# Patient Record
Sex: Female | Born: 1950 | Race: White | Hispanic: No | Smoking: Never smoker
Health system: Southern US, Community
[De-identification: ages and names within clinical notes are randomized; demographics above are authoritative.]

## PROBLEM LIST (undated history)

## (undated) DIAGNOSIS — I499 Cardiac arrhythmia, unspecified: Secondary | ICD-10-CM

## (undated) DIAGNOSIS — M797 Fibromyalgia: Secondary | ICD-10-CM

## (undated) DIAGNOSIS — F329 Major depressive disorder, single episode, unspecified: Secondary | ICD-10-CM

## (undated) DIAGNOSIS — F4310 Post-traumatic stress disorder, unspecified: Secondary | ICD-10-CM

## (undated) DIAGNOSIS — R0600 Dyspnea, unspecified: Secondary | ICD-10-CM

## (undated) DIAGNOSIS — T7840XA Allergy, unspecified, initial encounter: Secondary | ICD-10-CM

## (undated) DIAGNOSIS — F419 Anxiety disorder, unspecified: Secondary | ICD-10-CM

## (undated) DIAGNOSIS — I209 Angina pectoris, unspecified: Secondary | ICD-10-CM

## (undated) DIAGNOSIS — H269 Unspecified cataract: Secondary | ICD-10-CM

## (undated) DIAGNOSIS — C4490 Unspecified malignant neoplasm of skin, unspecified: Secondary | ICD-10-CM

## (undated) DIAGNOSIS — F32A Depression, unspecified: Secondary | ICD-10-CM

## (undated) DIAGNOSIS — R519 Headache, unspecified: Secondary | ICD-10-CM

## (undated) DIAGNOSIS — M1990 Unspecified osteoarthritis, unspecified site: Secondary | ICD-10-CM

## (undated) DIAGNOSIS — R569 Unspecified convulsions: Secondary | ICD-10-CM

## (undated) DIAGNOSIS — R51 Headache: Secondary | ICD-10-CM

## (undated) DIAGNOSIS — H409 Unspecified glaucoma: Secondary | ICD-10-CM

## (undated) DIAGNOSIS — I639 Cerebral infarction, unspecified: Secondary | ICD-10-CM

## (undated) DIAGNOSIS — C539 Malignant neoplasm of cervix uteri, unspecified: Secondary | ICD-10-CM

## (undated) DIAGNOSIS — J45909 Unspecified asthma, uncomplicated: Secondary | ICD-10-CM

## (undated) DIAGNOSIS — N809 Endometriosis, unspecified: Secondary | ICD-10-CM

## (undated) DIAGNOSIS — M719 Bursopathy, unspecified: Secondary | ICD-10-CM

## (undated) DIAGNOSIS — R011 Cardiac murmur, unspecified: Secondary | ICD-10-CM

## (undated) DIAGNOSIS — I10 Essential (primary) hypertension: Secondary | ICD-10-CM

## (undated) HISTORY — DX: Allergy, unspecified, initial encounter: T78.40XA

## (undated) HISTORY — DX: Bursopathy, unspecified: M71.9

## (undated) HISTORY — DX: Cerebral infarction, unspecified: I63.9

## (undated) HISTORY — DX: Post-traumatic stress disorder, unspecified: F43.10

## (undated) HISTORY — PX: CHOLECYSTECTOMY: SHX55

## (undated) HISTORY — DX: Anxiety disorder, unspecified: F41.9

## (undated) HISTORY — PX: OTHER SURGICAL HISTORY: SHX169

## (undated) HISTORY — DX: Fibromyalgia: M79.7

## (undated) HISTORY — DX: Endometriosis, unspecified: N80.9

## (undated) HISTORY — PX: PARTIAL HYSTERECTOMY: SHX80

## (undated) HISTORY — DX: Major depressive disorder, single episode, unspecified: F32.9

## (undated) HISTORY — DX: Unspecified convulsions: R56.9

## (undated) HISTORY — PX: SKIN CANCER EXCISION: SHX779

## (undated) HISTORY — PX: TENDON REPAIR: SHX5111

## (undated) HISTORY — DX: Malignant neoplasm of cervix uteri, unspecified: C53.9

## (undated) HISTORY — DX: Depression, unspecified: F32.A

## (undated) HISTORY — PX: BACK SURGERY: SHX140

## (undated) HISTORY — PX: COLONOSCOPY: SHX174

## (undated) HISTORY — DX: Unspecified cataract: H26.9

## (undated) HISTORY — PX: TUBAL LIGATION: SHX77

## (undated) HISTORY — DX: Cardiac arrhythmia, unspecified: I49.9

## (undated) HISTORY — DX: Unspecified glaucoma: H40.9

---

## 1998-08-14 HISTORY — PX: CERVICAL FUSION: SHX112

## 1998-10-21 ENCOUNTER — Observation Stay (HOSPITAL_COMMUNITY): Admission: RE | Admit: 1998-10-21 | Discharge: 1998-10-22 | Payer: Self-pay | Admitting: Neurosurgery

## 2000-11-07 ENCOUNTER — Encounter: Admission: RE | Admit: 2000-11-07 | Discharge: 2000-11-07 | Payer: Self-pay | Admitting: *Deleted

## 2003-01-30 ENCOUNTER — Encounter: Payer: Self-pay | Admitting: Emergency Medicine

## 2003-01-31 ENCOUNTER — Inpatient Hospital Stay (HOSPITAL_COMMUNITY): Admission: EM | Admit: 2003-01-31 | Discharge: 2003-02-02 | Payer: Self-pay | Admitting: Psychiatry

## 2003-03-02 ENCOUNTER — Emergency Department (HOSPITAL_COMMUNITY): Admission: EM | Admit: 2003-03-02 | Discharge: 2003-03-02 | Payer: Self-pay | Admitting: Emergency Medicine

## 2003-03-02 ENCOUNTER — Encounter: Payer: Self-pay | Admitting: Emergency Medicine

## 2003-08-12 ENCOUNTER — Emergency Department (HOSPITAL_COMMUNITY): Admission: EM | Admit: 2003-08-12 | Discharge: 2003-08-12 | Payer: Self-pay | Admitting: Internal Medicine

## 2003-12-04 ENCOUNTER — Ambulatory Visit (HOSPITAL_COMMUNITY): Admission: RE | Admit: 2003-12-04 | Discharge: 2003-12-04 | Payer: Self-pay | Admitting: Internal Medicine

## 2006-02-01 ENCOUNTER — Encounter: Payer: Self-pay | Admitting: Gastroenterology

## 2007-01-03 ENCOUNTER — Encounter: Admission: RE | Admit: 2007-01-03 | Discharge: 2007-02-01 | Payer: Self-pay | Admitting: Orthopedic Surgery

## 2007-02-21 ENCOUNTER — Encounter: Admission: RE | Admit: 2007-02-21 | Discharge: 2007-02-21 | Payer: Self-pay | Admitting: Orthopedic Surgery

## 2007-03-18 ENCOUNTER — Encounter: Admission: RE | Admit: 2007-03-18 | Discharge: 2007-04-22 | Payer: Self-pay | Admitting: Orthopedic Surgery

## 2008-09-23 ENCOUNTER — Ambulatory Visit: Payer: Self-pay | Admitting: Cardiology

## 2009-04-21 ENCOUNTER — Encounter: Admission: RE | Admit: 2009-04-21 | Discharge: 2009-07-20 | Payer: Self-pay | Admitting: Orthopedic Surgery

## 2009-10-20 ENCOUNTER — Encounter (INDEPENDENT_AMBULATORY_CARE_PROVIDER_SITE_OTHER): Payer: Self-pay | Admitting: *Deleted

## 2009-11-11 ENCOUNTER — Encounter: Admission: RE | Admit: 2009-11-11 | Discharge: 2010-02-09 | Payer: Self-pay | Admitting: Anesthesiology

## 2009-11-17 ENCOUNTER — Encounter (INDEPENDENT_AMBULATORY_CARE_PROVIDER_SITE_OTHER): Payer: Self-pay | Admitting: *Deleted

## 2009-11-17 ENCOUNTER — Ambulatory Visit: Payer: Self-pay | Admitting: Gastroenterology

## 2009-11-17 DIAGNOSIS — R197 Diarrhea, unspecified: Secondary | ICD-10-CM | POA: Insufficient documentation

## 2009-11-18 LAB — CONVERTED CEMR LAB
Albumin: 4.4 g/dL (ref 3.5–5.2)
Basophils Relative: 0.7 % (ref 0.0–3.0)
CO2: 33 meq/L — ABNORMAL HIGH (ref 19–32)
Eosinophils Relative: 0.8 % (ref 0.0–5.0)
GFR calc non Af Amer: 78.25 mL/min (ref >60)
Glucose, Bld: 85 mg/dL (ref 70–99)
HCT: 36.1 % (ref 36.0–46.0)
Lymphs Abs: 2.2 10*3/uL (ref 0.7–4.0)
Monocytes Relative: 5.5 % (ref 3.0–12.0)
Neutrophils Relative %: 53.3 % (ref 43.0–77.0)
Platelets: 313 10*3/uL (ref 150.0–400.0)
Potassium: 3.8 meq/L (ref 3.5–5.1)
RBC: 4.11 M/uL (ref 3.87–5.11)
Sodium: 138 meq/L (ref 135–145)
TSH: 0.81 microintl units/mL (ref 0.35–5.50)
Total Protein: 7.5 g/dL (ref 6.0–8.3)
WBC: 5.5 10*3/uL (ref 4.5–10.5)

## 2009-12-16 ENCOUNTER — Ambulatory Visit (HOSPITAL_COMMUNITY): Admission: RE | Admit: 2009-12-16 | Discharge: 2009-12-16 | Payer: Self-pay | Admitting: Gastroenterology

## 2009-12-16 ENCOUNTER — Ambulatory Visit: Payer: Self-pay | Admitting: Gastroenterology

## 2010-01-04 ENCOUNTER — Telehealth (INDEPENDENT_AMBULATORY_CARE_PROVIDER_SITE_OTHER): Payer: Self-pay | Admitting: *Deleted

## 2010-02-16 ENCOUNTER — Encounter: Payer: Self-pay | Admitting: Gastroenterology

## 2010-04-21 ENCOUNTER — Telehealth (INDEPENDENT_AMBULATORY_CARE_PROVIDER_SITE_OTHER): Payer: Self-pay | Admitting: *Deleted

## 2010-04-25 ENCOUNTER — Encounter (INDEPENDENT_AMBULATORY_CARE_PROVIDER_SITE_OTHER): Payer: Self-pay | Admitting: *Deleted

## 2010-09-13 NOTE — Letter (Signed)
Summary: New Patient letter  [facility]  [address] [place], [place] [address]   Phone: [phone]  Fax: [phone]       10/20/2009 MRN: [id number]  Carla Tran [address] [place], [place]  [address]  Dear Ms. Teague,  Welcome to the Gastroenterology Division at [facility].    You are scheduled to see Dr.  Rob Bunting on November 17, 2009 at 1:30pm on the 3rd floor at [facility], [address]  We ask that you try to arrive at our office 15 minutes prior to your appointment time to allow for check-in.  We would like you to complete the enclosed self-administered evaluation form prior to your visit and bring it with you on the day of your appointment.  We will review it with you.  Also, please bring a complete list of all your medications or, if you prefer, bring the medication bottles and we will list them.  Please bring your insurance card so that we may make a copy of it.  If your insurance requires a referral to see a specialist, please bring your referral form from your primary care physician.  Co-payments are due at the time of your visit and may be paid by cash, check or credit card.     Your office visit will consist of a consult with your physician (includes a physical exam), any laboratory testing he/she may order, scheduling of any necessary diagnostic testing (e.g. x-ray, ultrasound, CT-scan), and scheduling of a procedure (e.g. Endoscopy, Colonoscopy) if required.  Please allow enough time on your schedule to allow for any/all of these possibilities.    If you cannot keep your appointment, please call [phone] to cancel or reschedule prior to your appointment date.  This allows [place] the opportunity to schedule an appointment for another patient in need of care.  If you do not cancel or reschedule by 5 p.m. the business day prior to your appointment date, you will be charged a $50.00 late cancellation/no-show fee.    Thank you for  choosing [facility] for your medical needs.  We appreciate the opportunity to care for you.  Please visit [place] at our website  to learn more about our practice.                     Sincerely,                                                             The Gastroenterology Division

## 2010-09-13 NOTE — Procedures (Signed)
Summary: Instructions for procedure/[facility] (out pt)  Instructions for procedure/[facility] (out pt)   Imported By: Sherian Rein 11/29/2009 07:15:17  _____________________________________________________________________  External Attachment:    Type:   Image     Comment:   External Document

## 2010-09-13 NOTE — Letter (Signed)
Summary: [facility] Instructions  [facility]  [address] [place], [place] [address]   Phone: [phone]  Fax: [phone]       Carla Tran    1951-08-07    MRN: [id number]        Procedure Day /Date:12/16/09  THURS     Arrival Time:830 am     Procedure Time:1030 am     Location of Procedure:                     X  [facility] ( Outpatient Registration)                        PREPARATION FOR COLONOSCOPY WITH MOVIPREP   Starting 5 days prior to your procedure 12/11/09 do not eat nuts, seeds, popcorn, corn, beans, peas,  salads, or any raw vegetables.  Do not take any fiber supplements (e.g. Metamucil, Citrucel, and Benefiber).  THE DAY BEFORE YOUR PROCEDURE         DATE: 12/15/09  DAY: WED  1.  Drink clear liquids the entire day-NO SOLID FOOD  2.  Do not drink anything colored red or purple.  Avoid juices with pulp.  No orange juice.  3.  Drink at least 64 oz. (8 glasses) of fluid/clear liquids during the day to prevent dehydration and help the prep work efficiently.  CLEAR LIQUIDS INCLUDE: Water Jello Ice Popsicles Tea (sugar ok, no milk/cream) Powdered fruit flavored drinks Coffee (sugar ok, no milk/cream) Gatorade Juice: apple, white grape, white cranberry  Lemonade Clear bullion, consomm, broth Carbonated beverages (any kind) Strained chicken noodle soup Hard Candy                             4.  In the morning, mix first dose of MoviPrep solution:    Empty 1 Pouch A and 1 Pouch B into the disposable container    Add lukewarm drinking water to the top line of the container. Mix to dissolve    Refrigerate (mixed solution should be used within 24 hrs)  5.  Begin drinking the prep at 5:00 p.m. The MoviPrep container is divided by 4 marks.   Every 15 minutes drink the solution down to the next mark (approximately 8 oz) until the full liter is complete.   6.  Follow completed prep with 16 oz of clear liquid of your choice (Nothing  red or purple).  Continue to drink clear liquids until bedtime.  7.  Before going to bed, mix second dose of MoviPrep solution:    Empty 1 Pouch A and 1 Pouch B into the disposable container    Add lukewarm drinking water to the top line of the container. Mix to dissolve    Refrigerate  THE DAY OF YOUR PROCEDURE      DATE: 12/16/09 DAY: THURS  Beginning at 530 a.m. (5 hours before procedure):         1. Every 15 minutes, drink the solution down to the next mark (approx 8 oz) until the full liter is complete.  2. Follow completed prep with 16 oz. of clear liquid of your choice.    3. Nothing to eat or drink after midnight except prep solution.   MEDICATION INSTRUCTIONS  Unless otherwise instructed, you should take regular prescription medications with a small sip of water   as early as possible the morning of your procedure.  OTHER INSTRUCTIONS  You will need a responsible adult at least 59 years of age to accompany you and drive you home.   This person must remain in the waiting room during your procedure.  Wear loose fitting clothing that is easily removed.  Leave jewelry and other valuables at home.  However, you may wish to bring a book to read or  an iPod/MP3 player to listen to music as you wait for your procedure to start.  Remove all body piercing jewelry and leave at home.  Total time from sign-in until discharge is approximately 2-3 hours.  You should go home directly after your procedure and rest.  You can resume normal activities the  day after your procedure.  The day of your procedure you should not:   Drive   Make legal decisions   Operate machinery   Drink alcohol   Return to work  You will receive specific instructions about eating, activities and medications before you leave.    The above instructions have been reviewed and explained to me by   _______________________    I fully understand and can verbalize these instructions  _____________________________ Date _________

## 2010-09-13 NOTE — Procedures (Signed)
Summary: Colonoscopy/[facility]   Imported By: Sherian Rein 11/29/2009 07:57:34  _____________________________________________________________________  External Attachment:    Type:   Image     Comment:   External Document

## 2010-09-13 NOTE — Progress Notes (Signed)
  Phone Note Other Incoming   Request: Send information Summary of Call: Request for records received from [facility]. Request forwarded to [employer].

## 2010-09-13 NOTE — Assessment & Plan Note (Signed)
History of Present Illness Visit Type: Initial Consult Primary GI MD: Rob Bunting MD Primary Provider: Flossie Dibble, MD Requesting Provider: Karen Kays, MD Chief Complaint: ibs History of Present Illness:     60 year old woman who was sent by pain managment clinic.  She has had IBS like  symptoms for several years.  Very loose stools.  Currently she is on pain medicines for arm, back pain.  For these pains she is on hydrocodone 7.5mg  about two times a day to three times a day.  She also takes   she had a colonoscopy with Dr. Cleotis Nipper (one was in 01/2006, another further back than that.  She's never had polyps in her colon.  No colon cancer in family.  She thinks she would have 7 loose stools a day without narcotic meds or lomotil.  Never sees blood in stool.  She used to take lomotil 2 pills a day.  Her bowels were easier to control.  She currently takes about 6 immodium a day.  She had diarrhea for at least 10 years.  she has gained a about 20-25 pounds in the past year.           Current Medications (verified): 1)  Phenobarbital 60 Mg Tabs (Phenobarbital) .... 2 By Mouth Once Daily 2)  Gabapentin 100 Mg Caps (Gabapentin) .... 3 By Mouth Once Daily 3)  Cymbalta 30 Mg Cpep (Duloxetine Hcl) .... 2 By Mouth Once Daily 4)  Cyclobenzaprine Hcl 10 Mg Tabs (Cyclobenzaprine Hcl) .... 3 By Mouth Once Daily 5)  Fexofenadine Hcl 180 Mg Tabs (Fexofenadine Hcl) .Marland Kitchen.. 1 By Mouth Once Daily 6)  Xanax 1 Mg Tabs (Alprazolam) .Marland Kitchen.. 1 By Mouth Three Times A Day 7)  Voltaren 1 % Gel (Diclofenac Sodium) .... As Directed 8)  Pataday 0.2 % Soln (Olopatadine Hcl) .... As Needed 9)  Hydrocodone-Acetaminophen 7.5-325 Mg Tabs (Hydrocodone-Acetaminophen) .... Three Times A Day As Needed 10)  Cyclobenzaprine Hcl 10 Mg Tabs (Cyclobenzaprine Hcl) .... Three Times A Day  Allergies (verified): 1)  ! Penicillin 2)  ! Sulfa 3)  ! Codeine 4)  ! Percocet 5)  ! * Ivp Dye  Past History:  Past  Medical History: Irritable Bowel Syndrome Alcoholism, remote  Past Surgical History: cryo surgery Cholecystectomy Back Surgery Right arm surgery Tubal Ligation   Social History: drinks 4-5 cups of coffee and periodic tea, periodic soda no alcohol  Review of Systems       Pertinent positive and negative review of systems were noted in the above HPI and GI specific review of systems.  All other review of systems was otherwise negative.   Vital Signs:  Patient profile:   60 year old female Height:      61 inches Weight:      147.50 pounds BMI:     27.97 Pulse rate:   76 / minute Pulse rhythm:   regular BP sitting:   110 / 82  (left arm) Cuff size:   regular  Vitals Entered By: June McMurray CMA Duncan Dull) (November 17, 2009 1:28 PM)  Physical Exam  Additional Exam:  Constitutional: generally well appearing Psychiatric: alert and oriented times 3 Eyes: extraocular movements intact Mouth: oropharynx moist, no lesions Neck: supple, no lymphadenopathy Cardiovascular: heart regular rate and rythm Lungs: CTA bilaterally Abdomen: soft, non-tender, non-distended, no obvious ascites, no peritoneal signs, normal bowel sounds Extremities: no lower extremity edema bilaterally Skin: no lesions on visible extremities    Impression & Recommendations:  Problem # 1:  Chronic diarrhea she has had chronic loose stools for 8-10 years. She has undergone a colonoscopy in 2007 but I do not see that any random biopsies were taken nor was her terminal ileum intubated. There is still a possibility that she has microscopic colitis.other possibilities include hyperthyroidism, celiac sprue. I think we should repeat her colonoscopy which we will arrange be done with anesthesia's assistance given her previous sedation issue. My plan will be to perform random biopsies her colon even if it is normal. We'll also plan to intubate the terminal ileum. For her chronic loose stools a recommended she start taking  Imodium twice daily, 2 pills each time, on a scheduled basis. She should not wait until diarrhea occurs. She she'll hold this if she becomes constipated. She is taking chronic pain medicines, narcotic. This is probably also helping her loose stools. She has no abdominal pains.  Other Orders: TLB-CBC Platelet - w/Differential (85025-CBCD) TLB-CMP (Comprehensive Metabolic Pnl) (80053-COMP) TLB-TSH (Thyroid Stimulating Hormone) (84443-TSH) TLB-Sedimentation Rate (ESR) (85652-ESR) T-Tissue Transglutamase Ab IgA (04540-98119) TLB-IgA (Immunoglobulin A) (82784-IGA)  Patient Instructions: 1)  You will be scheduled to have a colonoscopy at [facility] hospital with propofol given your previous trouble with sedation. 2)  You will get lab test(s) done today (cbc, cmet, esr, tsh, tTG, total IgA). 3)  Change your immodium so that you take 2 pills every morning immediatly after waking up and then again (2 more pils) around lunch.  Immodium is safe up to 8 pills a day. 4)  A copy of this information will be sent to Dr. Rexene Edison.   and pain management Dr. Olena Leatherwood.  Appended Document: Orders Update/movi    Clinical Lists Changes  Medications: Added new medication of MOVIPREP 100 GM  SOLR (PEG-KCL-NACL-NASULF-NA ASC-C) As per prep instructions. - Signed Rx of MOVIPREP 100 GM  SOLR (PEG-KCL-NACL-NASULF-NA ASC-C) As per prep instructions.;  #1 x 0;  Signed;  Entered by: Chales Abrahams CMA (AAMA);  Authorized by: Rachael Fee MD;  Method used: Electronically to [facility] [phone]*, [address] [place], [place], [place]  [address], Ph: [phone] or [phone], Fax: [phone] Orders: Added new Test order of ZCOL (ZCOL) - Signed    Prescriptions: MOVIPREP 100 GM  SOLR (PEG-KCL-NACL-NASULF-NA ASC-C) As per prep instructions.  #1 x 0   Entered by:   Chales Abrahams CMA (AAMA)   Authorized by:   Rachael Fee MD   Signed by:   Chales Abrahams CMA (AAMA) on 11/17/2009   Method used:   Electronically to         [facility] [phone]* (retail)       [address] [place], [place]  [address]       Ph: [phone] or [phone]       Fax: [phone]   RxID:   [id number]

## 2010-09-13 NOTE — Medication Information (Signed)
Summary: Lomotil/[facility]  Lomotil/[facility]   Imported By: Sherian Rein 02/28/2010 14:09:55  _____________________________________________________________________  External Attachment:    Type:   Image     Comment:   External Document

## 2010-09-13 NOTE — Procedures (Signed)
Summary: Colonoscopy  Patient: Carla Tran Note: All result statuses are Final unless otherwise noted.  Tests: (1) Colonoscopy (COL)   COL Colonoscopy           DONE     [facility]     [address] [place], [place]  [address]           COLONOSCOPY PROCEDURE REPORT           PATIENT:  Carla, Tran  MR#:  [id number]     BIRTHDATE:  October 06, 1950, 59 yrs. old  GENDER:  female     ENDOSCOPIST:  Rachael Fee, MD     PROCEDURE DATE:  12/16/2009     PROCEDURE:  Colonoscopy with biopsy     ASA CLASS:  Class III     INDICATIONS:  chronic diarrhea     MEDICATIONS:   MAC sedation, administered by CRNA     DESCRIPTION OF PROCEDURE:   After the risks benefits and     alternatives of the procedure were thoroughly explained, informed     consent was obtained.  Digital rectal exam was performed and     revealed no rectal masses.   The  endoscope was introduced through     the anus and advanced to the cecum, which was identified by both     the appendix and ileocecal valve, limited by poor preparation.     The quality of the prep was poor, using MoviPrep.  The instrument was     then slowly withdrawn as the colon was fully     examined.<<PROCEDUREIMAGES>>     FINDINGS:  External hemorrhoids were found. These were small, not     thrombosed.  The prep was not adequate to allow appropriate     inspection of the mucosa. (see image1 and image2).  This was     otherwise a normal examination of the colon (see image3 and     image5).   Retroflexed views in the rectum revealed no     abnormalities.    The scope was then withdrawn from the patient     and the procedure completed.           COMPLICATIONS:  None           ENDOSCOPIC IMPRESSION:     1) Small external hemorrhoids     2) Poor prep limited this examination greatly     3) Otherwise normal examination; random biopsies were taken from     colon and sent to pathology.           RECOMMENDATIONS:     Await biopsy report  for final recommendations.     Likely should have a repeat colonoscopy in next 3-4 months given     poor prep today (could have missed significant lesions).           ______________________________     Rachael Fee, MD           n.     eSIGNED:   Rachael Fee at 12/16/2009 12:46 PM           [name], [id number]  Note: An exclamation mark (!) indicates a result that was not dispersed into the flowsheet. Document Creation Date: 12/16/2009 12:47 PM _______________________________________________________________________  (1) Order result status: Final Collection or observation date-time: 12/16/2009 12:29 Requested date-time:  Receipt date-time:  Reported date-time:  Referring Physician:   Ordering Physician: Rob Bunting [phone]) Specimen Source:  Source: Launa Grill Order Number: [id number] Lab site:

## 2010-09-13 NOTE — Letter (Signed)
Summary: Appointment Reminder   Gastroenterology  [address] [place], [place] [address]   Phone: [phone]  Fax: [phone]        April 25, 2010 MRN: [id number]    Carla Tran [address] [place], [place]  [address]    Dear Ms. Palmisano,   We have been unable to reach you by phone to schedule a follow up   procedure that was recommended for you by Dr. Christella Hartigan. It is very   important that we reach you to schedule an appointment. We hope that you  allow [place] to participate in your health care needs. Please contact us at  [phone] at your earliest convenience to schedule your appointment.     Sincerely,    Chales Abrahams CMA (AAMA)  Appended Document: Appointment Reminder letter mailed

## 2010-09-13 NOTE — Progress Notes (Signed)
Summary: repeat procedure  Phone Note Outgoing Call   Call placed by: Chales Abrahams CMA Duncan Dull),  April 21, 2010 1:07 PM Summary of Call: pt needs repeat procedure left message on machine to call back  Initial call taken by: Chales Abrahams CMA Duncan Dull),  April 21, 2010 1:07 PM  Follow-up for Phone Call        left message on machine to call back letter mailed Follow-up by: Chales Abrahams CMA Duncan Dull),  April 25, 2010 10:35 AM

## 2010-09-15 ENCOUNTER — Telehealth: Payer: Self-pay | Admitting: Gastroenterology

## 2010-09-21 NOTE — Progress Notes (Signed)
Summary: prep ?  Phone Note Call from Patient Call back at Home Phone [phone]   Caller: Patient Call For: Dr Christella Hartigan Reason for Call: Talk to Nurse Summary of Call: Patient wants to speak to nurse regarding new prep she needs prescribed Initial call taken by: Tawni Levy,  September 15, 2010 12:34 PM  Follow-up for Phone Call        pt has been scheduled for colon at [facility] with special prep she has previsit appt scheduled as well.  Pt would like to have lomotil refilled to last until colon.  Dr Christella Hartigan is this ok? Follow-up by: Chales Abrahams CMA Duncan Dull),  September 15, 2010 1:00 PM  Additional Follow-up for Phone Call Additional follow up Details #1::        yes, that is ok Additional Follow-up by: Rachael Fee MD,  September 15, 2010 1:45 PM    Prescriptions: LOMOTIL 2.5-0.025 MG TABS (DIPHENOXYLATE-ATROPINE) 1 by mouth every 5-6 hours as needed  #90 x 0   Entered by:   Chales Abrahams CMA (AAMA)   Authorized by:   Rachael Fee MD   Signed by:   Chales Abrahams CMA (AAMA) on 09/15/2010   Method used:   Printed then faxed to ...       [facility] [phone]* (retail)       [address] [place], [place]  [address]       Ph: [phone] or [phone]       Fax: [phone]   RxID:   [id number]  pt aware rx faxed

## 2010-10-03 ENCOUNTER — Encounter (INDEPENDENT_AMBULATORY_CARE_PROVIDER_SITE_OTHER): Payer: Self-pay | Admitting: *Deleted

## 2010-10-07 ENCOUNTER — Encounter: Payer: Self-pay | Admitting: Gastroenterology

## 2010-10-11 NOTE — Miscellaneous (Signed)
Summary: [facility]  Clinical Lists Changes  Medications: Added new medication of MOVIPREP 100 GM  SOLR (PEG-KCL-NACL-NASULF-NA ASC-C) As per prep instructions. - Signed Rx of MOVIPREP 100 GM  SOLR (PEG-KCL-NACL-NASULF-NA ASC-C) As per prep instructions.;  #1 x 0;  Signed;  Entered by: Ezra Sites RN;  Authorized by: Rachael Fee MD;  Method used: Electronically to [facility] [phone]*, [address] [place], [place], [place]  [address], Ph: [phone] or [phone], Fax: [phone] Allergies: Removed allergy or adverse reaction of PERCOCET    Prescriptions: MOVIPREP 100 GM  SOLR (PEG-KCL-NACL-NASULF-NA ASC-C) As per prep instructions.  #1 x 0   Entered by:   Ezra Sites RN   Authorized by:   Rachael Fee MD   Signed by:   Ezra Sites RN on 10/07/2010   Method used:   Electronically to        [facility] [phone]* (retail)       [address] [place], [place]  [address]       Ph: [phone] or [phone]       Fax: [phone]   RxID:   [id number]

## 2010-10-11 NOTE — Letter (Signed)
Summary: Moviprep Instructions  [facility]  [address]   [place], [place] [address]   Phone: [phone]  Fax: [phone]       Carla Tran    01-10-1951    MRN: [id number]        Procedure Day Dorna Bloom: Thursday, 10-13-10     Arrival Time: 7:15 a.m.     Procedure Time: 9:15 a.m.     Location of Procedure:                     x   [facility] ( Outpatient Registration)  PREPARATION FOR COLONOSCOPY WITH MOVIPREP   Starting 5 days prior to your procedure 10-08-10 do not eat nuts, seeds, popcorn, corn, beans, peas,  salads, or any raw vegetables.  Do not take any fiber supplements (e.g. Metamucil, Citrucel, and Benefiber).   TWO DAYS BEFORE YOUR PROCEDURE     DATE: 10-11-10  DAY: TUESDAY  DRINK FULL LIQUIDS THE ENTIRE DAY TAKE 3 SCOOPS OF MIRALAX IN THE A.M. AND 3 SCOOPS IN THE P.M.    THE DAY BEFORE YOUR PROCEDURE         DATE: 10-12-10  DAY: Wednesday  1.  Drink clear liquids the entire day-NO SOLID FOOD  2.  Do not drink anything colored red or purple.  Avoid juices with pulp.  No orange juice.  3.  Drink at least 64 oz. (8 glasses) of fluid/clear liquids during the day to prevent dehydration and help the prep work efficiently.  CLEAR LIQUIDS INCLUDE: Water Jello Ice Popsicles Tea (sugar ok, no milk/cream) Powdered fruit flavored drinks Coffee (sugar ok, no milk/cream) Gatorade Juice: apple, white grape, white cranberry  Lemonade Clear bullion, consomm, broth Carbonated beverages (any kind) Strained chicken noodle soup Hard Candy                             4.  In the morning, mix first dose of MoviPrep solution:    Empty 1 Pouch A and 1 Pouch B into the disposable container    Add lukewarm drinking water to the top line of the container. Mix to dissolve    Refrigerate (mixed solution should be used within 24 hrs)  5.  Begin drinking the prep at 5:00 p.m. The MoviPrep container is divided by 4 marks.   Every 15 minutes drink the  solution down to the next mark (approximately 8 oz) until the full liter is complete.   6.  Follow completed prep with 16 oz of clear liquid of your choice (Nothing red or purple).  Continue to drink clear liquids until bedtime.  7.  Before going to bed, mix second dose of MoviPrep solution:    Empty 1 Pouch A and 1 Pouch B into the disposable container    Add lukewarm drinking water to the top line of the container. Mix to dissolve    Refrigerate  THE DAY OF YOUR PROCEDURE      DATE: 10-13-10  DAY: Thursday  Beginning at 4:15 a.m.  (5 hours before procedure):         1. Every 15 minutes, drink the solution down to the next mark (approx 8 oz) until the full liter is complete.  2. Follow completed prep with 16 oz. of clear liquid of your choice.    3. Nothing to eat or drink after midnight (with the exception of your prep)   MEDICATION INSTRUCTIONS  Unless otherwise  instructed, you should take regular prescription medications with a small sip of water   as early as possible the morning of your procedure.           OTHER INSTRUCTIONS  You will need a responsible adult at least 59 years of age to accompany you and drive you home.   This person must remain in the waiting room during your procedure.  Wear loose fitting clothing that is easily removed.  Leave jewelry and other valuables at home.  However, you may wish to bring a book to read or  an iPod/MP3 player to listen to music as you wait for your procedure to start.  Remove all body piercing jewelry and leave at home.  Total time from sign-in until discharge is approximately 2-3 hours.  You should go home directly after your procedure and rest.  You can resume normal activities the  day after your procedure.  The day of your procedure you should not:   Drive   Make legal decisions   Operate machinery   Drink alcohol   Return to work  You will receive specific instructions about eating, activities and  medications before you leave.    The above instructions have been reviewed and explained to me by   Ezra Sites RN  October 07, 2010 10:54 AM    I fully understand and can verbalize these instructions _____________________________ Date _________

## 2010-10-13 ENCOUNTER — Ambulatory Visit (HOSPITAL_COMMUNITY)
Admission: RE | Admit: 2010-10-13 | Discharge: 2010-10-13 | Disposition: A | Discharge status: Home / Self Care | Payer: PRIVATE HEALTH INSURANCE | Source: Ambulatory Visit | Attending: Gastroenterology | Admitting: Gastroenterology

## 2010-10-13 ENCOUNTER — Other Ambulatory Visit: Payer: Medicare Other | Admitting: Gastroenterology

## 2010-10-13 ENCOUNTER — Encounter: Payer: Self-pay | Admitting: Gastroenterology

## 2010-10-13 DIAGNOSIS — R197 Diarrhea, unspecified: Secondary | ICD-10-CM | POA: Insufficient documentation

## 2010-10-13 DIAGNOSIS — K644 Residual hemorrhoidal skin tags: Secondary | ICD-10-CM

## 2010-10-13 DIAGNOSIS — K648 Other hemorrhoids: Secondary | ICD-10-CM | POA: Insufficient documentation

## 2010-10-20 NOTE — Procedures (Signed)
Summary: Colonoscopy  Patient: [name] Note: All result statuses are Final unless otherwise noted.  Tests: (1) Colonoscopy (COL)   COL Colonoscopy           DONE     [facility]     [address] [place], [place]  [address]           COLONOSCOPY PROCEDURE REPORT           PATIENT:  Carla Tran, Carla Tran  MR#:  [id number]     BIRTHDATE:  [birth year], 60 yrs. old  GENDER:  female     ENDOSCOPIST:  Rachael Fee, MD     PROCEDURE DATE:  10/13/2010     PROCEDURE:  Diagnostic Colonoscopy     ASA CLASS:  Class II     INDICATIONS:  chronic loose stools; very poorly prepped     colonsocopy 2011 but random biopsies showed no colitis     MEDICATIONS:   MAC sedation, administered by CRNA           DESCRIPTION OF PROCEDURE:   After the risks benefits and     alternatives of the procedure were thoroughly explained, informed     consent was obtained.  Digital rectal exam was performed and     revealed no rectal masses.   The EC-[id number] ([id number]) endoscope was     introduced through the anus and advanced to the terminal ileum     which was intubated for a short distance, without limitations.     The quality of the prep was good, using MoviPrep.  The instrument     was then slowly withdrawn as the colon was fully examined.     <<PROCEDUREIMAGES>>     FINDINGS:  Internal and external Hemorrhoids were found. These     were small.  This was otherwise a normal examination of the colon     (see image001, image002, and image005).  The terminal ileum     appeared normal (see image003).   Retroflexed views in the rectum     revealed no abnormalities.    The scope was then withdrawn from     the patient and the procedure completed.     COMPLICATIONS:  None           ENDOSCOPIC IMPRESSION:     1) Internal and external hemorrhoids     2) Otherwise normal examination     3) Normal terminal ileum           RECOMMENDATIONS:     Continue twice daily lomotil, it seems to be helping your  loose     stools very well.     You should have repeat colonoscopy for colon cancer screening in     10 years.           ______________________________     Rachael Fee, MD           n.     eSIGNED:   Rachael Fee at 10/13/2010 09:39 AM           [name], [id number]  Note: An exclamation mark (!) indicates a result that was not dispersed into the flowsheet. Document Creation Date: 10/13/2010 9:40 AM _______________________________________________________________________  (1) Order result status: Final Collection or observation date-time: 10/13/2010 09:33 Requested date-time:  Receipt date-time:  Reported date-time:  Referring Physician:   Ordering Physician: Rob Bunting [phone]) Specimen Source:  Source: Launa Grill Order Number: [id number] Lab site:

## 2010-10-20 NOTE — Procedures (Signed)
Summary: Colon Prep/[facility]  Colon Prep/ GI   Imported By: Lester Hendricks 10/10/2010 10:39:42  _____________________________________________________________________  External Attachment:    Type:   Image     Comment:   External Document

## 2010-11-22 ENCOUNTER — Ambulatory Visit (HOSPITAL_COMMUNITY): Payer: Self-pay | Admitting: Psychiatry

## 2010-11-25 ENCOUNTER — Telehealth: Payer: Self-pay | Admitting: Gastroenterology

## 2010-11-25 NOTE — Telephone Encounter (Signed)
Pt of Dr Christella Hartigan with a hx of chronic diarrhea. The last 2 COLONs, 12/16/09 and 10/13/10 showed internal and external hemorrhoids, otherwise normal. Pt reports she's had diarrhea since the COLON on 10/13/10. She had been eating oatmeal only and the diarrhea was controlled, but when she tried to advance her diet the diarrhea began. She reports the diarrhea is watery and denies blood. Pt stated she is only drinking today; suggested the BRAT diet to her. Per Dr Christella Hartigan' notes on COLON, she can take Lomotil BID. She took 1 Lomotil this am and 2 more with the next stool. She also bought Imodium.  Please advise on Lomotil dose or other recommendations.] Thanks.

## 2010-11-25 NOTE — Telephone Encounter (Signed)
Pt reports she hasn't had any diarrhea since she took the last 2 Lomotil- she hasn't eaten either. Pt encouraged to follow the BRAt diet and take the lomotil bid this weekend. Call back Monday if she still has a problem. Pt verbalized understanding.

## 2010-11-25 NOTE — Telephone Encounter (Signed)
Agree with Dr. Christella Hartigan recommendations regarding Lomotil dosage. She should try this over the weekend, and is still having difficulties, may contact the office and get further advice from Dr. Christella Hartigan on Monday. Thanks

## 2010-12-06 ENCOUNTER — Other Ambulatory Visit: Payer: Self-pay | Admitting: Gastroenterology

## 2010-12-06 DIAGNOSIS — R197 Diarrhea, unspecified: Secondary | ICD-10-CM

## 2010-12-06 MED ORDER — DIPHENOXYLATE-ATROPINE 2.5-0.025 MG PO TABS: 1.0000 | ORAL_TABLET | Freq: Two times a day (BID) | ORAL | Status: DC

## 2010-12-06 NOTE — Telephone Encounter (Signed)
Dr Jacobs do you want to refill? 

## 2010-12-06 NOTE — Telephone Encounter (Signed)
One lomotil, twice daily.  Disp 60, refill 11
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
## 2011-04-05 ENCOUNTER — Ambulatory Visit (HOSPITAL_COMMUNITY): Payer: Medicare Other | Admitting: Psychiatry

## 2011-07-14 ENCOUNTER — Other Ambulatory Visit: Payer: Self-pay | Admitting: Gastroenterology

## 2012-02-02 ENCOUNTER — Other Ambulatory Visit: Payer: Self-pay | Admitting: Gastroenterology

## 2012-02-02 MED ORDER — DIPHENOXYLATE-ATROPINE 2.5-0.025 MG PO TABS: 1.0000 | ORAL_TABLET | Freq: Two times a day (BID) | ORAL | Status: DC

## 2012-02-02 NOTE — Telephone Encounter (Signed)
Yes, she can have another 1 year of lomotil, one pill twice daily, discp 60, 11 refills.  She needs rov in 1 year.

## 2012-02-02 NOTE — Telephone Encounter (Signed)
Can we refill? 

## 2012-02-02 NOTE — Telephone Encounter (Signed)
Pt aware medication prescription available to pick up at the front desk.

## 2012-08-21 ENCOUNTER — Telehealth: Payer: Self-pay

## 2012-08-21 NOTE — Telephone Encounter (Signed)
Chales Abrahams, CMA 02/02/2012 1:29 PM Signed  Pt aware medication prescription available to pick up at the front desk. Rob Bunting, MD 02/02/2012 1:24 PM Signed  Yes, she can have another 1 year of lomotil, one pill twice daily, discp 60, 11 refills. She needs rov in 1 year. Chales Abrahams, CMA 02/02/2012 1:17 PM Signed  Can we refill?          Routing History       Priority  Sent On  From  To  Message Type     02/02/2012 1:24 PM  Rachael Fee, MD  Donata Duff, CMA       02/02/2012 1:17 PM  Jossilyn Benda Luretha Rued, CMA  Rachael Fee, MD       02/02/2012 12:05 PM  Trinna Balloon, CMA         Created by     Holli Humbles on 02/02/2012 12:05 PM            Approved        Disp  Refills  Start  End    diphenoxylate-atropine (LOMOTIL) 2.5-0.025 MG per tablet  60 tablet  11  02/02/2012      Sig - Route: Take 1 tablet by mouth 2 (two) times daily. - Oral    Class: Print    Authorizing Provider: Rachael Fee, MD    Ordering User: Donata Duff, CMA                      Visit Pharmacy     [id number] - [place], [place] - [address]            Contacts         Type  Contact  Phone    02/02/2012 12:05 PM  Phone (Incoming)  Janayia, Burggraf (Self)  [phone] (H)    Needs a refill on her Lomotil [facility]          Encounter Messages     No messages in this encounter

## 2012-08-21 NOTE — Telephone Encounter (Signed)
Pt rx was at the front desk for pick up since June and was not picked up I tried to call the pt but number was disconnected

## 2012-11-25 ENCOUNTER — Other Ambulatory Visit: Payer: Self-pay | Admitting: *Deleted

## 2013-03-04 ENCOUNTER — Telehealth: Payer: Self-pay | Admitting: Gastroenterology

## 2013-03-04 NOTE — Telephone Encounter (Signed)
Pt was notified she would need an appt before any refills can be given.  I has been 2 years since she was seen last.  Pt agreed and wants to call back to schedule.  No refills given

## 2014-04-19 ENCOUNTER — Emergency Department (HOSPITAL_COMMUNITY)
Admission: EM | Admit: 2014-04-19 | Discharge: 2014-04-19 | Disposition: A | Discharge status: Home / Self Care | Payer: PRIVATE HEALTH INSURANCE | Attending: Emergency Medicine | Admitting: Emergency Medicine

## 2014-04-19 ENCOUNTER — Emergency Department (HOSPITAL_COMMUNITY): Payer: PRIVATE HEALTH INSURANCE

## 2014-04-19 ENCOUNTER — Encounter (HOSPITAL_COMMUNITY): Payer: Self-pay | Admitting: Emergency Medicine

## 2014-04-19 DIAGNOSIS — Z79899 Other long term (current) drug therapy: Secondary | ICD-10-CM | POA: Diagnosis not present

## 2014-04-19 DIAGNOSIS — Z85828 Personal history of other malignant neoplasm of skin: Secondary | ICD-10-CM | POA: Diagnosis not present

## 2014-04-19 DIAGNOSIS — W1809XA Striking against other object with subsequent fall, initial encounter: Secondary | ICD-10-CM | POA: Diagnosis not present

## 2014-04-19 DIAGNOSIS — T07XXXA Unspecified multiple injuries, initial encounter: Secondary | ICD-10-CM | POA: Diagnosis not present

## 2014-04-19 DIAGNOSIS — R509 Fever, unspecified: Secondary | ICD-10-CM | POA: Insufficient documentation

## 2014-04-19 DIAGNOSIS — G8929 Other chronic pain: Secondary | ICD-10-CM | POA: Diagnosis not present

## 2014-04-19 DIAGNOSIS — Z8739 Personal history of other diseases of the musculoskeletal system and connective tissue: Secondary | ICD-10-CM | POA: Insufficient documentation

## 2014-04-19 DIAGNOSIS — Y929 Unspecified place or not applicable: Secondary | ICD-10-CM | POA: Diagnosis not present

## 2014-04-19 DIAGNOSIS — Y9301 Activity, walking, marching and hiking: Secondary | ICD-10-CM | POA: Diagnosis not present

## 2014-04-19 DIAGNOSIS — R51 Headache: Secondary | ICD-10-CM | POA: Insufficient documentation

## 2014-04-19 DIAGNOSIS — Z880 Allergy status to penicillin: Secondary | ICD-10-CM | POA: Diagnosis not present

## 2014-04-19 DIAGNOSIS — F411 Generalized anxiety disorder: Secondary | ICD-10-CM | POA: Diagnosis not present

## 2014-04-19 DIAGNOSIS — G44329 Chronic post-traumatic headache, not intractable: Secondary | ICD-10-CM | POA: Diagnosis not present

## 2014-04-19 HISTORY — DX: Headache, unspecified: R51.9

## 2014-04-19 HISTORY — DX: Unspecified osteoarthritis, unspecified site: M19.90

## 2014-04-19 HISTORY — DX: Headache: R51

## 2014-04-19 HISTORY — DX: Unspecified malignant neoplasm of skin, unspecified: C44.90

## 2014-04-19 LAB — URINALYSIS, ROUTINE W REFLEX MICROSCOPIC
Bilirubin Urine: NEGATIVE
GLUCOSE, UA: NEGATIVE mg/dL
HGB URINE DIPSTICK: NEGATIVE
Ketones, ur: NEGATIVE mg/dL
Nitrite: NEGATIVE
Protein, ur: NEGATIVE mg/dL
SPECIFIC GRAVITY, URINE: 1.01 (ref 1.005–1.030)
Urobilinogen, UA: 0.2 mg/dL (ref 0.0–1.0)
pH: 5.5 (ref 5.0–8.0)

## 2014-04-19 LAB — CBC WITH DIFFERENTIAL/PLATELET
Basophils Absolute: 0.1 10*3/uL (ref 0.0–0.1)
Basophils Relative: 0 % (ref 0–1)
Eosinophils Absolute: 0.1 10*3/uL (ref 0.0–0.7)
Eosinophils Relative: 1 % (ref 0–5)
HCT: 35.8 % — ABNORMAL LOW (ref 36.0–46.0)
HEMOGLOBIN: 12.2 g/dL (ref 12.0–15.0)
LYMPHS ABS: 1.6 10*3/uL (ref 0.7–4.0)
LYMPHS PCT: 12 % (ref 12–46)
MCH: 29.3 pg (ref 26.0–34.0)
MCHC: 34.1 g/dL (ref 30.0–36.0)
MCV: 86.1 fL (ref 78.0–100.0)
MONOS PCT: 6 % (ref 3–12)
Monocytes Absolute: 0.9 10*3/uL (ref 0.1–1.0)
NEUTROS ABS: 11.4 10*3/uL — AB (ref 1.7–7.7)
NEUTROS PCT: 81 % — AB (ref 43–77)
Platelets: 250 10*3/uL (ref 150–400)
RBC: 4.16 MIL/uL (ref 3.87–5.11)
RDW: 14.2 % (ref 11.5–15.5)
WBC: 14.1 10*3/uL — AB (ref 4.0–10.5)

## 2014-04-19 LAB — RAPID URINE DRUG SCREEN, HOSP PERFORMED
AMPHETAMINES: NOT DETECTED
BENZODIAZEPINES: POSITIVE — AB
Barbiturates: NOT DETECTED
Cocaine: NOT DETECTED
Opiates: NOT DETECTED
Tetrahydrocannabinol: NOT DETECTED

## 2014-04-19 LAB — ETHANOL: ALCOHOL ETHYL (B): 11 mg/dL (ref 0–11)

## 2014-04-19 LAB — BASIC METABOLIC PANEL
Anion gap: 11 (ref 5–15)
BUN: 6 mg/dL (ref 6–23)
CO2: 25 meq/L (ref 19–32)
Calcium: 8.5 mg/dL (ref 8.4–10.5)
Chloride: 102 mEq/L (ref 96–112)
Creatinine, Ser: 0.75 mg/dL (ref 0.50–1.10)
GFR calc Af Amer: >90 mL/min (ref >90)
GFR calc non Af Amer: 89 mL/min — ABNORMAL LOW (ref >90)
GLUCOSE: 107 mg/dL — AB (ref 70–99)
POTASSIUM: 4 meq/L (ref 3.7–5.3)
SODIUM: 138 meq/L (ref 137–147)

## 2014-04-19 LAB — URINE MICROSCOPIC-ADD ON

## 2014-04-19 MED ORDER — SODIUM CHLORIDE 0.9 % IV SOLN
Freq: Once | INTRAVENOUS | Status: AC
  Administered 2014-04-19: 09:00:00 via INTRAVENOUS

## 2014-04-19 NOTE — ED Provider Notes (Signed)
CSN: [id number]     Arrival date & time 04/19/14  2979 History   First MD Initiated Contact with Patient 04/19/14 [phone]     Chief Complaint  Patient presents with  . Headache     (Consider location/radiation/quality/duration/timing/severity/associated sxs/prior Treatment) HPI   Carla Tran is a 63 y.o. female who is a poor historian, with h/o recurrent headaches, presents to the Emergency Department complaining of sudden onset of frontal headache.  She states that she woke up from sleep "sat straight up in the bed with a pounding feeling in the front of my head".  She reports having increased stress recently due to family members and reports two weeks of sleeping walking and falls.  She notes a fall approximately two weeks ago that she struck left head , lateral chest and a second fall several days later that caused left knee pain.  Patient also c/o numbness and tingling sensations to both hands and toes of both feet during onset of the headache.  She also reports intermittent sweats and fever.  She states that she took hydrocodone and xanax and laid back down "for a little while" and headache and numbness resolved.  She denies vomiting, visual changes, neck pain or stiffness.  Pt is followed by her PMD and pain management for chronic back pain   Past Medical History  Diagnosis Date  . DJD (degenerative joint disease)   . Head ache   . Skin cancer    Past Surgical History  Procedure Laterality Date  . Back surgery    . Cholecystectomy    . Abdominal hysterectomy     No family history on file. History  Substance Use Topics  . Smoking status: Never Smoker   . Smokeless tobacco: Not on file  . Alcohol Use: No   OB History   Grav Para Term Preterm Abortions TAB SAB Ect Mult Living            2     Review of Systems  Constitutional: Positive for fever and chills. Negative for activity change and appetite change.  HENT: Negative for facial swelling and trouble swallowing.   Eyes:  Negative for photophobia, pain and visual disturbance.  Respiratory: Negative for shortness of breath.   Cardiovascular: Negative for chest pain.       Left rib pain  Gastrointestinal: Negative for nausea, vomiting and abdominal pain.  Genitourinary: Negative for dysuria, frequency, hematuria and flank pain.  Musculoskeletal: Positive for arthralgias. Negative for joint swelling, neck pain and neck stiffness.       Left knee pain  Skin: Negative for rash and wound.  Neurological: Positive for headaches. Negative for dizziness, seizures, syncope, facial asymmetry, speech difficulty, weakness, light-headedness and numbness.  Psychiatric/Behavioral: Positive for sleep disturbance. Negative for confusion and decreased concentration.  All other systems reviewed and are negative.     Allergies  Codeine; Ivp dye; Oxycodone-acetaminophen; Penicillins; and Sulfonamide derivatives  Home Medications   Prior to Admission medications   Medication Sig Start Date End Date Taking? Authorizing Provider  diphenoxylate-atropine (LOMOTIL) 2.5-0.025 MG per tablet Take 1 tablet by mouth 2 (two) times daily. 02/02/12   Milus Banister, MD   BP 131/75  Pulse 105  Temp(Src) 98.2 F (36.8 C) (Oral)  Resp 16  Ht 5\' 2"  (1.575 m)  Wt 150 lb (68.04 kg)  BMI 27.43 kg/m2  SpO2 95% Physical Exam  Nursing note and vitals reviewed. Constitutional: She is oriented to person, place, and time. She appears well-developed and  well-nourished. No distress.  HENT:  Head: Normocephalic and atraumatic.  Right Ear: Tympanic membrane and ear canal normal. No mastoid tenderness. Tympanic membrane is not erythematous. No hemotympanum.  Left Ear: Tympanic membrane and ear canal normal. No mastoid tenderness. Tympanic membrane is not erythematous. No hemotympanum.  Mouth/Throat: Uvula is midline. Mucous membranes are dry. No oropharyngeal exudate, posterior oropharyngeal edema or posterior oropharyngeal erythema.  Mucous  membranes are very dry  Eyes: Conjunctivae and EOM are normal. Pupils are equal, round, and reactive to light.  Neck: Normal range of motion and full passive range of motion without pain. Neck supple. No Kernig's sign noted.  Cardiovascular: Normal rate, regular rhythm, normal heart sounds and intact distal pulses.   No murmur heard. Pulmonary/Chest: Effort normal and breath sounds normal. No respiratory distress. She has no wheezes. She has no rales.  Localized ttp of the lateral left chest wall.  No bruising, abrasions or crepitus on exam  Abdominal: Soft. She exhibits no distension and no mass. There is no tenderness. There is no rebound and no guarding.  Musculoskeletal: She exhibits tenderness.  Localized ttp of the left anterior knee.  No edema, bruising, effusion or step off deformity.  Pt has full ROM of the knee.  DP pulses brisk, distal sensation intact.  Compartments of the left LE are soft  Lymphadenopathy:    She has no cervical adenopathy.  Neurological: She is alert and oriented to person, place, and time. She has normal strength. No sensory deficit. She exhibits normal muscle tone. Coordination normal. GCS eye subscore is 4. GCS verbal subscore is 5. GCS motor subscore is 6.  Patient has occasional slurring of her words, but speech is appropriate and patient reports is her baseline.  .   Skin: Skin is warm.  patient has erythematous, confluent macular rash under bilateral breasts, no drainage or edema  Psychiatric:  Patient appears anxious    ED Course  Procedures (including critical care time) Labs Review Labs Reviewed  CBC WITH DIFFERENTIAL - Abnormal; Notable for the following:    WBC 14.1 (*)    HCT 35.8 (*)    Neutrophils Relative % 81 (*)    Neutro Abs 11.4 (*)    All other components within normal limits  BASIC METABOLIC PANEL - Abnormal; Notable for the following:    Glucose, Bld 107 (*)    GFR calc non Af Amer 89 (*)    All other components within normal  limits  URINALYSIS, ROUTINE W REFLEX MICROSCOPIC - Abnormal; Notable for the following:    APPearance HAZY (*)    Leukocytes, UA TRACE (*)    All other components within normal limits  URINE RAPID DRUG SCREEN (HOSP PERFORMED) - Abnormal; Notable for the following:    Benzodiazepines POSITIVE (*)    All other components within normal limits  URINE MICROSCOPIC-ADD ON - Abnormal; Notable for the following:    Squamous Epithelial / LPF FEW (*)    Bacteria, UA FEW (*)    All other components within normal limits  URINE CULTURE  ETHANOL    Imaging Review Dg Chest 2 View  04/19/2014   CLINICAL DATA:  Headache, fall  EXAM: CHEST  2 VIEW  COMPARISON:  None.  FINDINGS: The heart size and mediastinal contours are within normal limits. Both lungs are clear with the exception of linear right lower lobe atelectasis or scarring. Cholecystectomy clips are noted. The visualized skeletal structures are unremarkable.  IMPRESSION: No active cardiopulmonary disease.   Electronically Signed  By: Conchita Paris M.D.   On: 04/19/2014 10:22   Ct Head Wo Contrast  04/19/2014   CLINICAL DATA:  Intermittent posterior headache with mumbled speech and blurred vision. No known trauma.  EXAM: CT HEAD WITHOUT CONTRAST  TECHNIQUE: Contiguous axial images were obtained from the base of the skull through the vertex without intravenous contrast.  COMPARISON:  Head CT- 03/03/2005  FINDINGS: Gray-white differentiation is maintained. No CT evidence of acute large territory infarct. No intraparenchymal or extra-axial mass or hemorrhage. Normal size and configuration of the ventricles and basilar cisterns. No midline shift. Limited visualization of the paranasal sinuses and mastoid air cells are normal. No air-fluid levels. Regional soft tissues appear normal. No displaced calvarial fracture.  IMPRESSION: Negative noncontrast head CT.   Electronically Signed   By: Sandi Mariscal M.D.   On: 04/19/2014 10:17     EKG Interpretation None       MDM   Final diagnoses:  Chronic post-traumatic headache, not intractable  Multiple contusions    patient is well appearing, non-toxic and clinical suspicion for acute brain injury is low.  Patient has slight slurring of her words, which she states is chronic and reports that she is currently seeing a speech therapist. Patient is feeling better after IVF's and requesting discharge.  On recheck, she has been ambulated in the dept w/o difficulty, gait is steady.  She has been on the telephone trying to make arrangements to get back home.  I have advised her to f/u with her PMD in few days for recheck or to return here if needed.    Patient has also been evaluated by Dr. Sabra Heck and care plan discussed.  Pt appears stable for d/c     Karma Ansley L. Vanessa Hampden, PA-C 04/20/14 2045

## 2014-04-19 NOTE — Discharge Instructions (Signed)
Contusion A contusion is a deep bruise. Contusions happen when an injury causes bleeding under the skin. Signs of bruising include pain, puffiness (swelling), and discolored skin. The contusion may turn blue, purple, or yellow. HOME CARE   Put ice on the injured area.  Put ice in a plastic bag.  Place a towel between your skin and the bag.  Leave the ice on for 15-20 minutes, 03-04 times a day.  Only take medicine as told by your doctor.  Rest the injured area.  If possible, raise (elevate) the injured area to lessen puffiness. GET HELP RIGHT AWAY IF:   You have more bruising or puffiness.  You have pain that is getting worse.  Your puffiness or pain is not helped by medicine. MAKE SURE YOU:   Understand these instructions.  Will watch your condition.  Will get help right away if you are not doing well or get worse. Document Released: 01/17/2008 Document Revised: 10/23/2011 Document Reviewed: 06/05/2011 [facility] Patient Information 2015 [place], [place]. This information is not intended to replace advice given to you by your health care provider. Make sure you discuss any questions you have with your health care provider.  General Headache Without Cause A general headache is pain or discomfort felt around the head or neck area. The cause may not be found.  HOME CARE   Keep all doctor visits.  Only take medicines as told by your doctor.  Lie down in a dark, quiet room when you have a headache.  Keep a journal to find out if certain things bring on headaches. For example, write down:  What you eat and drink.  How much sleep you get.  Any change to your diet or medicines.  Relax by getting a massage or doing other relaxing activities.  Put ice or heat packs on the head and neck area as told by your doctor.  Lessen stress.  Sit up straight. Do not tighten (tense) your muscles.  Quit smoking if you smoke.  Lessen how much alcohol you drink.  Lessen how much  caffeine you drink, or stop drinking caffeine.  Eat and sleep on a regular schedule.  Get 7 to 9 hours of sleep, or as told by your doctor.  Keep lights dim if bright lights bother you or make your headaches worse. GET HELP RIGHT AWAY IF:   Your headache becomes really bad.  You have a fever.  You have a stiff neck.  You have trouble seeing.  Your muscles are weak, or you lose muscle control.  You lose your balance or have trouble walking.  You feel like you will pass out (faint), or you pass out.  You have really bad symptoms that are different than your first symptoms.  You have problems with the medicines given to you by your doctor.  Your medicines do not work.  Your headache feels different than the other headaches.  You feel sick to your stomach (nauseous) or throw up (vomit). MAKE SURE YOU:   Understand these instructions.  Will watch your condition.  Will get help right away if you are not doing well or get worse. Document Released: 05/09/2008 Document Revised: 10/23/2011 Document Reviewed: 07/21/2011 [facility] Patient Information 2015 [place], [place]. This information is not intended to replace advice given to you by your health care provider. Make sure you discuss any questions you have with your health care provider.

## 2014-04-19 NOTE — ED Notes (Signed)
AC provided and signed taxi voucher for transport home.

## 2014-04-19 NOTE — ED Notes (Signed)
PA at bedside.

## 2014-04-19 NOTE — ED Provider Notes (Signed)
63 year old female with a history of frequent headaches, a couple of falls over the last month that were associated with possible sleepwalking. Awoke from sleep with throbbing frontal headache and associated tingling in her arms and legs. She took her home anxiety and pain medication and her symptoms have resolved. She presented for evaluation of her headache. On exam she has normal neurologic exam with coordinated movements of all 4 extremities, cranial nerves III through XII intact, normal sensation to the arms and legs, speech is close to baseline though she states that she has a slight slurring of her words. Listening to her speaking she only slurs occasional words at the end of sentences, she is able to answer my questions appropriately and is alert and oriented x3. No signs of acute head injury, lungs and heart are normal, no peripheral edema.  Medical screening examination/treatment/procedure(s) were conducted as a shared visit with non-physician practitioner(s) and myself.  I personally evaluated the patient during the encounter.  Clinical Impression:   Final diagnoses:  Chronic post-traumatic headache, not intractable  Multiple contusions         Johnna Acosta, MD 04/22/14 2342

## 2014-04-19 NOTE — ED Notes (Signed)
PT c/o intermittent headaches to the back of her head for the past two weeks and c/o cold chills/ double vision. PT has mumbled speech but A & O x4.

## 2014-04-20 LAB — URINE CULTURE
Colony Count: NO GROWTH
Culture: NO GROWTH

## 2014-04-22 NOTE — ED Provider Notes (Signed)
Medical screening examination/treatment/procedure(s) were conducted as a shared visit with non-physician practitioner(s) and myself.  I personally evaluated the patient during the encounter  Please see my separate respective documentation pertaining to this patient encounter   Johnna Acosta, MD 04/22/14 2342

## 2014-05-17 DIAGNOSIS — G8929 Other chronic pain: Secondary | ICD-10-CM | POA: Insufficient documentation

## 2014-05-17 DIAGNOSIS — F339 Major depressive disorder, recurrent, unspecified: Secondary | ICD-10-CM | POA: Insufficient documentation

## 2014-05-17 DIAGNOSIS — M5116 Intervertebral disc disorders with radiculopathy, lumbar region: Secondary | ICD-10-CM | POA: Insufficient documentation

## 2014-05-17 DIAGNOSIS — M5030 Other cervical disc degeneration, unspecified cervical region: Secondary | ICD-10-CM | POA: Insufficient documentation

## 2014-05-17 DIAGNOSIS — F4310 Post-traumatic stress disorder, unspecified: Secondary | ICD-10-CM | POA: Insufficient documentation

## 2014-12-16 ENCOUNTER — Institutional Professional Consult (permissible substitution): Payer: Medicaid Other | Admitting: Pulmonary Disease

## 2014-12-23 ENCOUNTER — Encounter (INDEPENDENT_AMBULATORY_CARE_PROVIDER_SITE_OTHER): Payer: Self-pay

## 2014-12-23 ENCOUNTER — Encounter: Payer: Self-pay | Admitting: Pulmonary Disease

## 2014-12-23 ENCOUNTER — Ambulatory Visit (INDEPENDENT_AMBULATORY_CARE_PROVIDER_SITE_OTHER): Payer: Medicare Other | Admitting: Pulmonary Disease

## 2014-12-23 VITALS — BP 134/80 | HR 80 | Temp 98.1°F | Ht 62.0 in | Wt 164.8 lb

## 2014-12-23 DIAGNOSIS — F513 Sleepwalking [somnambulism]: Secondary | ICD-10-CM | POA: Diagnosis not present

## 2014-12-23 NOTE — Patient Instructions (Signed)
Will schedule for a sleep study to make sure you do not have sleep apnea or other sleep disorder that may be contributing to your behavior at night Will arrange followup once the results are available.

## 2014-12-23 NOTE — Progress Notes (Signed)
Subjective:    Patient ID: Carla Tran, female    DOB: 03-25-1951, 64 y.o.   MRN: [id number]  HPI The patient is a 64 -year-old female who I've been asked to see for sleep walking. The patient states she has had this since childhood, and it is only occurred intermittently since that time. However, over the last few months that his occurred every night, and she is concerned about hurting herself. She has had some falls, but has not broken anything or put herself into danger. She does have a history of PTSD and a major depressive disorder, and does take Xanax each night at bedtime. She has been noted to have loud snoring, as well as an abnormal breathing pattern during sleep. She has frequent awakenings at night, and is not rested in the mornings upon arising. She denies any significant sleepiness during the day, and her Epworth score is only 7. She admits that she has been under a lot of stress lately, her weight is up 20 pounds over the last 2 years. She also mentions that she has a history of seizures, but knows very little about it.  Sleep Questionnaire What time do you typically go to bed?( Between what hours) 8-9p 8-9p at 1547 on 12/23/14 by Inge Rise, [place] How long does it take you to fall asleep? several hours several hours at 1547 on 12/23/14 by Inge Rise, CMA How many times during the night do you wake up? 1 1 at 1547 on 12/23/14 by Inge Rise, CMA What time do you get out of bed to start your day? 0430 0430 at 1547 on 12/23/14 by Inge Rise, CMA Do you drive or operate heavy machinery in your occupation? No No at 1547 on 12/23/14 by Inge Rise, CMA How much has your weight changed (up or down) over the past two years? (In pounds) 20 lb (9.072 kg) 20 lb (9.072 kg) at 1547 on 12/23/14 by Inge Rise, CMA Have you ever had a sleep study before? No No at 1547 on 12/23/14 by Inge Rise, CMA Do you currently use CPAP? No No at 1547 on 12/23/14 by Inge Rise, CMA Do you wear oxygen at any time? No No at 1547 on 12/23/14 by Inge Rise, CMA   Review of Systems  Constitutional: Negative for fever and unexpected weight change.  HENT: Negative for congestion, dental problem, ear pain, nosebleeds, postnasal drip, rhinorrhea, sinus pressure, sneezing, sore throat and trouble swallowing.   Eyes: Negative for redness and itching.  Respiratory: Negative for cough, chest tightness, shortness of breath and wheezing.   Cardiovascular: Negative for palpitations and leg swelling.  Gastrointestinal: Negative for nausea and vomiting.  Genitourinary: Negative for dysuria.  Musculoskeletal: Negative for joint swelling.  Skin: Negative for rash.  Neurological: Negative for headaches.  Hematological: Does not bruise/bleed easily.  Psychiatric/Behavioral: Negative for dysphoric mood. The patient is not nervous/anxious.        Objective:   Physical Exam Constitutional:  Overweight female, no acute distress  HENT:  Nares patent without discharge  Oropharynx without exudate, palate and uvula are mildly elongated  Eyes:  Perrla, eomi, no scleral icterus  Neck:  No JVD, no TMG  Cardiovascular:  Normal rate, regular rhythm, no rubs or gallops.  2/6 sem        Intact distal pulses  Pulmonary :  Normal breath sounds, no stridor or respiratory distress   No rales, rhonchi, or wheezing  Abdominal:  Soft, nondistended, bowel sounds present.  No tenderness noted.   Musculoskeletal:  No lower extremity edema noted.  Lymph Nodes:  No cervical lymphadenopathy noted  Skin:  No cyanosis noted  Neurologic:  Alert, appropriate, moves all 4 extremities without obvious deficit.         Assessment & Plan:

## 2014-12-23 NOTE — Assessment & Plan Note (Addendum)
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  Most recently she has had issues every night for the last few months, but at least has not heard herself. It is unclear if this sudden increase in symptoms is related to underlying stress or psychiatric disease, and I also think we need to consider other sleep disorder such as sleep apnea or possibly nocturnal seizures given her history. I have recommended that she have a sleep study, and if nothing specific is found, I would probably treat her with clonazepam at bedtime to see if we can limit her episodes at night.

## 2015-02-03 ENCOUNTER — Other Ambulatory Visit: Payer: Self-pay | Admitting: Pulmonary Disease

## 2015-02-03 ENCOUNTER — Telehealth: Payer: Self-pay | Admitting: Pulmonary Disease

## 2015-02-03 DIAGNOSIS — F513 Sleepwalking [somnambulism]: Secondary | ICD-10-CM

## 2015-02-03 NOTE — Telephone Encounter (Signed)
FYI for Dr Lupita Shutter patient Sleep study scheduled for 7.19.16  Per last ov w/ Red Willow on 5.11.16: Patient Instructions       Will schedule for a sleep study to make sure you do not have sleep apnea or other sleep disorder that may be contributing to your behavior at night Will arrange followup once the results are available.     Will sign and forward to RA as FYI

## 2015-03-02 ENCOUNTER — Ambulatory Visit (HOSPITAL_BASED_OUTPATIENT_CLINIC_OR_DEPARTMENT_OTHER): Payer: Medicare Other | Attending: Pulmonary Disease

## 2015-03-02 ENCOUNTER — Encounter (HOSPITAL_BASED_OUTPATIENT_CLINIC_OR_DEPARTMENT_OTHER): Payer: Medicare Other

## 2015-03-02 DIAGNOSIS — F513 Sleepwalking [somnambulism]: Secondary | ICD-10-CM | POA: Diagnosis not present

## 2015-03-02 DIAGNOSIS — R0683 Snoring: Secondary | ICD-10-CM | POA: Diagnosis not present

## 2015-03-02 DIAGNOSIS — R5383 Other fatigue: Secondary | ICD-10-CM | POA: Insufficient documentation

## 2015-03-02 DIAGNOSIS — F329 Major depressive disorder, single episode, unspecified: Secondary | ICD-10-CM | POA: Insufficient documentation

## 2015-03-03 ENCOUNTER — Telehealth: Payer: Self-pay | Admitting: Pulmonary Disease

## 2015-03-03 DIAGNOSIS — F513 Sleepwalking [somnambulism]: Secondary | ICD-10-CM | POA: Diagnosis not present

## 2015-03-03 NOTE — Addendum Note (Signed)
Addended by: Rigoberto Noel on: 03/03/2015 01:21 PM   Modules accepted: Level of Service

## 2015-03-03 NOTE — Progress Notes (Signed)
Patient Name: Carla Tran, Carla Tran Date: 03/02/2015 Gender: Female D.O.B: [birth year] Age (years): 3 Referring Provider: Clance Height (inches): 62 Interpreting Physician: Kara Mead MD, ABSM Weight (lbs): 164 RPSGT: Madelon Lips BMI: 30 MRN: [id number] Neck Size: 16.00   CLINICAL INFORMATION Sleep Study Type: NPSG    Indication for sleep study: Daytime Fatigue, snoring,  Depression, h/o sleep walking with increased frequency  Epworth Sleepiness Score: 7     SLEEP STUDY TECHNIQUE As per the AASM Manual for the Scoring of Sleep and Associated Events v2.3 (April 2016) with a hypopnea requiring 4% desaturations.  The channels recorded and monitored were frontal, central and occipital EEG, electrooculogram (EOG), submentalis EMG (chin), nasal and oral airflow, thoracic and abdominal wall motion, anterior tibialis EMG, snore microphone, electrocardiogram, and pulse oximetry.  MEDICATIONS Medications self-administered by patient during sleep study : No sleep medicine administered.  SLEEP ARCHITECTURE The study was initiated at 10:59:39 PM and ended at 5:12:07 AM.  Sleep onset time was 35.7 minutes and the sleep efficiency was 89.5%. The total sleep time was 333.5 minutes.  Stage REM latency was N/A minutes.  The patient spent 3.60% of the night in stage N1 sleep, 94.45% in stage N2 sleep, 1.95% in stage N3 and 0.00% in REM.  Alpha intrusion was absent.  Supine sleep was 27.59%.  RESPIRATORY PARAMETERS The overall apnea/hypopnea index (AHI) was 1.4 per hour. There were 0 total apneas, including 0 obstructive, 0 central and 0 mixed apneas. There were 8 hypopneas and 0 RERAs.  AHI while supine was 2.6 per hour.  The mean oxygen saturation was 94.05%. The minimum SpO2 during sleep was 89.00%.  Loud snoring was noted during this study.  CARDIAC DATA The 2 lead EKG demonstrated sinus rhythm. The mean heart rate was 74.76 beats per minute. Other EKG findings include:  None.  LEG MOVEMENT DATA The total PLMS were 120 with a resulting PLMS index of 21.59. Associated arousal with leg movement index was 0.0 .  IMPRESSIONS No significant obstructive sleep apnea occurred during this study (AHI = 1.4/h). No significant central sleep apnea occurred during this study (CAI = 0.0/h). The patient had minimal or no oxygen desaturation during the study (Min O2 = 89.00%) The patient snored with Loud snoring volume. No cardiac abnormalities were noted during this study. Mild periodic limb movements of sleep occurred during the study. No significant associated arousals.  DIAGNOSIS Limb movements without significant arousals. The significance is unclear Parasomnia- H/o sleep walking - but not noted during this study  RECOMMENDATIONS Avoid alcohol, sedatives and other CNS depressants that may worsen sleep apnea and disrupt normal sleep architecture. Sleep hygiene should be reviewed to assess factors that may improve sleep quality. Weight management and regular exercise should be initiated or continued if appropriate.  Kara Mead MD. Shade Flood. [facility]

## 2015-03-03 NOTE — Telephone Encounter (Signed)
No evidence of OSA or other cause of sleep walking noted during study Few limb movements but these did not cause arousals Recommend-  Observation for now

## 2015-03-04 NOTE — Telephone Encounter (Signed)
Patient says that she doesn't walk in her sleep every night, she says she has been doing this since she was 64 years old.  Patient says that a few weeks ago, she woke up with a huge black eye and blood on her face One other time, she woke up with a fractured ankle Patient says that she woke up one morning with a muddy spot on her pajamas and hole in her pajamas She is concerned about this because she says that another provider (couldn't remember name) told her that it is possible that she could drive in her sleep  Wants to know if there are any other tests that can be performed to try to catch her walking in her sleep?  RA - please advise.

## 2015-03-08 NOTE — Telephone Encounter (Signed)
No other testing necessary  Protective measures Use of padding on nearby furniture and on the floor beside the bed. Lowering the mattress to the floor, or using a bedroom on the ground floor in a multilevel home. Securing doors and windows with locks, a bedroom door alarm, barriers at the top of the stairs. Keep car keys away or in a  drawer  Pl schedule OV to discuss meds

## 2015-03-11 NOTE — Telephone Encounter (Signed)
Patient notified. Patient does not want to schedule appointment at this time. Nothing further needed.

## 2015-06-23 ENCOUNTER — Encounter: Payer: Self-pay | Admitting: Adult Health

## 2015-06-23 ENCOUNTER — Ambulatory Visit (INDEPENDENT_AMBULATORY_CARE_PROVIDER_SITE_OTHER): Payer: Medicare Other | Admitting: Adult Health

## 2015-06-23 VITALS — BP 122/88 | HR 82 | Temp 98.2°F | Ht 62.0 in | Wt 162.0 lb

## 2015-06-23 DIAGNOSIS — F513 Sleepwalking [somnambulism]: Secondary | ICD-10-CM
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
## 2015-06-23 NOTE — Assessment & Plan Note (Addendum)
Suspect her sleep walking and driving episodes are secondary to oversedation/narcotics along w/ benzos  Combination of oxycodone, xanax and "sleeping pill " are extemely dangerous combination . She also is on neurontin , benadryl and cymbalta which can have sedating effects may be contributing factor.  Is good that she is off oxycodone and sleeping pill . Asked her to avoid benadryl as well.  Would be good to further taper slowly off xanax if possible.  Sleep study did not show OSA or significant PLMS .  Will refer to neurology per her request for further evaluation.     Plan  Refer to Neurology .  Your sleep study does not show sleep apnea.  Avoid sedating medications if possible  Avoid sleeping pills.  No alcohol .  Avoid benadryl .  Do not drive if sleepy.  Follow up with our sleep clinic As needed

## 2015-06-23 NOTE — Patient Instructions (Addendum)
Refer to Neurology .  Your sleep study does not show sleep apnea.  Avoid sedating medications if possible  Avoid sleeping pills.  No alcohol .  Avoid benadryl .  Do not drive if sleepy.  Follow up with our sleep clinic As needed

## 2015-06-24 NOTE — Progress Notes (Signed)
Reviewed and agree with assessment/plan. 

## 2015-08-11 ENCOUNTER — Ambulatory Visit: Payer: Medicare Other | Admitting: Neurology

## 2017-02-22 ENCOUNTER — Telehealth: Payer: Self-pay | Admitting: Gastroenterology

## 2017-02-22 NOTE — Telephone Encounter (Signed)
The pt has not been seen in this office since 2012.  She was advised to call her PCP and I offered to set her up an appt with Dr Ardis Hughs first available but pt declined.

## 2017-05-01 DIAGNOSIS — F419 Anxiety disorder, unspecified: Secondary | ICD-10-CM | POA: Insufficient documentation

## 2018-03-28 ENCOUNTER — Encounter: Payer: Self-pay | Admitting: Nurse Practitioner

## 2018-04-18 ENCOUNTER — Ambulatory Visit (INDEPENDENT_AMBULATORY_CARE_PROVIDER_SITE_OTHER): Payer: Medicare Other | Admitting: Nurse Practitioner

## 2018-04-18 ENCOUNTER — Encounter: Payer: Self-pay | Admitting: Nurse Practitioner

## 2018-04-18 VITALS — BP 110/74 | HR 84 | Ht 62.0 in | Wt 165.6 lb

## 2018-04-18 DIAGNOSIS — K5900 Constipation, unspecified: Secondary | ICD-10-CM | POA: Diagnosis not present

## 2018-04-18 MED ORDER — LINACLOTIDE 145 MCG PO CAPS: 145.0000 ug | ORAL_CAPSULE | Freq: Every day | ORAL | 1 refills | Status: DC

## 2018-04-18 NOTE — Progress Notes (Signed)
GI Provider:   Oretha Caprice, MD        Chief Complaint: bowel problems  Referring Provider:     Self referred   ASSESSMENT AND PLAN;   67 yo female with altered bowel habits. She describes alternating constipation / diarrhea. Diarrhea occurs after she is able to pass or digitally remove hard stool from rectum. I think underlying problem is constipation and diarrhea is overflow.  -first need to treat constipation. Last BM 5 days ago. Will purge bowels with Mg+ Citrate then start Linzess (if insurance will pay for it).  -I strongly advised her not to take anti-diarrheals right now. I am hopeful that treatment of constipation will stop the diarrhea. If not then will evaluate further.  -she will follow up with me in a couple of weeks. Call in interim if worsening.     HPI:     Patient is a 67 year old female with long history of altered bowel habits. She is to see Urology soon for a cystocele. In 2012, for evaluation of diarrhea,  she underwent colonoscopy with TI intubation and random colon biopsies. Exam and biopsies normal. She used to get constipated with pain meds but not longer takes pain pills but says bowel movement are all over the place. She takes metamucil everyday and uses enemas as needed. Often has to digitally disimpact herself. After removal of hard stool she gets diarrhea and starts taking imodium, sometimes as many as 8 / day. Currently she hasn't had a BM in 5 days.  No blood in stool. No abdominal pain, nausea / vomiting or other GI complaints   Past Medical History:  Diagnosis Date  . Bursitis    shoulder  . Cervical cancer ([place])   . DJD (degenerative joint disease)   . Endometriosis   . Head ache   . PTSD (post-traumatic stress disorder)   . Skin cancer   . Stroke [facility])      Past Surgical History:  Procedure Laterality Date  . ABDOMINAL HYSTERECTOMY    . BACK SURGERY    . CHOLECYSTECTOMY    . ligaments removed from elbow    . skin cancer removed     from face  . TUBAL LIGATION     No family history on file. Social History   Tobacco Use  . Smoking status: Never Smoker  Substance Use Topics  . Alcohol use: No    Alcohol/week: 0.0 standard drinks  . Drug use: No   Current Outpatient Medications  Medication Sig Dispense Refill  . ALPRAZolam (XANAX) 1 MG tablet Take 1 mg by mouth 4 (four) times daily as needed.    Marland Kitchen amitriptyline (ELAVIL) 25 MG tablet Take 25 mg by mouth daily.    Marland Kitchen BUTRANS 5 MCG/HR PTWK patch Use as directed    . clotrimazole-betamethasone (LOTRISONE) cream Apply 1 application topically daily as needed (rash).     . DULoxetine (CYMBALTA) 60 MG capsule Take 60 mg by mouth daily.     Marland Kitchen gabapentin (NEURONTIN) 300 MG capsule Take 300 mg by mouth 2 (two) times daily.    Marland Kitchen latanoprost (XALATAN) 0.005 % ophthalmic solution Place 1 drop into both eyes at bedtime.    . Multiple Vitamins-Minerals (MULTIVITAMIN WITH MINERALS) tablet Take 1 tablet by mouth daily.    . Naproxen Sodium 220 MG CAPS Take 220 mg by mouth 2 (two) times daily as needed.    . Omega-3 Fatty Acids (FISH OIL) 1000 MG CAPS Take 1 g  by mouth daily.    Marland Kitchen oxyCODONE (ROXICODONE) 15 MG immediate release tablet Take 15 mg by mouth 4 (four) times daily as needed.    Marland Kitchen PATADAY 0.2 % SOLN Place 1 drop into both eyes daily.    . SF 5000 PLUS 1.1 % CREA dental cream Take 1 application by mouth 2 (two) times daily.     No current facility-administered medications for this visit.    Allergies  Allergen Reactions  . Penicillins Anaphylaxis  . Darvon [Propoxyphene] Other (See Comments)  . Sulfonamide Derivatives Other (See Comments)    Stomach Spasms   . Codeine Itching and Rash  . Ivp Dye [Iodinated Diagnostic Agents] Rash   Review of Systems: Positive for anxiety, arthritis, back pain, depression, excessive urination, sleeping problems, voice changes. All other systems reviewed and negative except where noted in HPI.   Creatinine clearance cannot be  calculated (Patient's most recent lab result is older than the maximum 21 days allowed.)   Physical Exam:    Wt Readings from Last 3 Encounters:  06/23/15 162 lb (73.5 kg)  03/02/15 164 lb (74.4 kg)  12/23/14 164 lb 12.8 oz (74.8 kg)    BP 110/74   Pulse 84   Ht 5\' 2"  (1.575 m)   Wt 165 lb 9.6 oz (75.1 kg)   BMI 30.29 kg/m  Constitutional:  Pleasant female in no acute distress. Psychiatric: Normal mood and affect. Behavior is normal. EENT: Pupils normal.  Conjunctivae are normal. No scleral icterus. Neck supple.  Cardiovascular: Normal rate, regular rhythm. No edema Pulmonary/chest: Effort normal and breath sounds normal. No wheezing, rales or rhonchi. Abdominal: Soft, nondistended, nontender. Bowel sounds active throughout. There are no masses palpable. No hepatomegaly. Neurological: Alert and oriented to person place and time. Skin: Skin is warm and dry. No rashes noted.  Tye Savoy, NP  04/18/2018, 10:14 AM

## 2018-04-18 NOTE — Patient Instructions (Addendum)
If you are age 66 or older, your body mass index should be between 23-30. Your Body mass index is 30.29 kg/m. If this is out of the aforementioned range listed, please consider follow up with your Primary Care Provider.  If you are age 97 or younger, your body mass index should be between 19-25. Your Body mass index is 30.29 kg/m. If this is out of the aformentioned range listed, please consider follow up with your Primary Care Provider.   STOP ANTI-DIARRHEALS - NO IMODIUM, NO LOMOTIL.  Take Magnesium Citrate - bottle tomorrow.  Then take Linzess 145 mcg every morning before breakfast.  Follow up with me on May 01, 2018 at 10:30 am.  Thank you for choosing me and [facility].   Tye Savoy, NP

## 2018-04-19 ENCOUNTER — Encounter: Payer: Self-pay | Admitting: Nurse Practitioner

## 2018-04-19 NOTE — Progress Notes (Signed)
I agree with the above note, plan 

## 2018-05-01 ENCOUNTER — Ambulatory Visit: Payer: Medicare Other | Admitting: Nurse Practitioner

## 2018-05-21 ENCOUNTER — Telehealth: Payer: Self-pay | Admitting: Nurse Practitioner

## 2018-05-21 NOTE — Telephone Encounter (Signed)
Again cannot get the phone number to ring.

## 2018-05-21 NOTE — Telephone Encounter (Signed)
Patient's phone number does not ring. Busy signal. Tried calling multiple times and from different extensions. Busy signal.

## 2018-05-23 NOTE — Telephone Encounter (Signed)
Call still will not go through.

## 2018-05-27 NOTE — Telephone Encounter (Signed)
Patient did not get the Magnesium Citrate from her pharmacy. Her medications are delivered if they are prescription only. She did not realize it was not prescription. Spelled it out for her. She thanked me for my call.

## 2018-06-21 ENCOUNTER — Other Ambulatory Visit: Payer: Self-pay | Admitting: Nurse Practitioner

## 2018-07-20 ENCOUNTER — Other Ambulatory Visit: Payer: Self-pay | Admitting: Nurse Practitioner

## 2018-10-07 DIAGNOSIS — M533 Sacrococcygeal disorders, not elsewhere classified: Secondary | ICD-10-CM | POA: Insufficient documentation

## 2019-01-07 ENCOUNTER — Other Ambulatory Visit: Payer: Self-pay

## 2019-01-07 ENCOUNTER — Ambulatory Visit (INDEPENDENT_AMBULATORY_CARE_PROVIDER_SITE_OTHER): Payer: Medicare Other | Admitting: Psychiatry

## 2019-01-07 DIAGNOSIS — F324 Major depressive disorder, single episode, in partial remission: Secondary | ICD-10-CM

## 2019-01-07 MED ORDER — CITALOPRAM HYDROBROMIDE 20 MG PO TABS: 20.0000 mg | ORAL_TABLET | Freq: Every day | ORAL | 2 refills | Status: DC

## 2019-01-07 MED ORDER — DULOXETINE HCL 30 MG PO CPEP: ORAL_CAPSULE | ORAL | 2 refills | Status: DC
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
## 2019-01-29 ENCOUNTER — Ambulatory Visit (HOSPITAL_COMMUNITY): Payer: Medicare Other | Admitting: Licensed Clinical Social Worker

## 2019-01-29 DIAGNOSIS — F324 Major depressive disorder, single episode, in partial remission: Secondary | ICD-10-CM

## 2019-01-29 DIAGNOSIS — F4312 Post-traumatic stress disorder, chronic: Secondary | ICD-10-CM
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
## 2019-02-03 ENCOUNTER — Ambulatory Visit (INDEPENDENT_AMBULATORY_CARE_PROVIDER_SITE_OTHER): Payer: Medicare Other | Admitting: Licensed Clinical Social Worker

## 2019-02-03 DIAGNOSIS — F4312 Post-traumatic stress disorder, chronic: Secondary | ICD-10-CM | POA: Diagnosis not present

## 2019-02-03 DIAGNOSIS — F324 Major depressive disorder, single episode, in partial remission: Secondary | ICD-10-CM
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
## 2019-02-17 ENCOUNTER — Ambulatory Visit (INDEPENDENT_AMBULATORY_CARE_PROVIDER_SITE_OTHER): Payer: Medicare Other | Admitting: Licensed Clinical Social Worker

## 2019-02-17 DIAGNOSIS — F4312 Post-traumatic stress disorder, chronic: Secondary | ICD-10-CM

## 2019-02-17 NOTE — Progress Notes (Signed)
Virtual Visit via Telephone Note  I connected with Carla Tran on 02/17/19 at 11:00 AM EDT by telephone and verified that I am speaking with the correct person using two identifiers.   I discussed the limitations, risks, security and privacy concerns of performing an evaluation and management service by telephone and the availability of in person appointments. I also discussed with the patient that there may be a patient responsible charge related to this service. The patient expressed understanding and agreed to proceed.  I discussed the assessment and treatment plan with the patient. The patient was provided an opportunity to ask questions and all were answered. The patient agreed with the plan and demonstrated an understanding of the instructions.   The patient was advised to call back or seek an in-person evaluation if the symptoms worsen or if the condition fails to improve as anticipated.  Type of Therapy: Individual Therapy  Treatment Goals addressed:  Develop and implement effective coping skills to carry out normal responsibilities and participate constructively in relationships.   Interventions: Reality Therapy and Supportive Counseling  Summary: Carla Tran is a 68 y.o. female who presents with symptoms of irritability, difficulty concentrating, low energy, anxiety/worry thoughts.   Therapist Response:  Patient met with clinician for an individual session. Patient reports high level of stress related to neighbor asking her to do things for her and adult children being in conflict. She states that her panic attacks are happening more often due to the levels of stress, and that she needs to ask the doctor for more medication to help with that. Counselor challenged the patient to explore ways that she can reduce the stress level by making different choices. Patient and counselor explored ways that patient can choose to take better care of herself by setting limits with others. Patient  discussed the guilt she feels when she sets limits with others. Counselor facilitated functional analysis of situation with neighbor and pointed out places where patient could make a different choice. Counselor guided patient in reality testing the guilt she reports she would feel about alternative choices. Patient talked about the way her choices might make others feel. Counselor emphasized that she is not responsible for others' feelings and that when she takes on responsibility for how others' feel this adds to her stress.   Suicidal/Homicidal:  No   Recommendation and plan:  Continued weekly counseling sessions. Next session will be   Diagnosis:  Chronic Post Traumatic Stress Disorder  I provided 56 minutes of non-face-to-face time during this encounter.  Lillie Fragmin, LCSW

## 2019-02-28 ENCOUNTER — Ambulatory Visit (HOSPITAL_COMMUNITY): Payer: Medicare Other | Admitting: Psychiatry

## 2019-02-28 ENCOUNTER — Other Ambulatory Visit: Payer: Self-pay

## 2019-03-05 ENCOUNTER — Ambulatory Visit (INDEPENDENT_AMBULATORY_CARE_PROVIDER_SITE_OTHER): Payer: Medicare Other | Admitting: Licensed Clinical Social Worker

## 2019-03-05 DIAGNOSIS — F324 Major depressive disorder, single episode, in partial remission: Secondary | ICD-10-CM

## 2019-03-05 DIAGNOSIS — F4312 Post-traumatic stress disorder, chronic: Secondary | ICD-10-CM | POA: Diagnosis not present

## 2019-03-05 NOTE — Progress Notes (Signed)
Patient ID: Carla Tran, female   DOB: 1950-11-01, 68 y.o.   MRN: [id number]   Virtual Visit via Telephone Note  I connected with Carla Tran on 03/05/19 at 11:00 AM EDT by telephone and verified that I am speaking with the correct person using two identifiers.  I discussed the limitations, risks, security and privacy concerns of performing an evaluation and management service by telephone and the availability of in person appointments. I also discussed with the patient that there may be a patient responsible charge related to this service. The patient expressed understanding and agreed to proceed.  I discussed the assessment and treatment plan with the patient. The patient was provided an opportunity to ask questions and all were answered. The patient agreed with the plan and demonstrated an understanding of the instructions.   The patient was advised to call back or seek an in-person evaluation if the symptoms worsen or if the condition fails to improve as anticipated.  Type of Therapy: Individual Therapy  Treatment Goals addressed:  Develop and implement effective coping skills to carry out normal responsibilities and participate constructively in relationships.    Interventions: Reality Therapy,  Supportive Counseling  Summary: Carla Tran is a 68 y.o. female who presents with symptoms of depressed mood, worry thoughts, excessive guilt, irritability, and low self-concept.  Therapist Response:  Patient met with clinician for an individual session. Patient reports that she has been saying "no" to her daughter about babysitting, which has been stressful for her. She indicates that she will keep her granddaughter for daughter to work, but not for her to go off with friends or just to have fun. Counselor processed with patient her thoughts and feelings about setting these limits. Patient states that daughter is no longer speaking to her because of this, but patient feels good about standing up  for what is best for her. Counselor and patient explored ways to stick to boundaries that she sets. Counselor likened these new limits with daughter the boundaries patient has set with her sisters in the past. Patient pointed out that her daughter thinks she is wrong for setting those limits with her sisters. Counselor reframed boundaries as self-care and preservation. Counselor pointed out that they are also a way to teach others how to treat you, and sometimes other people do not understand that. Patient and counselor explored the way patient has made intentional choices about her relationships with various family members.    Suicidal/Homicidal:  No  Recommendation and plan:  Continued sessions every other week  Diagnosis:  Major Depressive Disorder, Recurrent, Moderate PTSD  I provided 54 minutes of non-face-to-face time during this encounter.   Lillie Fragmin, LCSW

## 2019-03-19 ENCOUNTER — Ambulatory Visit (INDEPENDENT_AMBULATORY_CARE_PROVIDER_SITE_OTHER): Payer: Medicare Other | Admitting: Psychiatry

## 2019-03-19 ENCOUNTER — Other Ambulatory Visit: Payer: Self-pay

## 2019-03-19 DIAGNOSIS — F331 Major depressive disorder, recurrent, moderate: Secondary | ICD-10-CM | POA: Diagnosis not present

## 2019-03-19 MED ORDER — DULOXETINE HCL 60 MG PO CPEP: ORAL_CAPSULE | ORAL | 3 refills | Status: DC

## 2019-03-19 NOTE — Progress Notes (Signed)
Psychiatric Initial Adult Assessment   Patient Identification: Carla Tran MRN:  [id number] Date of Evaluation:  03/19/2019 Referral Source: [name] Chief Complaint:   Visit Diagnosis: Major depression chronic  History of Present Illness:  At this time the patient is doing only fairly well.  She feels very lonely.  Importantly she has been having weekends.  During the week she is up and down and it depends upon what is going on with her daughter and her family.  I do not think she is necessarily persistently depressed I think she has a lot of circumstances that is upsetting her.  She was seeing a previous psychotherapist since I saw her who unfortunately has had and her involvement.  The patient says she thought she was helpful and like talking to her.  The patient is going to start on a new therapist this week.  Overall patient is sleeping fairly well.  She sleeps the same as she is always slept for the last year or 2.  She sleeps for 5 hours gets up watches TV goes back to sleep for a few more hours.  She does not take naps and she does not feel sleepy.  She says she is gained a lot of weight implicating that she is got a very good appetite.  Her energy and concentration seem to be normal.  The patient is not suicidal.  She drinks no alcohol.  She is having no psychosis.  Patient does describe significant pain that causes a great deal of problems.  The patient is worried that she is on too many medications.  She followed our advice and discontinued her Seroquel. Associated Signs/Symptoms: Depression Symptoms:  disturbed sleep, (Hypo) Manic Symptoms:   Anxiety Symptoms:   Psychotic Symptoms:   PTSD Symptoms:   Past Psychiatric History: Under the care of Dr. Ronnald Ramp and hospitalized for alcohol treatment  Previous Psychotropic Medications: Xanax 0.5 mg 4 times daily, Celexa 40 mg, Seroquel unclear dose  Substance Abuse History in the last 12 months:  Yes.    Consequences of  Substance Abuse:   Past Medical History:  Past Medical History:  Diagnosis Date  . Anxiety   . Bursitis    shoulder  . Cervical cancer ([place])   . Depression   . DJD (degenerative joint disease)   . Endometriosis   . Fibromyalgia   . Head ache   . PTSD (post-traumatic stress disorder)   . Skin cancer   . Stroke [facility])     Past Surgical History:  Procedure Laterality Date  . BACK SURGERY    . CHOLECYSTECTOMY    . ligaments removed from elbow    . PARTIAL HYSTERECTOMY    . skin cancer removed     from face  . TUBAL LIGATION      Family Psychiatric History:   Family History:  Family History  Problem Relation Age of Onset  . Diabetes Mother   . Prostate cancer Father   . Breast cancer Maternal Aunt   . Colon cancer Neg Hx   . Esophageal cancer Neg Hx     Social History:   Social History   Socioeconomic History  . Marital status: Divorced    Spouse name: Not on file  . Number of children: 2  . Years of education: Not on file  . Highest education level: Not on file  Occupational History  . Occupation: disabled  Social Needs  . Financial resource strain: Not on file  . Food insecurity  Worry: Not on file    Inability: Not on file  . Transportation needs    Medical: Not on file    Non-medical: Not on file  Tobacco Use  . Smoking status: Never Smoker  . Smokeless tobacco: Never Used  Substance and Sexual Activity  . Alcohol use: No    Alcohol/week: 0.0 standard drinks  . Drug use: No  . Sexual activity: Not on file  Lifestyle  . Physical activity    Days per week: Not on file    Minutes per session: Not on file  . Stress: Not on file  Relationships  . Social Herbalist on phone: Not on file    Gets together: Not on file    Attends religious service: Not on file    Active member of club or organization: Not on file    Attends meetings of clubs or organizations: Not on file    Relationship status: Not on file  Other Topics Concern  .  Not on file  Social History Narrative  . Not on file    Additional Social History:   Allergies:   Allergies  Allergen Reactions  . Penicillins Anaphylaxis  . Darvon [Propoxyphene] Other (See Comments)  . Sulfonamide Derivatives Other (See Comments)    Stomach Spasms   . Codeine Itching and Rash  . Ivp Dye [Iodinated Diagnostic Agents] Rash    Metabolic Disorder Labs: No results found for: HGBA1C, MPG No results found for: PROLACTIN No results found for: CHOL, TRIG, HDL, CHOLHDL, VLDL, LDLCALC Lab Results  Component Value Date   TSH 0.81 11/17/2009    Therapeutic Level Labs: No results found for: LITHIUM No results found for: CBMZ No results found for: VALPROATE  Current Medications: Current Outpatient Medications  Medication Sig Dispense Refill  . ALPRAZolam (XANAX) 0.5 MG tablet Take 0.5 mg by mouth 4 (four) times daily as needed for anxiety.    Marland Kitchen amitriptyline (ELAVIL) 25 MG tablet Take 25 mg by mouth daily.    . cephALEXin (KEFLEX) 500 MG capsule Take 1 capsule by mouth 2 (two) times daily.    . DULoxetine (CYMBALTA) 60 MG capsule 1  qam 30 capsule 3  . gabapentin (NEURONTIN) 300 MG capsule Take 300 mg by mouth 2 (two) times daily.    Marland Kitchen latanoprost (XALATAN) 0.005 % ophthalmic solution Place 1 drop into both eyes at bedtime.    Marland Kitchen LINZESS 145 MCG CAPS capsule TAKE (1) CAPSULE DAILY BEFORE BREAKFAST. 30 capsule 0  . Multiple Vitamins-Minerals (MULTIVITAMIN WITH MINERALS) tablet Take 1 tablet by mouth daily.    . Omega-3 Fatty Acids (FISH OIL) 1000 MG CAPS Take 1 g by mouth daily.    Marland Kitchen PATADAY 0.2 % SOLN Place 1 drop into both eyes daily.    . SF 5000 PLUS 1.1 % CREA dental cream Take 1 application by mouth 2 (two) times daily.     No current facility-administered medications for this visit.     Musculoskeletal: Strength & Muscle Tone: Normal gait & Station: normal Patient leans: N/A  Psychiatric Specialty Exam: ROS  There were no vitals taken for this  visit.There is no height or weight on file to calculate BMI.  General Appearance: Casual  Eye Contact:  Good  Speech:  Clear and Coherent  Volume:  Normal  Mood:  Negative  Affect:  Congruent  Thought Process:  Goal Directed  Orientation:  Full (Time, Place, and Person)  Thought Content:  WDL  Suicidal Thoughts:  No  Homicidal Thoughts:  No  Memory:  Negative  Judgement:  Fair  Insight:  Fair  Psychomotor Activity:  Normal  Concentration:    Recall:  Poor  Fund of Knowledge:Poor  Language: Fair  Akathisia:  No  Handed:  Right  AIMS (if indicated):  not done  Assets:  Desire for Improvement  ADL's:    Cognition: Impaired,  Mild  Sleep:  Fair   Screenings:   Assessment and Plan:  At this time her diagnosis is still a question.  The patient has significant pain and sounds like she is been treated for depression in the past.  At this time we will ask her to discontinue his her Celexa 40 mg and to continue taking her Cymbalta that she is been taking at 30 mg.  We will ask her to discontinue her Celexa and to go ahead and increase her Cymbalta to the full dose of 60 mg hopefully for pain and for depression.  She will continue taking Xanax from her primary care doctor 0.5 mg 4 times daily and continue taking gabapentin 300 mg 3 times daily probably for pain.  Patient was started in therapy and will be reevaluated in 2 and half months.   Jerral Ralph, MD 8/5/20202:30 PM

## 2019-03-24 ENCOUNTER — Ambulatory Visit (HOSPITAL_COMMUNITY): Payer: Medicare Other | Admitting: Licensed Clinical Social Worker

## 2019-04-01 ENCOUNTER — Ambulatory Visit (HOSPITAL_COMMUNITY): Payer: Medicare Other | Admitting: Licensed Clinical Social Worker

## 2019-04-01 ENCOUNTER — Other Ambulatory Visit: Payer: Self-pay

## 2019-04-02 ENCOUNTER — Other Ambulatory Visit (HOSPITAL_COMMUNITY): Payer: Self-pay | Admitting: Psychiatry

## 2019-04-11 ENCOUNTER — Telehealth (HOSPITAL_COMMUNITY): Payer: Self-pay

## 2019-04-11 NOTE — Telephone Encounter (Signed)
Patient called, she is not doing well on the increased Cymbalta. She states that she is nervous - checking things twice, confusion and she is eating more than she should. Please review and advise, thank you

## 2019-04-17 NOTE — Telephone Encounter (Signed)
I called patient back and she is agreeable to speaking to her PCP about the Xanax. Patient is also concerned about low energy and weight gain and would lik,e to know if you have any ideas how to increase her energy so that she wants to do things again. Patient states she has no desire to do housework, cook, or even leave the house.

## 2019-05-05 ENCOUNTER — Telehealth: Payer: Self-pay | Admitting: Nurse Practitioner

## 2019-05-05 NOTE — Telephone Encounter (Signed)
Pt has been dealing with constipation for a while, she would like some advise.

## 2019-05-05 NOTE — Telephone Encounter (Signed)
Patient is complaining of 2 weeks of worsening constipation. She feels this is related to some new medications she is taking, including Cymbalta and Celebrex. She denies any present use of narcotic medication other than Alprazolam. The patient was taking 1 tablespoon of Miralax 3 times daily. She has tried an enema. States she had poor return. She now has an incontinent leakage of liquid stool without passing formed stool. She has started having indigestion. Please advise.

## 2019-05-05 NOTE — Telephone Encounter (Signed)
Patient is advised.  She will call back in a couple of days with a follow up. We will schedule her appointment at that time. She has to have 5 days advanced notice to obtain transportation for appointments.

## 2019-05-05 NOTE — Telephone Encounter (Signed)
I had prescribed Linzess, is she not on that?  If having incontinence then may be overflow diarrhea from impaction. She can purge bowel with mg citrate and see if that helps. Can we get her an appointment, it has been several months since I saw her. Thanks

## 2019-05-06 ENCOUNTER — Ambulatory Visit (INDEPENDENT_AMBULATORY_CARE_PROVIDER_SITE_OTHER): Payer: Medicare Other | Admitting: Licensed Clinical Social Worker

## 2019-05-06 DIAGNOSIS — F4312 Post-traumatic stress disorder, chronic: Secondary | ICD-10-CM

## 2019-05-06 DIAGNOSIS — F331 Major depressive disorder, recurrent, moderate: Secondary | ICD-10-CM | POA: Diagnosis not present
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
## 2019-05-07 ENCOUNTER — Telehealth: Payer: Self-pay | Admitting: Gastroenterology

## 2019-05-07 NOTE — Telephone Encounter (Signed)
Carla Tran, I have not seen her in several months.  If she soiling herself she could have overflow diarrhea due to actually being constipated.  We can carry out the plan as laid forth when I saw her several months ago.  She would need to purge her bowels again with magnesium [facility] then start Linzess.  If she never got a prescription for it then I would start her at 145 mcg daily on an empty stomach.  Thank you

## 2019-05-07 NOTE — Telephone Encounter (Signed)
Spoke with the patient. She does not know what Linzess is and cannot recall having been on it.  Patient did use the Mag Citrate for her purge. She has had "stools like toothpaste." However she still feels bloated and uncomfortable. She states she is soiling her pants without knowing it. She cannot come in next week due to "surgery" with another healthcare provider. Agrees to an appointment 05/28/19. Anything you want her to try in the meantime?

## 2019-05-08 ENCOUNTER — Other Ambulatory Visit: Payer: Self-pay

## 2019-05-08 MED ORDER — LINACLOTIDE 145 MCG PO CAPS: ORAL_CAPSULE | ORAL | 0 refills | Status: DC

## 2019-05-08 NOTE — Telephone Encounter (Signed)
Discussed with the patient. She agrees to this plan of action. Rx to the [facility].

## 2019-05-28 ENCOUNTER — Encounter: Payer: Self-pay | Admitting: Gastroenterology

## 2019-05-28 ENCOUNTER — Ambulatory Visit (INDEPENDENT_AMBULATORY_CARE_PROVIDER_SITE_OTHER): Payer: Medicare Other | Admitting: Gastroenterology

## 2019-05-28 VITALS — BP 118/82 | HR 96 | Temp 98.5°F | Ht 62.0 in | Wt 167.1 lb

## 2019-05-28 DIAGNOSIS — Z1159 Encounter for screening for other viral diseases: Secondary | ICD-10-CM

## 2019-05-28 DIAGNOSIS — R198 Other specified symptoms and signs involving the digestive system and abdomen: Secondary | ICD-10-CM

## 2019-05-28 MED ORDER — LINACLOTIDE 72 MCG PO CAPS: 72.0000 ug | ORAL_CAPSULE | Freq: Every day | ORAL | 11 refills | Status: DC

## 2019-05-28 MED ORDER — PEG 3350-KCL-NA BICARB-NACL 420 G PO SOLR: 4000.0000 mL | ORAL | 0 refills | Status: DC

## 2019-05-28 NOTE — Patient Instructions (Signed)
You have been scheduled for a colonoscopy. Please follow written instructions given to you at your visit today.  Please pick up your prep supplies at the pharmacy within the next 1-3 days. If you use inhalers (even only as needed), please bring them with you on the day of your procedure. Your physician has requested that you go to www.startemmi.com and enter the access code given to you at your visit today. This web site gives a general overview about your procedure. However, you should still follow specific instructions given to you by our office regarding your preparation for the procedure.  Due to recent COVID-19 restrictions implemented by [employer] and state authorities and in an effort to keep both patients and staff as safe as possible, [facility] requires COVID-19 testing prior to any scheduled endoscopic procedure. The testing center is located at [address] [place], [place] [address] in the [facility] suite.  Your appointment has been scheduled for 06/27/19 on 9am.   Please bring your insurance cards to this appointment. You will require your COVID screen 2 business days prior to your endoscopic procedure.  You are not required to quarantine after your screening.  You will only receive a phone call with the results if it is POSITIVE.  If you do not receive a call the day before your procedure you should begin your prep, if ordered, and you should report to the endo center for your procedure at your designated appointment arrival time ( one hour prior to the procedure time). There is no cost to you for the screening on the day of the swab.  [facility] will file with your insurance company for the testing.    You may receive an automated phone call prior to your procedure or have a message in your MyChart that you have an appointment for a BP/15 at the [facility], please disregard this message.  Your testing will be at  the [address] [place] [facility] location.   Stop Linzess 181mcg   We have sent the following medications to your pharmacy for you to pick up at your convenience:Linzess 72 mcg  Please start taking Citrucel(orange flavored) powder supplement.This may cause some bloating at first,but that usually goes away.Begin with a small spoonful and work your way up to a large,heaping spoonful daily over a week  Thank you for entrusting me with your care and choosing [facility].  Dr Ardis Hughs

## 2019-05-28 NOTE — Progress Notes (Signed)
HPI: This is a pleasant 68 year old woman whom I have not seen in 8 years.  She was here however and saw Nevin Bloodgood about a year ago.  Colonoscopy 2012 Dr. Ardis Hughs for chronic diarrhea showed normal terminal ileum, normal colonic mucosa, no polyps or cancers.  Colonoscopy 2011 for the same was very poorly prepped, random colon biopsies were however performed and they were all normal, no microscopic colitis.  She was here in our office about 1 year ago and she saw Nevin Bloodgood for alternating constipation diarrhea.  She was having to digitally disimpact herself occasionally.  She was recommended to stop taking her antidiarrheal medicines.  Her bowels were purge with mag citrate and then she was to start Linzess if insurance will pay for it.  She called about 1 month later because she had not gotten her magnesium citrate from the pharmacy.  It was explained more clearly that this was over-the-counter.  It looks like she never started the [place].  A prescription for 145 mcg pills 1 pill once daily was called in again for her about 1 month ago so that she can finally start it.  Since starting the Linzess 145 mcg pills she has had a lot of loose stools.  She is woken up in the middle the night with fecal incontinence.  She has had no bleeding.  She told me she took 8 Imodium before coming here today to see me.  Her weight has been up 30 pounds in the past 6 months.  Colon cancer does not run in her family  Chief complaint is constipation alternating with diarrhea  ROS: complete GI ROS as described in HPI, all other review negative.  Constitutional:  No unintentional weight loss   Past Medical History:  Diagnosis Date  . Anxiety   . Bursitis    shoulder  . Cervical cancer ([place])   . Depression   . DJD (degenerative joint disease)   . Endometriosis   . Fibromyalgia   . Head ache   . PTSD (post-traumatic stress disorder)   . Skin cancer   . Stroke [facility])     Past Surgical History:  Procedure  Laterality Date  . BACK SURGERY    . CHOLECYSTECTOMY    . ligaments removed from elbow    . PARTIAL HYSTERECTOMY    . skin cancer removed     from face  . TUBAL LIGATION      Current Outpatient Medications  Medication Sig Dispense Refill  . ALPRAZolam (XANAX) 0.5 MG tablet Take 0.5 mg by mouth 4 (four) times daily as needed for anxiety.    . Citalopram Hydrobromide (CELEXA PO) citalopram    . cyclobenzaprine (FLEXERIL) 10 MG tablet     . DULoxetine (CYMBALTA) 60 MG capsule 1  qam 30 capsule 3  . gabapentin (NEURONTIN) 300 MG capsule Take 300 mg by mouth 2 (two) times daily.    Marland Kitchen latanoprost (XALATAN) 0.005 % ophthalmic solution Place 1 drop into both eyes at bedtime.    . Multiple Vitamins-Minerals (MULTIVITAMIN WITH MINERALS) tablet Take 1 tablet by mouth daily.    . Omega-3 Fatty Acids (FISH OIL) 1000 MG CAPS Take 1 g by mouth daily.    Marland Kitchen PATADAY 0.2 % SOLN Place 1 drop into both eyes daily.     No current facility-administered medications for this visit.     Allergies as of 05/28/2019 - Review Complete 05/28/2019  Allergen Reaction Noted  . Penicillins Anaphylaxis   . Darvon [propoxyphene] Other (See  Comments) 04/19/2014  . Sulfonamide derivatives Other (See Comments)   . Codeine Itching and Rash   . Ivp dye [iodinated diagnostic agents] Rash 04/19/2014    Family History  Problem Relation Age of Onset  . Diabetes Mother   . Prostate cancer Father   . Breast cancer Maternal Aunt   . Colon cancer Neg Hx   . Esophageal cancer Neg Hx     Social History   Socioeconomic History  . Marital status: Divorced    Spouse name: Not on file  . Number of children: 2  . Years of education: Not on file  . Highest education level: Not on file  Occupational History  . Occupation: disabled  Social Needs  . Financial resource strain: Not on file  . Food insecurity    Worry: Not on file    Inability: Not on file  . Transportation needs    Medical: Not on file    Non-medical:  Not on file  Tobacco Use  . Smoking status: Never Smoker  . Smokeless tobacco: Never Used  Substance and Sexual Activity  . Alcohol use: No    Alcohol/week: 0.0 standard drinks  . Drug use: No  . Sexual activity: Not on file  Lifestyle  . Physical activity    Days per week: Not on file    Minutes per session: Not on file  . Stress: Not on file  Relationships  . Social Herbalist on phone: Not on file    Gets together: Not on file    Attends religious service: Not on file    Active member of club or organization: Not on file    Attends meetings of clubs or organizations: Not on file    Relationship status: Not on file  . Intimate partner violence    Fear of current or ex partner: Not on file    Emotionally abused: Not on file    Physically abused: Not on file    Forced sexual activity: Not on file  Other Topics Concern  . Not on file  Social History Narrative  . Not on file     Physical Exam: BP 118/82 (BP Location: Right Arm, Patient Position: Sitting, Cuff Size: Normal)   Pulse 96   Temp 98.5 F (36.9 C) (Oral)   Ht 5\' 2"  (1.575 m)   Wt 167 lb 2 oz (75.8 kg)   BMI 30.57 kg/m  Constitutional: generally well-appearing Psychiatric: alert and oriented x3 Abdomen: soft, nontender, nondistended, no obvious ascites, no peritoneal signs, normal bowel sounds No peripheral edema noted in lower extremities  Assessment and plan: 68 y.o. female with bowel changes, predominantly diarrhea lately  I think the 145 mcg Linzess is probably a bit too strong for her.  Going to decrease her to the 72 mcg pill dose.  She will take 1 pill once daily.  She will also take fiber supplements Citrucel on a daily basis.  Given the significant change in her bowels over the past several months I recommended repeat colonoscopy, her last one was 8 years ago.  I see no reason for any further blood tests or imaging studies prior to that.  Please see the "Patient Instructions" section for  addition details about the plan.  Owens Loffler, MD [facility] 05/28/2019, 3:14 PM

## 2019-06-04 ENCOUNTER — Other Ambulatory Visit: Payer: Self-pay

## 2019-06-04 ENCOUNTER — Ambulatory Visit (INDEPENDENT_AMBULATORY_CARE_PROVIDER_SITE_OTHER): Payer: Medicare Other | Admitting: Psychiatry

## 2019-06-04 DIAGNOSIS — F332 Major depressive disorder, recurrent severe without psychotic features: Secondary | ICD-10-CM

## 2019-06-04 MED ORDER — DULOXETINE HCL 60 MG PO CPEP: ORAL_CAPSULE | ORAL | 4 refills | Status: DC

## 2019-06-04 NOTE — Progress Notes (Signed)
Psychiatric Initial Adult Assessment   Patient Identification: Carla Tran MRN:  [id number] Date of Evaluation:  06/04/2019 Referral Source: [name] Chief Complaint:   Visit Diagnosis: Major depression chronic  History of Present Illness:  At this time the patient describes continual depression.  She also has a lot of pain.  Doing everything we can to avoid polypharmacy.  Nonetheless the only medicine she takes for me is Cymbalta 60 mg.  It is hard to tell if it is helped very much.  Today I am going to double it but effects on reducing pain as well as depression.  She seems to be eating okay and sleeping fairly well.  She takes Xanax from her primary care doctor which helps her sleep and helps her with her anxiety.  She takes a variety of other medicines.  Today her major complaint is that she sees too many doctors and is having multiple procedures.  In fact she has not come back to see me for a good 4 to 5 months.  The good news is the patient is in therapy here seeing her therapist on a regular basis.  I think this is very beneficial for her.  Patient lives alone.  She is very isolated.  She is bothered by the pandemic.  She is not suicidal and she is not psychotic.  She denies the use of alcohol or drugs. Associated Signs/Symptoms: Depression Symptoms:  disturbed sleep, (Hypo) Manic Symptoms:   Anxiety Symptoms:   Psychotic Symptoms:   PTSD Symptoms:   Past Psychiatric History: Under the care of Dr. Ronnald Ramp and hospitalized for alcohol treatment  Previous Psychotropic Medications: Xanax 0.5 mg 4 times daily, Celexa 40 mg, Seroquel unclear dose  Substance Abuse History in the last 12 months:  Yes.    Consequences of Substance Abuse:   Past Medical History:  Past Medical History:  Diagnosis Date  . Anxiety   . Bursitis    shoulder  . Cervical cancer ([place])   . Depression   . DJD (degenerative joint disease)   . Endometriosis   . Fibromyalgia   . Head ache   . PTSD  (post-traumatic stress disorder)   . Skin cancer   . Stroke [facility])     Past Surgical History:  Procedure Laterality Date  . BACK SURGERY    . CHOLECYSTECTOMY    . ligaments removed from elbow    . PARTIAL HYSTERECTOMY    . skin cancer removed     from face  . TUBAL LIGATION      Family Psychiatric History:   Family History:  Family History  Problem Relation Age of Onset  . Diabetes Mother   . Prostate cancer Father   . Breast cancer Maternal Aunt   . Colon cancer Neg Hx   . Esophageal cancer Neg Hx     Social History:   Social History   Socioeconomic History  . Marital status: Divorced    Spouse name: Not on file  . Number of children: 2  . Years of education: Not on file  . Highest education level: Not on file  Occupational History  . Occupation: disabled  Social Needs  . Financial resource strain: Not on file  . Food insecurity    Worry: Not on file    Inability: Not on file  . Transportation needs    Medical: Not on file    Non-medical: Not on file  Tobacco Use  . Smoking status: Never Smoker  . Smokeless tobacco: Never  Used  Substance and Sexual Activity  . Alcohol use: No    Alcohol/week: 0.0 standard drinks  . Drug use: No  . Sexual activity: Not on file  Lifestyle  . Physical activity    Days per week: Not on file    Minutes per session: Not on file  . Stress: Not on file  Relationships  . Social Herbalist on phone: Not on file    Gets together: Not on file    Attends religious service: Not on file    Active member of club or organization: Not on file    Attends meetings of clubs or organizations: Not on file    Relationship status: Not on file  Other Topics Concern  . Not on file  Social History Narrative  . Not on file    Additional Social History:   Allergies:   Allergies  Allergen Reactions  . Penicillins Anaphylaxis  . Darvon [Propoxyphene] Other (See Comments)  . Sulfonamide Derivatives Other (See Comments)     Stomach Spasms   . Codeine Itching and Rash  . Ivp Dye [Iodinated Diagnostic Agents] Rash    Metabolic Disorder Labs: No results found for: HGBA1C, MPG No results found for: PROLACTIN No results found for: CHOL, TRIG, HDL, CHOLHDL, VLDL, LDLCALC Lab Results  Component Value Date   TSH 0.81 11/17/2009    Therapeutic Level Labs: No results found for: LITHIUM No results found for: CBMZ No results found for: VALPROATE  Current Medications: Current Outpatient Medications  Medication Sig Dispense Refill  . ALPRAZolam (XANAX) 0.5 MG tablet Take 0.5 mg by mouth 4 (four) times daily as needed for anxiety.    . Citalopram Hydrobromide (CELEXA PO) citalopram    . cyclobenzaprine (FLEXERIL) 10 MG tablet     . DULoxetine (CYMBALTA) 60 MG capsule 2  qam 60 capsule 4  . gabapentin (NEURONTIN) 300 MG capsule Take 300 mg by mouth 2 (two) times daily.    Marland Kitchen latanoprost (XALATAN) 0.005 % ophthalmic solution Place 1 drop into both eyes at bedtime.    Marland Kitchen linaclotide (LINZESS) 72 MCG capsule Take 1 capsule (72 mcg total) by mouth daily. 30 capsule 11  . Multiple Vitamins-Minerals (MULTIVITAMIN WITH MINERALS) tablet Take 1 tablet by mouth daily.    . Omega-3 Fatty Acids (FISH OIL) 1000 MG CAPS Take 1 g by mouth daily.    Marland Kitchen PATADAY 0.2 % SOLN Place 1 drop into both eyes daily.    . polyethylene glycol-electrolytes (NULYTELY/GOLYTELY) 420 g solution Take 4,000 mLs by mouth as directed. 4000 mL 0   No current facility-administered medications for this visit.     Musculoskeletal: Strength & Muscle Tone: Normal gait & Station: normal Patient leans: N/A  Psychiatric Specialty Exam: ROS  There were no vitals taken for this visit.There is no height or weight on file to calculate BMI.  General Appearance: Casual  Eye Contact:  Good  Speech:  Clear and Coherent  Volume:  Normal  Mood:  Negative  Affect:  Congruent  Thought Process:  Goal Directed  Orientation:  Full (Time, Place, and Person)   Thought Content:  WDL  Suicidal Thoughts:  No  Homicidal Thoughts:  No  Memory:  Negative  Judgement:  Fair  Insight:  Fair  Psychomotor Activity:  Normal  Concentration:    Recall:  Poor  Fund of Knowledge:Poor  Language: Fair  Akathisia:  No  Handed:  Right  AIMS (if indicated):  not done  Assets:  Desire  for Improvement  ADL's:    Cognition: Impaired,  Mild  Sleep:  Fair   Screenings:   Assessment and Plan:  This patient is #1 problem is that of major depression.  Today we will going to double her Cymbalta taking 120 mg.  Noted is that she is off Celexa.  She does get Xanax and gabapentin from her primary care doctor.  Importantly the patient is in therapy and will continue with therapy.  She will return to see in 4 months. Jerral Ralph, MD 10/21/20203:05 PM

## 2019-06-19 ENCOUNTER — Other Ambulatory Visit: Payer: Self-pay

## 2019-06-19 ENCOUNTER — Ambulatory Visit (INDEPENDENT_AMBULATORY_CARE_PROVIDER_SITE_OTHER): Payer: Medicare Other | Admitting: Licensed Clinical Social Worker

## 2019-06-19 DIAGNOSIS — F4312 Post-traumatic stress disorder, chronic: Secondary | ICD-10-CM

## 2019-06-19 DIAGNOSIS — F331 Major depressive disorder, recurrent, moderate: Secondary | ICD-10-CM | POA: Diagnosis not present

## 2019-06-19 NOTE — Progress Notes (Signed)
Virtual Visit via Telephone Note  I connected with Carla Tran on 06/19/19 at  9:00 AM EST by telephone and verified that I am speaking with the correct person using two identifiers.   I discussed the limitations, risks, security and privacy concerns of performing an evaluation and management service by telephone and the availability of in person appointments. I also discussed with the patient that there may be a patient responsible charge related to this service. The patient expressed understanding and agreed to proceed.  Follow Up Instructions:    I discussed the assessment and treatment plan with the patient. The patient was provided an opportunity to ask questions and all were answered. The patient agreed with the plan and demonstrated an understanding of the instructions.   The patient was advised to call back or seek an in-person evaluation if the symptoms worsen or if the condition fails to improve as anticipated.  I provided 52 minutes of non-face-to-face time during this encounter.   THERAPIST PROGRESS NOTE  Session Time: 9:02 AM to 9:54 AM  Participation Level: Active  Behavioral Response: AlertAnxious, Dysphoric and In session patient was also euthymic  Type of Therapy: Individual Therapy  Treatment Goals addressed:  Increase energy, lose weight, a more positive outlook on life, decrease depression, trauma, coping Interventions: CBT, Solution Focused, Strength-based, Supportive, Reframing and Other: coping  Summary: Carla Tran is a 68 y.o. femalewho presents with continues to get surgeries for basal carcinoma. The other one was for her spine that she didn't even know was going to have. 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  Treatment is for injections shares that she hopes of will go for 4 months. I got off of narcotics because they didn't agree with me. Takes shots lasts 3-4 months. Went 7 months without shots and had to deal with a lot of pain.   Reports pain has better after procedure. It was so bad before could hardly walk. December 17 colonoscopy surgery find out about IBS, right now doctor has given her something to help with bowel movements and have had for a long time. therapist pointed out her own impression to patient that this would be a main stressor but appears a little better than before there is more control of bowels. Patient shares still difficult because either difficulty in being able to go to the bathroom or difficulty controlling it. Therapist pointed out how it is a stressor as it has significant impact on mobility. Patient describes it as horrible.. Patient provided description of how things have been for her that a lot going on this month, with COVID, horrible, going through things with health, like in prison. Haven't had time for other testing like bone marrow testing, pap smear. Stress of setting up transportation. Wears her out with long distance driving, causes spinal stress to sit in vehicle, people don't understand. Talked to children. They have their own life and try to be there for them and listen.  Therapist pointed out last session she felt the children did not care and this was positive news to hear a more involvement with the kids.  Patient elaborated that does not get that much support from them and also realizes they have their own life.    Patient shares that mental status bad when in all the time. Tired. I don't have any energy. Most of the time behind closed blinds. Therapist related that this would increase depression. Patient says sit there and worry about children. Help with neighbor  best I can, she has a mess. Can't lift things with back. Talking about Dr. Donnetta Simpers her a need due to doing things around the house will pull on her back.  Patient points out she does not do anything where she would need an aide, also through discussion it appears patient does not want to give up all activity as significant issue for her  is losing some of the weight she is gained.  Therapist pointed out that a positive relationship could develop pointed out she had a support of a woman who comes in once a month to help her out with household chores.  Shares don't do anything. Sit around and gaining weight. Both mental and physical causing low energy.  Therapist discussed theroretical basis for behavioral Activation.  Reviewed that when we are depressed we gravitate to activities that negatively reinforce depression.  That we isolate and we stop engaging in activities and why treatment strategy is to engage in activities that positively reinforce [place].  Activities such as being active engaging in activities.  Patient interrupted therapist and therapist assess this as some resistance and patient wanted to discuss meds so spent a little time reviewing medications.  Therapist went back to topic encourage patient that if she cannot get outside to at least set small goals for herself as helpful.  Patient reviewed difficulty of mobility for her that has cartilage so when walks knee buckles and and afraid to fall, has IBS issues. Patient reviewed treatment goals and patient gave verbal consent to do virtually.  Patient identifies important goal for her is losing weight so assess this is 1 good place to start with helping with engaging more in activities that will help with mood as well as feeling better about herself when losing weight. Reviewing treatment goals patient said hard to look forward to anything.  Therapist said that would be a good goal for therapy is to have her look more forward to things.  Patient asked how that was possible and therapist pointed this out as a goal to move toward along with needing to address medical issues to give her more time to fill her schedule with other things.  In reviewing patient's current status patient pointed out friends moved away or died.  Do not know people and complex and do not have activities anymore.   Therapist reframed to identify positive contact with kids, also she did identify supports and treatment plan.  Reviewed patient supports as significant to help patient with coping.  Provided strength based on supportive intervention. Therapist worked with patient on processing feelings related to stressors Suicidal/Homicidal: No  Plan: Return again in 4-5 weeks.2.  Therapist work with patient on challenging negative thoughts, decrease in depresion  Diagnosis: Axis I: Major depressive Disorder, recurrent, moderate, PTSD    Axis II: No diagnosis    [name], LCSW 06/19/2019

## 2019-06-27 ENCOUNTER — Other Ambulatory Visit: Payer: Self-pay | Admitting: Gastroenterology

## 2019-06-27 ENCOUNTER — Ambulatory Visit (INDEPENDENT_AMBULATORY_CARE_PROVIDER_SITE_OTHER): Payer: Medicare Other

## 2019-06-27 DIAGNOSIS — Z1159 Encounter for screening for other viral diseases: Secondary | ICD-10-CM

## 2019-06-30 LAB — SARS CORONAVIRUS 2 (TAT 6-24 HRS): SARS Coronavirus 2: NEGATIVE

## 2019-07-01 ENCOUNTER — Encounter: Payer: Self-pay | Admitting: Gastroenterology

## 2019-07-01 ENCOUNTER — Other Ambulatory Visit: Payer: Self-pay

## 2019-07-01 ENCOUNTER — Ambulatory Visit (AMBULATORY_SURGERY_CENTER): Payer: Medicare Other | Admitting: Gastroenterology

## 2019-07-01 VITALS — BP 133/77 | HR 84 | Temp 97.8°F | Resp 20 | Ht 62.0 in | Wt 167.0 lb

## 2019-07-01 DIAGNOSIS — R198 Other specified symptoms and signs involving the digestive system and abdomen: Secondary | ICD-10-CM

## 2019-07-01 DIAGNOSIS — Z538 Procedure and treatment not carried out for other reasons: Secondary | ICD-10-CM | POA: Diagnosis not present

## 2019-07-01 MED ORDER — SODIUM CHLORIDE 0.9 % IV SOLN: 500.0000 mL | Freq: Once | INTRAVENOUS | Status: DC

## 2019-07-01 NOTE — Progress Notes (Signed)
PT taken to PACU. Monitors in place. VSS. Report given to RN. 

## 2019-07-01 NOTE — Op Note (Signed)
[place] Patient Name: Carla Tran Procedure Date: 07/01/2019 10:14 AM MRN: [id number] Endoscopist: Milus Banister , MD Age: 68 Referring MD:  Date of Birth: December 08, 1950 Gender: Female Account #: [id number] Procedure:                Colonoscopy Indications:              Change in bowel habits Medicines:                Monitored Anesthesia Care Procedure:                Pre-Anesthesia Assessment:                           - Prior to the procedure, a History and Physical                            was performed, and patient medications and                            allergies were reviewed. The patient's tolerance of                            previous anesthesia was also reviewed. The risks                            and benefits of the procedure and the sedation                            options and risks were discussed with the patient.                            All questions were answered, and informed consent                            was obtained. Prior Anticoagulants: The patient has                            taken no previous anticoagulant or antiplatelet                            agents. ASA Grade Assessment: II - A patient with                            mild systemic disease. After reviewing the risks                            and benefits, the patient was deemed in                            satisfactory condition to undergo the procedure.                           After obtaining informed consent, the colonoscope  was passed under direct vision. Throughout the                            procedure, the patient's blood pressure, pulse, and                            oxygen saturations were monitored continuously. The                            Colonoscope was introduced through the anus with                            the intention of advancing to the ileum. The scope                            was advanced to the descending  colon before the                            procedure was aborted. Medications were given. The                            colonoscopy was performed without difficulty. The                            patient tolerated the procedure well. The quality                            of the bowel preparation was poor. Scope In: 10:38:44 AM Scope Out: 10:42:27 AM Total Procedure Duration: 0 hours 3 minutes 43 seconds  Findings:                 A large amount of liquid and soft stool was found                            throughout the examined colon (to the descending                            segment). This precluded adequate examination and                            the procedure was aborted. Complications:            No immediate complications. Estimated blood loss:                            None. Estimated Blood Loss:     Estimated blood loss: none. Impression:               - Preparation of the colon was poor and the                            procedure was aborted Recommendation:           - Patient has a contact number available for  emergencies. The signs and symptoms of potential                            delayed complications were discussed with the                            patient. Return to normal activities tomorrow.                            Written discharge instructions were provided to the                            patient.                           - Resume previous diet.                           - Continue present medications.                           - Repeat colonoscopy at the next available                            appointment because the bowel preparation was poor                            (with double prep protocol) Milus Banister, MD 07/01/2019 10:47:24 AM This report has been signed electronically.

## 2019-07-01 NOTE — Progress Notes (Signed)
Patient needed to have another colonoscopy due to the stool in her colon.  She is aware of her next pre-visit, and next colonoscopy.  I stressed to her that she must follow directions. Forwarded to pre-visit.

## 2019-07-01 NOTE — Patient Instructions (Signed)
Please read all of the handouts given to you by your recovery room nurse.  Please, follow all of the directions given to you by your pre-visit nurse. You're next appointment is booked  YOU HAD AN ENDOSCOPIC PROCEDURE TODAY AT [place]:   Refer to the procedure report that was given to you for any specific questions about what was found during the examination.  If the procedure report does not answer your questions, please call your gastroenterologist to clarify.  If you requested that your care partner not be given the details of your procedure findings, then the procedure report has been included in a sealed envelope for you to review at your convenience later.  YOU SHOULD EXPECT: Some feelings of bloating in the abdomen. Passage of more gas than usual.  Walking can help get rid of the air that was put into your GI tract during the procedure and reduce the bloating. If you had a lower endoscopy (such as a colonoscopy or flexible sigmoidoscopy) you may notice spotting of blood in your stool or on the toilet paper. If you underwent a bowel prep for your procedure, you may not have a normal bowel movement for a few days.  Please Note:  You might notice some irritation and congestion in your nose or some drainage.  This is from the oxygen used during your procedure.  There is no need for concern and it should clear up in a day or so.  SYMPTOMS TO REPORT IMMEDIATELY:   Following lower endoscopy (colonoscopy or flexible sigmoidoscopy):  Excessive amounts of blood in the stool  Significant tenderness or worsening of abdominal pains  Swelling of the abdomen that is new, acute  Fever of 100F or higher   For urgent or emergent issues, a gastroenterologist can be reached at any hour by calling [phone].   DIET:  We do recommend a small meal at first, but then you may proceed to your regular diet.  Drink plenty of fluids but you should avoid alcoholic beverages for 24  hours.  ACTIVITY:  You should plan to take it easy for the rest of today and you should NOT DRIVE or use heavy machinery until tomorrow (because of the sedation medicines used during the test).    FOLLOW UP: Our staff will call the number listed on your records 48-72 hours following your procedure to check on you and address any questions or concerns that you may have regarding the information given to you following your procedure. If we do not reach you, we will leave a message.  We will attempt to reach you two times.  During this call, we will ask if you have developed any symptoms of COVID 19. If you develop any symptoms (ie: fever, flu-like symptoms, shortness of breath, cough etc.) before then, please call [phone].  If you test positive for Covid 19 in the 2 weeks post procedure, please call and report this information to [place].    If any biopsies were taken you will be contacted by phone or by letter within the next 1-3 weeks.  Please call us at [phone] if you have not heard about the biopsies in 3 weeks.    SIGNATURES/CONFIDENTIALITY: You and/or your care partner have signed paperwork which will be entered into your electronic medical record.  These signatures attest to the fact that that the information above on your After Visit Summary has been reviewed and is understood.  Full responsibility of the confidentiality of this discharge  information lies with you and/or your care-partner. 

## 2019-07-01 NOTE — Progress Notes (Signed)
Temp check by:LC Vital check by:CW  The medical and surgical history was reviewed and verified with the patient. 

## 2019-07-03 ENCOUNTER — Telehealth: Payer: Self-pay

## 2019-07-03 NOTE — Telephone Encounter (Signed)
  Follow up Call-  Call back number 07/01/2019  Post procedure Call Back phone  # [phone]  Permission to leave phone message Yes  Some recent data might be hidden     Patient questions:  Do you have a fever, pain , or abdominal swelling? No. Pain Score  0 *  Have you tolerated food without any problems? Yes.    Have you been able to return to your normal activities? Yes.    Do you have any questions about your discharge instructions: Diet   No. Medications  No. Follow up visit  No.  Do you have questions or concerns about your Care? No.  Actions: * If pain score is 4 or above: No action needed, pain <4.  1. Have you developed a fever since your procedure? No  2.   Have you had an respiratory symptoms (SOB or cough) since your procedure? No  3.   Have you tested positive for COVID 19 since your procedure No  4.   Have you had any family members/close contacts diagnosed with the COVID 19 since your procedure?  No   If yes to any of these questions please route to Joylene John, RN and Alphonsa Gin, RN.

## 2019-07-15 ENCOUNTER — Telehealth: Payer: Self-pay | Admitting: Gastroenterology

## 2019-07-15 NOTE — Telephone Encounter (Signed)
Pt stated that she had not cancel colonoscopy due to lack of transportation.  She reported abdominal pain, bloating and and early satiety.  Please advise.

## 2019-07-15 NOTE — Telephone Encounter (Signed)
The pt has cancelled her procedure and pre visit.  She continues to have bloating and constipation.  I advised her that she needs to reschedule the procedure and pre visit so that she can be evaluated.  She says she will call back when she gets a ride.Marland Kitchen

## 2019-07-22 ENCOUNTER — Encounter: Payer: Medicare Other | Admitting: Gastroenterology

## 2019-07-23 ENCOUNTER — Encounter: Payer: Self-pay | Admitting: Gastroenterology

## 2019-07-23 ENCOUNTER — Ambulatory Visit (INDEPENDENT_AMBULATORY_CARE_PROVIDER_SITE_OTHER): Payer: Medicare Other | Admitting: Licensed Clinical Social Worker

## 2019-07-23 DIAGNOSIS — M4692 Unspecified inflammatory spondylopathy, cervical region: Secondary | ICD-10-CM | POA: Insufficient documentation

## 2019-07-23 DIAGNOSIS — F4312 Post-traumatic stress disorder, chronic: Secondary | ICD-10-CM

## 2019-07-23 DIAGNOSIS — F331 Major depressive disorder, recurrent, moderate: Secondary | ICD-10-CM

## 2019-07-23 DIAGNOSIS — M47812 Spondylosis without myelopathy or radiculopathy, cervical region: Secondary | ICD-10-CM | POA: Insufficient documentation

## 2019-07-23 DIAGNOSIS — M961 Postlaminectomy syndrome, not elsewhere classified: Secondary | ICD-10-CM | POA: Insufficient documentation

## 2019-07-23 DIAGNOSIS — M47817 Spondylosis without myelopathy or radiculopathy, lumbosacral region: Secondary | ICD-10-CM | POA: Insufficient documentation

## 2019-07-23 DIAGNOSIS — M7918 Myalgia, other site: Secondary | ICD-10-CM | POA: Insufficient documentation

## 2019-07-23 DIAGNOSIS — R4182 Altered mental status, unspecified: Secondary | ICD-10-CM | POA: Insufficient documentation

## 2019-07-23 DIAGNOSIS — M47816 Spondylosis without myelopathy or radiculopathy, lumbar region: Secondary | ICD-10-CM | POA: Insufficient documentation

## 2019-07-23 DIAGNOSIS — M169 Osteoarthritis of hip, unspecified: Secondary | ICD-10-CM | POA: Insufficient documentation

## 2019-07-23 DIAGNOSIS — M542 Cervicalgia: Secondary | ICD-10-CM | POA: Insufficient documentation

## 2019-07-23 NOTE — Telephone Encounter (Signed)
The pt has been scheduled for colon and previsit and cancelled, She now wants to have an [place] but I advised her that she needs to reschedule her colon and previsit.  The pt states she will call back to set up.

## 2019-07-23 NOTE — Telephone Encounter (Signed)
Pt reported that she is still experiencing nausea and pain and requested an [place].

## 2019-07-23 NOTE — Progress Notes (Addendum)
Virtual Visit via Telephone Note  I connected with Carla Tran on 07/23/19 at 10:00 AM EST by telephone and verified that I am speaking with the correct person using two identifiers.   I discussed the limitations, risks, security and privacy concerns of performing an evaluation and management service by telephone and the availability of in person appointments. I also discussed with the patient that there may be a patient responsible charge related to this service. The patient expressed understanding and agreed to proceed.  I discussed the assessment and treatment plan with the patient. The patient was provided an opportunity to ask questions and all were answered. The patient agreed with the plan and demonstrated an understanding of the instructions.   The patient was advised to call back or seek an in-person evaluation if the symptoms worsen or if the condition fails to improve as anticipated.  I provided 55 minutes of non-face-to-face time during this encounter.  THERAPIST PROGRESS NOTE  Session Time: 10:01 AM to 10:56 AM  Participation Level: Active  Behavioral Response: CasualAlertAnxious  Type of Therapy: Individual Therapy  Treatment Goals addressed:  Increase energy, lose weight, a more positive outlook on life, decrease depression, trauma, coping Interventions: Solution Focused, Strength-based, Supportive, Reframing and Other: trauma review, coping  Summary: Carla Tran is a 68 y.o. femalewho presents with something serious going on with intestines, IBS, concern about blockage. Plans to call PCP about an x-ray. Bloated and constipation. Has had IBS for 30 years and first time said couldn't get through went under for colonoscopy. Struggles with managing many medical issues. Struggles with IBS. Not had breast exam, pelvic exam (has had cervix cancer). Hard to keep up with everything. Talked about another doctor she has for spine and getting shots including knee. "I have a shot for  every bone for every bone in my body". Thinks miss the spot for one of the injections.  Overwhelming dealing with medical issues. Having diarrhea or constipation. It hurts, back or intestines. Wakes up with diarrhea. Going to get nurse. Needs to get more things for medical care. Walk keep active, does a puzzle to keep busy. Can't focus on on morning prayer and it is getting worse. Has been diagnosed with attention deficit, in conversation pointed out that she jumps around although patient thinks she is finished with it. She has been put on Adderall in past and thinks lower dose will help. Her PCP encouraged her to get help for attention deficit recently. Explored ways that anxiety impacts concentration too. There is Christmas, getting parts for the car, medical issues. Wants to stay independent as an anxiety issue, helps to get around and keep busy in activities.  Shared with therapist something that bothers her really bad. She was separated with husband and seeing someone. Son was 41 years old. Ended up getting pregnant. Related involved in two relationships with Carla Tran and Carla Tran. Before Carla Tran born. (With Carla Tran 20 years and had children with him). Didn't want to have an abortion. Both of her partners willing to pay for it. Also considered that she couldn't pay to raise a child at that point. Remembered as a child someone being called a "bast..". child and remembers how bad that was.Carla Tran would say something like that. Every day feels bad. Feels it is murder. Therapist guided patient in feeling less guilt about her choice and more self-forgiveness. Patient shares that we all make choices and feels she made the wrong choice. (Added that she had her daughter, Carla Tran, 2 years later)  Meant to have two children, guys left it up to her.  Another traumatic episode for patient was finding her sister, Carla Tran, who lived next door to her dead. Patient was about 59. Had COPD, over medicated, narcotics, beside her and found  her dead. Shared about the funeral that people came "done what they done and then left me". Blood pressures sky high, couldn't breath. Been through a lot. Wants to talk more about childhood next time.       Suicidal/Homicidal: No  Therapist Response: Reviewed symptoms, discussed stressors, facilitated expression of thoughts and feelings.  Identified ADD that has been an issue for patient and explored how anxiety is impacting concentration.  Processed feelings related to stressors to help with anxiety.  Discussed talking to patient's psychiatrist about whether medications would be appropriate for focus.  Acces in session patient having  rapid thoughts and rapid speech, will continue to explore symptoms for accurate diagnosis.  Provided strength based intervention and discussing patient being involved with her care as helpful in addressing her issues as well as choosing to get off narcotics to address pain.  Processed feelings related to past trauma of abortion reframing to help patient decrease guilt, helping her understand why she made the choices she did and best that she could make at the time.  Encouraged patient to work on not beating herself up and more self Compassion.  And process feelings related to finding her sister dead to help her work on that trauma.  Plan: Return again in 4-5 weeks.2.Review childhood continue talking about sister she found dead.3.Patient review medication for attention deficit with doctor. 4.Therapist work with patient on challenging negative thoughts, decrease in depresson   Diagnosis: Axis I: Major depressive Disorder, recurrent, moderate, PTSD    Axis II: No diagnosis    [name], LCSW 07/23/2019

## 2019-07-29 ENCOUNTER — Ambulatory Visit (AMBULATORY_SURGERY_CENTER): Payer: Medicare Other | Admitting: *Deleted

## 2019-07-29 ENCOUNTER — Other Ambulatory Visit: Payer: Self-pay

## 2019-07-29 ENCOUNTER — Encounter: Payer: Self-pay | Admitting: Gastroenterology

## 2019-07-29 VITALS — Temp 96.8°F | Ht 62.0 in | Wt 169.0 lb

## 2019-07-29 DIAGNOSIS — Z1159 Encounter for screening for other viral diseases: Secondary | ICD-10-CM

## 2019-07-29 DIAGNOSIS — R198 Other specified symptoms and signs involving the digestive system and abdomen: Secondary | ICD-10-CM

## 2019-07-29 NOTE — Progress Notes (Signed)
No egg or soy allergy known to patient  No issues with past sedation with any surgeries  or procedures, no intubation problems  No diet pills per patient No home 02 use per patient  No blood thinners per patient  Pt denies issues with constipation  No A fib or A flutter  EMMI video sent to pt's e mail  Pt and I discussed her 2 day Miralax Miralax prep today several times-  She did have a lot of questions and I encouraged her to call with any questions after leaving [facility] today - pt states she did drink her entire Golytely prep last colon 06-2019 but was an incomplete colon as poor prep - I instructed her to take her Linzess daily and use the Miralax daily as she has been directed - she told me she is having a daily BM that is soft currently  Due to the COVID-19 pandemic we are asking patients to follow these guidelines. Please only bring one care partner. Please be aware that your care partner may wait in the car in the parking lot or if they feel like they will be too hot to wait in the car, they may wait in the lobby on the 4th floor. All care partners are required to wear a mask the entire time (we do not have any that we can provide them), they need to practice social distancing, and we will do a Covid check for all patient's and care partners when you arrive. Also we will check their temperature and your temperature. If the care partner waits in their car they need to stay in the parking lot the entire time and we will call them on their cell phone when the patient is ready for discharge so they can bring the car to the front of the building. Also all patient's will need to wear a mask into building.

## 2019-08-01 ENCOUNTER — Ambulatory Visit (INDEPENDENT_AMBULATORY_CARE_PROVIDER_SITE_OTHER): Payer: Medicare Other

## 2019-08-01 DIAGNOSIS — Z1159 Encounter for screening for other viral diseases: Secondary | ICD-10-CM

## 2019-08-01 LAB — SARS CORONAVIRUS 2 (TAT 6-24 HRS): SARS Coronavirus 2: NEGATIVE

## 2019-08-06 ENCOUNTER — Encounter: Payer: Self-pay | Admitting: Gastroenterology

## 2019-08-06 ENCOUNTER — Other Ambulatory Visit: Payer: Self-pay

## 2019-08-06 ENCOUNTER — Ambulatory Visit (AMBULATORY_SURGERY_CENTER): Payer: Medicare Other | Admitting: Gastroenterology

## 2019-08-06 VITALS — BP 114/39 | HR 76 | Temp 98.1°F | Resp 21 | Ht 62.0 in | Wt 169.0 lb

## 2019-08-06 DIAGNOSIS — K5900 Constipation, unspecified: Secondary | ICD-10-CM

## 2019-08-06 DIAGNOSIS — R194 Change in bowel habit: Secondary | ICD-10-CM

## 2019-08-06 DIAGNOSIS — R198 Other specified symptoms and signs involving the digestive system and abdomen: Secondary | ICD-10-CM

## 2019-08-06 MED ORDER — SODIUM CHLORIDE 0.9 % IV SOLN: 500.0000 mL | Freq: Once | INTRAVENOUS | Status: DC

## 2019-08-06 NOTE — Op Note (Signed)
[place] Patient Name: Carla Tran Procedure Date: 08/06/2019 11:05 AM MRN: [id number] Endoscopist: Milus Banister , MD Age: 68 Referring MD:  Date of Birth: 02/18/1951 Gender: Female Account #: [id number] Procedure:                Colonoscopy Indications:              Change in bowel habits, constipation Medicines:                Monitored Anesthesia Care Procedure:                Pre-Anesthesia Assessment:                           - Prior to the procedure, a History and Physical                            was performed, and patient medications and                            allergies were reviewed. The patient's tolerance of                            previous anesthesia was also reviewed. The risks                            and benefits of the procedure and the sedation                            options and risks were discussed with the patient.                            All questions were answered, and informed consent                            was obtained. Prior Anticoagulants: The patient has                            taken no previous anticoagulant or antiplatelet                            agents. ASA Grade Assessment: II - A patient with                            mild systemic disease. After reviewing the risks                            and benefits, the patient was deemed in                            satisfactory condition to undergo the procedure.                           After obtaining informed consent, the colonoscope  was passed under direct vision. Throughout the                            procedure, the patient's blood pressure, pulse, and                            oxygen saturations were monitored continuously. The                            Colonoscope was introduced through the anus and                            advanced to the the cecum, identified by                            appendiceal orifice and  ileocecal valve. The                            colonoscopy was performed without difficulty. The                            patient tolerated the procedure well. The quality                            of the bowel preparation was good. The ileocecal                            valve, appendiceal orifice, and rectum were                            photographed. Scope In: 11:12:19 AM Scope Out: 11:28:33 AM Scope Withdrawal Time: 0 hours 8 minutes 8 seconds  Total Procedure Duration: 0 hours 16 minutes 14 seconds  Findings:                 The entire examined colon appeared normal on direct                            and retroflexion views. Complications:            No immediate complications. Estimated blood loss:                            None. Estimated Blood Loss:     Estimated blood loss: none. Impression:               - The entire examined colon is normal on direct and                            retroflexion views.                           - No polyps or cancers. Recommendation:           - Patient has a contact number available for  emergencies. The signs and symptoms of potential                            delayed complications were discussed with the                            patient. Return to normal activities tomorrow.                            Written discharge instructions were provided to the                            patient.                           - Resume previous diet.                           - Continue present medications. Continue the lower                            dose (60mcg) Linzess one pill once daily, it seems                            to be helping quite well.                           - Repeat colonoscopy in 10 years for screening. Milus Banister, MD 08/06/2019 11:31:45 AM This report has been signed electronically.

## 2019-08-06 NOTE — Progress Notes (Signed)
To pacu, VSS. Report to [email or web address] 

## 2019-08-06 NOTE — Patient Instructions (Signed)
You may resume your previous diet and medication schedule.  Continue taking the 72 mcg Linzess once a day.  Thank you for allowing [place] to care for you today!!!  YOU HAD AN ENDOSCOPIC PROCEDURE TODAY AT [place]:   Refer to the procedure report that was given to you for any specific questions about what was found during the examination.  If the procedure report does not answer your questions, please call your gastroenterologist to clarify.  If you requested that your care partner not be given the details of your procedure findings, then the procedure report has been included in a sealed envelope for you to review at your convenience later.  YOU SHOULD EXPECT: Some feelings of bloating in the abdomen. Passage of more gas than usual.  Walking can help get rid of the air that was put into your GI tract during the procedure and reduce the bloating. If you had a lower endoscopy (such as a colonoscopy or flexible sigmoidoscopy) you may notice spotting of blood in your stool or on the toilet paper. If you underwent a bowel prep for your procedure, you may not have a normal bowel movement for a few days.  Please Note:  You might notice some irritation and congestion in your nose or some drainage.  This is from the oxygen used during your procedure.  There is no need for concern and it should clear up in a day or so.  SYMPTOMS TO REPORT IMMEDIATELY:   Following lower endoscopy (colonoscopy or flexible sigmoidoscopy):  Excessive amounts of blood in the stool  Significant tenderness or worsening of abdominal pains  Swelling of the abdomen that is new, acute  Fever of 100F or higher  For urgent or emergent issues, a gastroenterologist can be reached at any hour by calling [phone].   DIET:  We do recommend a small meal at first, but then you may proceed to your regular diet.  Drink plenty of fluids but you should avoid alcoholic beverages for 24 hours.  ACTIVITY:  You should  plan to take it easy for the rest of today and you should NOT DRIVE or use heavy machinery until tomorrow (because of the sedation medicines used during the test).    FOLLOW UP: Our staff will call the number listed on your records 48-72 hours following your procedure to check on you and address any questions or concerns that you may have regarding the information given to you following your procedure. If we do not reach you, we will leave a message.  We will attempt to reach you two times.  During this call, we will ask if you have developed any symptoms of COVID 19. If you develop any symptoms (ie: fever, flu-like symptoms, shortness of breath, cough etc.) before then, please call [phone].  If you test positive for Covid 19 in the 2 weeks post procedure, please call and report this information to [place].    If any biopsies were taken you will be contacted by phone or by letter within the next 1-3 weeks.  Please call us at [phone] if you have not heard about the biopsies in 3 weeks.    SIGNATURES/CONFIDENTIALITY: You and/or your care partner have signed paperwork which will be entered into your electronic medical record.  These signatures attest to the fact that that the information above on your After Visit Summary has been reviewed and is understood.  Full responsibility of the confidentiality of this discharge information lies with you  care-partner.  

## 2019-08-06 NOTE — Progress Notes (Signed)
Temp  LC  VS  DT  Pt's states no medical or surgical changes since previsit or office visit.    

## 2019-08-11 ENCOUNTER — Telehealth: Payer: Self-pay | Admitting: *Deleted

## 2019-08-11 NOTE — Telephone Encounter (Signed)
1. Have you developed a fever since your procedure? no  2.   Have you had an respiratory symptoms (SOB or cough) since your procedure? no  3.   Have you tested positive for COVID 19 since your procedure no  4.   Have you had any family members/close contacts diagnosed with the COVID 19 since your procedure?  no   If yes to any of these questions please route to Joylene John, RN and Alphonsa Gin, Therapist, sports.  Follow up Call-  Call back number 08/06/2019 07/01/2019  Post procedure Call Back phone  # [phone]  Permission to leave phone message Yes Yes  Some recent data might be hidden     Patient questions:  Do you have a fever, pain , or abdominal swelling? No. Pain Score  0 *  Have you tolerated food without any problems? Yes.    Have you been able to return to your normal activities? Yes.    Do you have any questions about your discharge instructions: Diet   No. Medications  No. Follow up visit  No.  Do you have questions or concerns about your Care? No.  Actions: * If pain score is 4 or above: No action needed, pain <4.pt is not having problems from procedure but does say having diarrhea now from [place]. Pt says have decreased dosage to 72 mcg but she has not taken it at all today. Pt says she will contact Dr Ardis Hughs office to see whether to continue [place].

## 2019-08-20 ENCOUNTER — Ambulatory Visit (HOSPITAL_COMMUNITY): Payer: Medicare Other | Admitting: Licensed Clinical Social Worker

## 2019-09-08 ENCOUNTER — Ambulatory Visit (HOSPITAL_COMMUNITY): Payer: Medicare Other | Admitting: Licensed Clinical Social Worker

## 2019-09-11 ENCOUNTER — Ambulatory Visit (INDEPENDENT_AMBULATORY_CARE_PROVIDER_SITE_OTHER): Payer: Medicare Other | Admitting: Licensed Clinical Social Worker

## 2019-09-11 DIAGNOSIS — F331 Major depressive disorder, recurrent, moderate: Secondary | ICD-10-CM | POA: Diagnosis not present

## 2019-09-11 DIAGNOSIS — F4312 Post-traumatic stress disorder, chronic: Secondary | ICD-10-CM
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
## 2019-09-30 ENCOUNTER — Other Ambulatory Visit: Payer: Self-pay

## 2019-09-30 ENCOUNTER — Ambulatory Visit (INDEPENDENT_AMBULATORY_CARE_PROVIDER_SITE_OTHER): Payer: Medicare Other | Admitting: Psychiatry

## 2019-09-30 DIAGNOSIS — F329 Major depressive disorder, single episode, unspecified: Secondary | ICD-10-CM | POA: Diagnosis not present

## 2019-09-30 DIAGNOSIS — F339 Major depressive disorder, recurrent, unspecified: Secondary | ICD-10-CM

## 2019-09-30 MED ORDER — DULOXETINE HCL 60 MG PO CPEP: ORAL_CAPSULE | ORAL | 4 refills | Status: DC
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
## 2019-10-09 ENCOUNTER — Ambulatory Visit (HOSPITAL_COMMUNITY): Payer: Medicare Other | Admitting: Psychiatry

## 2019-10-16 ENCOUNTER — Ambulatory Visit (HOSPITAL_COMMUNITY): Payer: Medicare Other | Admitting: Licensed Clinical Social Worker

## 2019-10-27 ENCOUNTER — Ambulatory Visit (INDEPENDENT_AMBULATORY_CARE_PROVIDER_SITE_OTHER): Payer: Medicare Other | Admitting: Licensed Clinical Social Worker

## 2019-10-27 DIAGNOSIS — F339 Major depressive disorder, recurrent, unspecified: Secondary | ICD-10-CM

## 2019-10-27 DIAGNOSIS — F4312 Post-traumatic stress disorder, chronic: Secondary | ICD-10-CM | POA: Diagnosis not present
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
## 2019-12-31 ENCOUNTER — Telehealth (INDEPENDENT_AMBULATORY_CARE_PROVIDER_SITE_OTHER): Payer: Medicare Other | Admitting: Psychiatry

## 2019-12-31 ENCOUNTER — Other Ambulatory Visit: Payer: Self-pay

## 2019-12-31 DIAGNOSIS — F329 Major depressive disorder, single episode, unspecified: Secondary | ICD-10-CM

## 2019-12-31 MED ORDER — DULOXETINE HCL 60 MG PO CPEP: ORAL_CAPSULE | ORAL | 6 refills | Status: DC

## 2019-12-31 NOTE — Progress Notes (Signed)
Psychiatric Initial Adult Assessment   Patient Identification: Carla Tran MRN:  [id number] Date of Evaluation:  12/31/2019 Referral Source: [name] Chief Complaint:   Visit Diagnosis: Major depression chronic  History of Present Illness:   Today the patient seems to be about her baseline.  She has multiple medical issues.  She has irritable bowel she has problems with the results of cataract surgery and other problems.  She has a seem depressed but she is very distressed by all her medical problems and how hard it is to get to her doctors.  She also apparently has significant skin cancer has had multiple surgeries.  Patient continues in therapy and seems to get benefit from it.  Patient is sleeping and eating well.  Her energy level is fair.  She drinks no alcohol uses no drugs.  She is not psychotic.  She gets her Xanax and her gabapentin from her primary care doctor.  Overall the patient showed little change over the last year.  I think she is mainly physically afflicted by her medical conditions and probably difficulty communicating.  The patient been having dental issues for you a year.  She is having problems getting her dentures.  So she has ophthalmological problems, dental problems and GI problems.  They clearly have a big impact on her life. Depression Symptoms:  disturbed sleep, (Hypo) Manic Symptoms:   Anxiety Symptoms:   Psychotic Symptoms:   PTSD Symptoms:   Past Psychiatric History: Under the care of Dr. Ronnald Ramp and hospitalized for alcohol treatment  Previous Psychotropic Medications: Xanax 0.5 mg 4 times daily, Celexa 40 mg, Seroquel unclear dose  Substance Abuse History in the last 12 months:  Yes.    Consequences of Substance Abuse:   Past Medical History:  Past Medical History:  Diagnosis Date  . Allergy   . Anxiety   . Bursitis    shoulder  . Cataract    forming   . Cervical cancer ([place])   . Depression   . DJD (degenerative joint disease)   .  Endometriosis   . Fibromyalgia   . Glaucoma   . Head ache   . Irregular heart beat   . PTSD (post-traumatic stress disorder)   . PTSD (post-traumatic stress disorder)   . Seizures ([place])    x 1 only- in early 2000's and none since   . Skin cancer   . Stroke [facility])     Past Surgical History:  Procedure Laterality Date  . BACK SURGERY    . CHOLECYSTECTOMY    . COLONOSCOPY    . ligaments removed from elbow    . PARTIAL HYSTERECTOMY    . skin cancer removed     from face x 2, abdomen and arm left   . TUBAL LIGATION      Family Psychiatric History:   Family History:  Family History  Problem Relation Age of Onset  . Diabetes Mother   . Prostate cancer Father   . Breast cancer Maternal Aunt   . Diabetes Sister   . Esophageal cancer Sister   . Colon cancer Neg Hx   . Rectal cancer Neg Hx   . Stomach cancer Neg Hx   . Colon polyps Neg Hx     Social History:   Social History   Socioeconomic History  . Marital status: Divorced    Spouse name: Not on file  . Number of children: 2  . Years of education: Not on file  . Highest education level: Not on  file  Occupational History  . Occupation: disabled  Tobacco Use  . Smoking status: Never Smoker  . Smokeless tobacco: Never Used  Substance and Sexual Activity  . Alcohol use: No    Alcohol/week: 0.0 standard drinks  . Drug use: No  . Sexual activity: Not on file  Other Topics Concern  . Not on file  Social History Narrative  . Not on file   Social Determinants of Health   Financial Resource Strain:   . Difficulty of Paying Living Expenses:   Food Insecurity:   . Worried About Charity fundraiser in the Last Year:   . Arboriculturist in the Last Year:   Transportation Needs:   . Film/video editor (Medical):   Marland Kitchen Lack of Transportation (Non-Medical):   Physical Activity:   . Days of Exercise per Week:   . Minutes of Exercise per Session:   Stress:   . Feeling of Stress :   Social Connections:   .  Frequency of Communication with Friends and Family:   . Frequency of Social Gatherings with Friends and Family:   . Attends Religious Services:   . Active Member of Clubs or Organizations:   . Attends Archivist Meetings:   Marland Kitchen Marital Status:     Additional Social History:   Allergies:   Allergies  Allergen Reactions  . Penicillins Anaphylaxis  . Haemophilus Influenzae Vaccines Rash  . Mirtazapine Other (See Comments)    Pt states this medication made her sleep walk.  . Darvon [Propoxyphene] Other (See Comments)  . Sulfonamide Derivatives Other (See Comments)    Stomach Spasms   . Codeine Itching and Rash  . Ivp Dye [Iodinated Diagnostic Agents] Rash    Metabolic Disorder Labs: No results found for: HGBA1C, MPG No results found for: PROLACTIN No results found for: CHOL, TRIG, HDL, CHOLHDL, VLDL, LDLCALC Lab Results  Component Value Date   TSH 0.81 11/17/2009    Therapeutic Level Labs: No results found for: LITHIUM No results found for: CBMZ No results found for: VALPROATE  Current Medications: Current Outpatient Medications  Medication Sig Dispense Refill  . ALPRAZolam (XANAX) 0.5 MG tablet Take 0.5 mg by mouth 4 (four) times daily as needed for anxiety.    . celecoxib (CELEBREX) 100 MG capsule Take 100 mg by mouth daily.    . cyclobenzaprine (FLEXERIL) 10 MG tablet     . DULoxetine (CYMBALTA) 60 MG capsule 2  qam 60 capsule 6  . gabapentin (NEURONTIN) 300 MG capsule Take 300 mg by mouth 2 (two) times daily.    Marland Kitchen latanoprost (XALATAN) 0.005 % ophthalmic solution Place 1 drop into both eyes at bedtime.    Marland Kitchen linaclotide (LINZESS) 72 MCG capsule Take 1 capsule (72 mcg total) by mouth daily. 30 capsule 11  . Multiple Vitamins-Minerals (MULTIVITAMIN WITH MINERALS) tablet Take 1 tablet by mouth daily.    . Omega-3 Fatty Acids (FISH OIL) 1000 MG CAPS Take 1 g by mouth daily.    Marland Kitchen PATADAY 0.2 % SOLN Place 1 drop into both eyes daily.     No current  facility-administered medications for this visit.    Musculoskeletal: Strength & Muscle Tone: Normal gait & Station: normal Patient leans: N/A  Psychiatric Specialty Exam: ROS  There were no vitals taken for this visit.There is no height or weight on file to calculate BMI.  General Appearance: Casual  Eye Contact:  Good  Speech:  Clear and Coherent  Volume:  Normal  Mood:  Negative  Affect:  Congruent  Thought Process:  Goal Directed  Orientation:  Full (Time, Place, and Person)  Thought Content:  WDL  Suicidal Thoughts:  No  Homicidal Thoughts:  No  Memory:  Negative  Judgement:  Fair  Insight:  Fair  Psychomotor Activity:  Normal  Concentration:    Recall:  Poor  Fund of Knowledge:Poor  Language: Fair  Akathisia:  No  Handed:  Right  AIMS (if indicated):  not done  Assets:  Desire for Improvement  ADL's:    Cognition: Impaired,  Mild  Sleep:  Fair   Screenings:   Assessment and Plan: 5/19/20212:44 PM   This patient clearly has a history of major depression.  Should continue taking her Cymbalta as ordered.  Once again her Xanax and gabapentin come from her primary care doctor.  The patient clearly gets a benefit by being in therapy.  She will return with me in approximately 6 months.

## 2020-01-14 ENCOUNTER — Ambulatory Visit (INDEPENDENT_AMBULATORY_CARE_PROVIDER_SITE_OTHER): Payer: Medicare Other | Admitting: Licensed Clinical Social Worker

## 2020-01-14 DIAGNOSIS — F329 Major depressive disorder, single episode, unspecified: Secondary | ICD-10-CM

## 2020-01-14 DIAGNOSIS — F4312 Post-traumatic stress disorder, chronic: Secondary | ICD-10-CM

## 2020-01-14 NOTE — Progress Notes (Addendum)
Virtual Visit via Telephone Note  Therapist-home, patient-home I connected with Carla Tran on 01/14/20 at 11:00 AM EDT by telephone and verified that I am speaking with the correct person using two identifiers.   I discussed the limitations, risks, security and privacy concerns of performing an evaluation and management service by telephone and the availability of in person appointments. I also discussed with the patient that there may be a patient responsible charge related to this service. The patient expressed understanding and agreed to proceed.   I discussed the assessment and treatment plan with the patient. The patient was provided an opportunity to ask questions and all were answered. The patient agreed with the plan and demonstrated an understanding of the instructions.   The patient was advised to call back or seek an in-person evaluation if the symptoms worsen or if the condition fails to improve as anticipated.  I provided 50 minutes of non-face-to-face time during this encounter.   THERAPIST PROGRESS NOTE  Session Time: 11:10 AM to 12:00 PM  Participation Level: Active  Behavioral Response: CasualAlertAnxious, Depressed and Euthymic  Type of Therapy: Individual Therapy  Treatment Goals addressed:  Increase energy, lose weight, a more positive outlook on life, decrease depression, trauma, coping  Interventions: CBT, Solution Focused, Strength-based, Supportive, Reframing and Other: coping  Summary: [name] is a 69 y.o. female who presents with anxiety related to medical appointments. Her main issues right now is anxiety about cutting off cataracts and believes that the doctor messed up. The left eye hurt really bad, blurry and sees a spot on pupil. Third time she is going back to see him.  Therapist guided patient in plan of having specific questions for him and see if she feels good about the answers, other options include getting a second opinion, explore seeing an  attorney patient feels she needs to. Doctor is Dr. Alanda Slim  Other medical appointments include surgeries for basal cell carcinoma, managing IBS.  Explored with patient his medical advice and she relates he is telling her it will take longer for her eye issues.  Concerned the surgery is done while she has glaucoma.  Eye issues have been going on since April and went to see them again in May.  Patient described in general distress of having to go see so many doctors.  Other stressors or other evaluation she needs such as needs breast exam, pelvic exam because had cancer there.  Feels nothing to live for because only deals with medical appointments.  Therapist challenged her on this all or nothing thinking saying there has to be some things that are positive happening.  Patient relates she would like to get out more, and explored with her options she is interested in the church and to go to service church activities.  She has been exploring this and therapist encouraged her to continue to explore pointing out needing to take the initiative to put things in place and make positive events happen.  Assess patient shows some  willingness to be proactive as well as an interest and this indicates progress in working on her goal of depression.  In assessing mood patient does show an interest in going out and enjoying herself and needs to be encouraged to take steps to explore her opportunities such as going to be vfw, [employer] that she enjoys going. To. Messed up lower back again. Doing things she shouldn't. Teeth wouldn't stay in had realigned. 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 . Needs brace. Concern of  bending over and mess up nerve again. Careful of shots bad experience with too strong medications and driving. Glaucoma worried about getting treatment for this.  Depressed, doesn't want to do anything, stay home behind closed door, in the dark can't sleep. Won't take OxyContin and Oxycodone, in pain.  Therapist pointed  this out as a positive that patient does not want to be put on addictive medicine.  Shared stress about Medicare letter and plan is for her to call somebody to help explain it to her.  Kids come now and then. Can't forgive herself for abortion, kill child.  Therapist challenged her on this saying she has to be kind and compassionate to her self recognize she made a mistake that mistakes are part of life and also utilizing spirituality which is significant coping for her that God forgive with her.  Not helpful for her to stay feeling guilt but way to healthy approach is to work through guilt by learning from it and moving forward.  Shared she had a rough childhood. Oldest sister wasn't by father. Oldest sister by cop. 7 girls, Wilmon Arms Rhonda-somebody else's, Chales Abrahams, Dina-patient isolated. Forgave them but not forgive. Not bring around people who bring her down again. They says she thinks she is better than them. Can't trust them and what they did and hurts. There was not reason to leave her out. Family poor, mom and dad alcoholics. Missing so much school. Nobody to send them. When dad in jail, no boys, dad treated like boys. Rough upbringing. 19 own thing, on own and taking care of herself.  Therapist pointed this out as a strength and patient agrees it was amazing and she still does that today.  Reviewed course of conversation of patient going from one place to another therapist agreed. Need something to stay focused.  Reviewed more of her history she was married for 20 years and then 41 years, dating a guy for 11 years doing all kind of crazy things, drug addict, meth, scared of drugs. 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 Spirituality helps her with things, helped her to stop drinking, help her through each day. Looking for church friends are encouraging her to go to social activity at. [employer].  Reviewed problems with focus and patient relates diagnosed by [name], other psychiatrist with ADD and  believes has ADHD.     Suicidal/Homicidal: No  Plan: Return again in 3 weeks.2.  Therapist work with patient on depression, anxiety, encouraging her to get out and do things to enjoy herself  Diagnosis: Axis I:  Major depressive Disorder, recurrent, chronic, PTSD chronic    Axis II: No diagnosis     [name], LCSW 01/14/2020

## 2020-02-05 ENCOUNTER — Ambulatory Visit (INDEPENDENT_AMBULATORY_CARE_PROVIDER_SITE_OTHER): Payer: Medicare Other | Admitting: Licensed Clinical Social Worker

## 2020-02-05 DIAGNOSIS — F4312 Post-traumatic stress disorder, chronic: Secondary | ICD-10-CM

## 2020-02-05 DIAGNOSIS — F329 Major depressive disorder, single episode, unspecified: Secondary | ICD-10-CM

## 2020-02-05 NOTE — Progress Notes (Signed)
Virtual Visit via Telephone Note  Therapist-home office, patient-home I connected with Carla Tran on 02/05/20 at  1:00 PM EDT by telephone and verified that I am speaking with the correct person using two identifiers.   I discussed the limitations, risks, security and privacy concerns of performing an evaluation and management service by telephone and the availability of in person appointments. I also discussed with the patient that there may be a patient responsible charge related to this service. The patient expressed understanding and agreed to proceed.   I discussed the assessment and treatment plan with the patient. The patient was provided an opportunity to ask questions and all were answered. The patient agreed with the plan and demonstrated an understanding of the instructions.   The patient was advised to call back or seek an in-person evaluation if the symptoms worsen or if the condition fails to improve as anticipated.  I provided 55 minutes of non-face-to-face time during this encounter.  THERAPIST PROGRESS NOTE  Session Time: 1:00 PM to 1:55 PM  Participation Level: Active  Behavioral Response: CasualAlertAngry, Anxious and Dysphoric  Type of Therapy: Individual Therapy  Treatment Goals addressed:  decrease depression, trauma, coping, stress management particularly medical issues as they play a significant part in depression and anxiety  Interventions: Solution Focused, Strength-based, Supportive and Other: coping  Summary: Carla Tran is a 69 y.o. female who presents with all over the place and can't help it, embarrassing, wants therapist to touch base on medication. Now has issues with knee and has other issues with eye, IBS, skin cancer, has to have another coloscopy afraid to leave apartment afraid going to have an accident. Angry. Having the same thing at home and horrible. Ever since she was young had diarrhea and older the worse it gets. Everyday. Thinks it is the  stomach.  Does not want another colonoscopy but thinks she needs an ultrasound plans to call Dr. Later today to request that. Her stomach swollen under her breast. Can't get out at all to enjoy herself. Tired of this. Diagnosed with IBS but doesn't think that it is. Angry after talking to Medicare and issues that come up with it. Owe money from 2018 and that bothers from two years ago owes this money. Needs to address issues with Mohs.(related to having skin cancer) Eye doctor gave her wrong prescription and can't afford new prescription. Always going to cancer doctor, awful whole life is doctors and want to have fun. Doesn't want eye doctor to look at eye after what happened. Sees in one eye but leaves her off balance. Left eye messed up. Dr Truman Hayward regular doctor for eyes. Dr. Brooke Dare the specialist. Find out what is going with eye with Dr.Lee. Describes gurgling under breath doesn't hurt, but weird and scary. As back up talked to her [name] PCP say won't have colonoscopy. Wanted to go to [facility] last week but couldn't go because of diarrhea.Met someone recently, funny always want to take her somewhere thinks dodging but issues with stomach.  Reviewed if she is able to address and medical issues improve that will help a lot with her ability to enjoy life.  Suicidal/Homicidal: No  Therapist Response: Therapist reviewed symptoms, facilitated expression of thoughts and feelings patient gave verbal consent to complete treatment plan review virtually and reviewed goals issue most significantly as her medical issues holding her back from being able to enjoy herself.  Engaged with patient and problem solving to address medical issues a good strategy moving forward to  find out underlying cause of it is going on with eyes and stomach.  Review her need to get an appointment sooner with psychiatrist to address focus issues and medication to help her slow down.  Therapist gauged an open questions, active listening,  therapeutic support, validated patient how she was feeling as well as strength based interventions.  The goal is for patient to address medical issues so that she can get out and enjoy herself, assessed positive development despite all the stressors of potential for relationship with somebody.  Plan: Return again in 4 weeks.2.  Therapist provided supportive interventions, help patient in developing strategy to help her address medical issues  Diagnosis: Axis I:  Major depressive Disorder, recurrent, chronic, PTSD chronic   Axis II: No diagnosis    [name], LCSW 02/05/2020

## 2020-02-09 ENCOUNTER — Telehealth: Payer: Self-pay | Admitting: Gastroenterology

## 2020-02-09 NOTE — Telephone Encounter (Signed)
The pt wanted to have an [place] completed of her stomach she says "I have been bloated for 16 years"  I tried to make an appt with Dr Ardis Hughs first available and she declined saying it is not soon enough.  I offered an extender and she states I need you to order an [place].  I explained that I can not order an [place] without a provider order.  She states yes you can I can do it myself.  I offered again an extender or Dr Ardis Hughs appt and she said I will call my PCP and hung up.

## 2020-03-02 ENCOUNTER — Emergency Department (HOSPITAL_COMMUNITY): Payer: Medicare Other

## 2020-03-02 ENCOUNTER — Encounter (HOSPITAL_COMMUNITY): Payer: Self-pay | Admitting: Emergency Medicine

## 2020-03-02 ENCOUNTER — Other Ambulatory Visit: Payer: Self-pay

## 2020-03-02 ENCOUNTER — Emergency Department (HOSPITAL_COMMUNITY)
Admission: EM | Admit: 2020-03-02 | Discharge: 2020-03-02 | Disposition: A | Discharge status: Home / Self Care | Payer: Medicare Other | Attending: Emergency Medicine | Admitting: Emergency Medicine

## 2020-03-02 DIAGNOSIS — Z8541 Personal history of malignant neoplasm of cervix uteri: Secondary | ICD-10-CM | POA: Diagnosis not present

## 2020-03-02 DIAGNOSIS — Z85828 Personal history of other malignant neoplasm of skin: Secondary | ICD-10-CM | POA: Diagnosis not present

## 2020-03-02 DIAGNOSIS — R1013 Epigastric pain: Secondary | ICD-10-CM | POA: Insufficient documentation

## 2020-03-02 DIAGNOSIS — R079 Chest pain, unspecified: Secondary | ICD-10-CM | POA: Diagnosis not present

## 2020-03-02 LAB — BASIC METABOLIC PANEL
Anion gap: 12 (ref 5–15)
BUN: 12 mg/dL (ref 8–23)
CO2: 23 mmol/L (ref 22–32)
Calcium: 9.1 mg/dL (ref 8.9–10.3)
Chloride: 103 mmol/L (ref 98–111)
Creatinine, Ser: 0.87 mg/dL (ref 0.44–1.00)
GFR calc Af Amer: >60 mL/min (ref >60)
GFR calc non Af Amer: >60 mL/min (ref >60)
Glucose, Bld: 103 mg/dL — ABNORMAL HIGH (ref 70–99)
Potassium: 3.8 mmol/L (ref 3.5–5.1)
Sodium: 138 mmol/L (ref 135–145)

## 2020-03-02 LAB — CBC WITH DIFFERENTIAL/PLATELET
Abs Immature Granulocytes: 0.02 10*3/uL (ref 0.00–0.07)
Basophils Absolute: 0.1 10*3/uL (ref 0.0–0.1)
Basophils Relative: 1 %
Eosinophils Absolute: 0.2 10*3/uL (ref 0.0–0.5)
Eosinophils Relative: 2 %
HCT: 38.7 % (ref 36.0–46.0)
Hemoglobin: 12.9 g/dL (ref 12.0–15.0)
Immature Granulocytes: 0 %
Lymphocytes Relative: 35 %
Lymphs Abs: 2.6 10*3/uL (ref 0.7–4.0)
MCH: 29.9 pg (ref 26.0–34.0)
MCHC: 33.3 g/dL (ref 30.0–36.0)
MCV: 89.6 fL (ref 80.0–100.0)
Monocytes Absolute: 0.5 10*3/uL (ref 0.1–1.0)
Monocytes Relative: 6 %
Neutro Abs: 4.1 10*3/uL (ref 1.7–7.7)
Neutrophils Relative %: 56 %
Platelets: 333 10*3/uL (ref 150–400)
RBC: 4.32 MIL/uL (ref 3.87–5.11)
RDW: 13.5 % (ref 11.5–15.5)
WBC: 7.4 10*3/uL (ref 4.0–10.5)
nRBC: 0 % (ref 0.0–0.2)

## 2020-03-02 LAB — TROPONIN I (HIGH SENSITIVITY)
Troponin I (High Sensitivity): 2 ng/L (ref <18)
Troponin I (High Sensitivity): 3 ng/L (ref <18)

## 2020-03-02 LAB — CBG MONITORING, ED: Glucose-Capillary: 105 mg/dL — ABNORMAL HIGH (ref 70–99)

## 2020-03-02 MED ORDER — HYDROMORPHONE HCL 1 MG/ML IJ SOLN
0.5000 mg | Freq: Once | INTRAMUSCULAR | Status: AC
  Administered 2020-03-02: 0.5 mg via INTRAVENOUS
  Filled 2020-03-02: qty 1

## 2020-03-02 MED ORDER — LIDOCAINE VISCOUS HCL 2 % MT SOLN
15.0000 mL | Freq: Once | OROMUCOSAL | Status: AC
  Administered 2020-03-02: 15 mL via ORAL
  Filled 2020-03-02: qty 15

## 2020-03-02 MED ORDER — ALUM & MAG HYDROXIDE-SIMETH 200-200-20 MG/5ML PO SUSP
30.0000 mL | Freq: Once | ORAL | Status: AC
  Administered 2020-03-02: 30 mL via ORAL
  Filled 2020-03-02: qty 30

## 2020-03-02 MED ORDER — KETOROLAC TROMETHAMINE 30 MG/ML IJ SOLN
15.0000 mg | Freq: Once | INTRAMUSCULAR | Status: AC
  Administered 2020-03-02: 15 mg via INTRAVENOUS
  Filled 2020-03-02: qty 1

## 2020-03-02 NOTE — ED Notes (Signed)
BS 105 

## 2020-03-02 NOTE — ED Triage Notes (Signed)
REMS brought pt in from [facility] urgent care with chest pain and back pain. Several weeks having tightness issues. States her family a DM .

## 2020-03-04 ENCOUNTER — Ambulatory Visit (INDEPENDENT_AMBULATORY_CARE_PROVIDER_SITE_OTHER): Payer: Medicare Other | Admitting: Licensed Clinical Social Worker

## 2020-03-04 DIAGNOSIS — F4312 Post-traumatic stress disorder, chronic: Secondary | ICD-10-CM | POA: Diagnosis not present

## 2020-03-04 DIAGNOSIS — F329 Major depressive disorder, single episode, unspecified: Secondary | ICD-10-CM

## 2020-03-04 NOTE — Progress Notes (Signed)
Virtual Visit via Telephone Note  Therapist-office Patient-home I connected with Curlene Tran on 03/04/20 at  1:00 PM EDT by telephone and verified that I am speaking with the correct person using two identifiers.   I discussed the limitations, risks, security and privacy concerns of performing an evaluation and management service by telephone and the availability of in person appointments. I also discussed with the patient that there may be a patient responsible charge related to this service. The patient expressed understanding and agreed to proceed.   I discussed the assessment and treatment plan with the patient. The patient was provided an opportunity to ask questions and all were answered. The patient agreed with the plan and demonstrated an understanding of the instructions.   The patient was advised to call back or seek an in-person evaluation if the symptoms worsen or if the condition fails to improve as anticipated.  I provided 52 minutes of non-face-to-face time during this encounter.   THERAPIST PROGRESS NOTE  Session Time: 1:00 PM to 1:52 PM  Participation Level: Active  Behavioral Response: CasualAlertAnxious and Dysphoric  Type of Therapy: Individual Therapy  Treatment Goals addressed:  decrease depression, trauma, coping, stress management particularly medical issues as they play a significant part in depression and anxiety  Interventions: Solution Focused, Strength-based, Supportive and Other: coping  Summary: Carla Tran is a 69 y.o. female who presents with thought had a heart attack, has irregular heart beat. Outcome was that determined indecisive as to cause. Bruises and needing to further look into it. MRI didn't come out good for back. Doctor is Dr. Leontine Locket. Recommendation to see a orthopedic surgeon. Bulging disc 4-5 collapsed titanium plate from past surgery in 2000. Lower lumbar problems. Went back to talking about recent incident and gets clammy and then  cold like the flu, seeing black spots Sending medicine. Took 8 tubes of blood. Sending information to PCP. Sore hardly walk when "everyone is yanking me". Chest hurting and couldn't breath. Feels pulling on spine, back hurts during this situation as well. Asking her to calm down and already has high anxiety. Non specific chest pain adult. Still swollen. IBS. Having headaches thought with allergies so bad. Has appointments to check in with skin doctor about her skin cancer, needs to see orthopedic doctor for knee. This doctor is Dr. Maureen Ralphs. Having more trouble with sleeping doctor and doctor told her that change habits as you get older. Wants something else to sleep. Also has problem with pain. Stresses about the appointments she hasn't been able to make including examination where had internal cancer, mammogram hasn't had it. Wants to see if can get gel in knee tendons in knee. Knee hurts. Can't open anything. Thinking about something that could hold her up on right hand side to catch her in case falls. Shared had a leg injury years ago. Injury when young. Also bone degenerative disease, osteoarthritis. Worries about family. Carla Tran is bold, lives on edge. Her grand daughters include Carla Tran 30 y.o Carla Tran 68 y.o. Carla Tran's son Carla Tran, her son's child.  Therapist pointed out family is probably worried was what happened patient says kid's are calling, friend is calling. Reviewed upcoming appts that are priority orthopedic for back and knee, 28th address skin cancer issues. Worry especially about daughter. Has temper. Worried about grandchildren and children.  Patient reviewing story about son and will continue for next session expressing her concerns worries about him in particular             Suicidal/Homicidal:  No  Therapist Response: Therapist reviewed symptoms, facilitated expression of thoughts and feelings, therapist provided active listening, open questions supportive interventions.  Process feelings  specifically related to stressors of both family and medical as way to help cope with stressors. Therapist provided patient with ongoing emotional support and encouragement.  Normalized her feelings.  Reinforced the importance of client staying focused on her own strengths and resources and resiliency. Processed various strategies for dealing with stressors, engage in problem solving related to medical stressors including prioritizing her needs and focus.      Plan: Return again in 4-5 weeks.2.Therapist provided supportive interventions, help patient in developing strategy to help her address medical issues, stress management  Diagnosis: Axis I:   Major depressive Disorder, recurrent, chronic, PTSD chronic    Axis II: No diagnosis    Carla Register, LCSW 03/04/2020

## 2020-03-07 NOTE — ED Provider Notes (Signed)
[facility] Provider Note   CSN: [id number] Arrival date & time: 03/02/20  1434     History Chief Complaint  Patient presents with   Chest Pain    Carla Tran is a 69 y.o. female.  HPI   69 year old female with chest pain.  Onset earlier today.  Pain is in the epigastrium going up into the substernal area.  Associated with some chest tightness/fullness.  She initially went to urgent care and then was advised to go to the emergency room.  She took aspirin and nitroglycerin prior to transfer.  She doesn't feel like symptoms are sniffily different.  No fevers or chills.  No cough.  No unusual leg pain or swelling.  Past Medical History:  Diagnosis Date   Allergy    Anxiety    Bursitis    shoulder   Cataract    forming    Cervical cancer (HCC)    Depression    DJD (degenerative joint disease)    Endometriosis    Fibromyalgia    Glaucoma    Head ache    Irregular heart beat    PTSD (post-traumatic stress disorder)    PTSD (post-traumatic stress disorder)    Seizures (HCC)    x 1 only- in early 2000's and none since    Skin cancer    Stroke [facility])     Patient Active Problem List   Diagnosis Date Noted   Altered mental status 07/23/2019   Cervical post-laminectomy syndrome 07/23/2019   Cervical spondylosis 07/23/2019   Lumbar facet joint syndrome 07/23/2019   Lumbosacral spondylosis without myelopathy 07/23/2019   Myofascial pain 07/23/2019   Neck pain 07/23/2019   Osteoarthritis of hip 07/23/2019   Unspecified inflammatory spondylopathy, cervical region ([place]) 07/23/2019   Pain of right sacroiliac joint 10/07/2018   Anxiety 05/01/2017   Sleep walking 12/23/2014   Chronic neck and back pain 05/17/2014   Chronic recurrent major depressive disorder ([place]) 05/17/2014   DDD (degenerative disc disease), cervical 05/17/2014   Lumbar disc disease with radiculopathy 05/17/2014   PTSD (post-traumatic stress disorder)  05/17/2014   DIARRHEA 11/17/2009    Past Surgical History:  Procedure Laterality Date   BACK SURGERY     CHOLECYSTECTOMY     COLONOSCOPY     ligaments removed from elbow     PARTIAL HYSTERECTOMY     skin cancer removed     from face x 2, abdomen and arm left    TUBAL LIGATION       OB History    Gravida      Para      Term      Preterm      AB      Living  2     SAB      TAB      Ectopic      Multiple      Live Births              Family History  Problem Relation Age of Onset   Diabetes Mother    Prostate cancer Father    Breast cancer Maternal Aunt    Diabetes Sister    Esophageal cancer Sister    Colon cancer Neg Hx    Rectal cancer Neg Hx    Stomach cancer Neg Hx    Colon polyps Neg Hx     Social History   Tobacco Use   Smoking status: Never Smoker   Smokeless tobacco: Never Used  Vaping Use   Vaping Use: Never used  Substance Use Topics   Alcohol use: No    Alcohol/week: 0.0 standard drinks   Drug use: Yes    Frequency: 1.0 times per week    Types: Marijuana    Home Medications Prior to Admission medications   Medication Sig Start Date End Date Taking? Authorizing Provider  ALPRAZolam Duanne Moron) 0.5 MG tablet Take 0.5 mg by mouth in the morning, at noon, in the evening, and at bedtime.    Yes [provider]  cyclobenzaprine (FLEXERIL) 10 MG tablet Take 30 mg by mouth at bedtime.  03/25/19  Yes [provider]  dicyclomine (BENTYL) 10 MG capsule Take 10 mg by mouth 4 (four) times daily. 02/24/20  Yes [provider]  DULoxetine (CYMBALTA) 60 MG capsule 2  qam Patient taking differently: Take 120 mg by mouth in the morning.  12/31/19  Yes [name], Berneta Sages, MD  fluorouracil (EFUDEX) 5 % cream Apply 1 application topically daily. Apply sparingly to left side of face 02/10/20  Yes [provider]  gabapentin (NEURONTIN) 300 MG capsule Take 300 mg by mouth 2 (two) times daily. 04/04/14   Yes [provider]  Multiple Vitamins-Minerals (MULTIVITAMIN WITH MINERALS) tablet Take 1 tablet by mouth daily.   Yes [provider]  mupirocin ointment (BACTROBAN) 2 % Apply 1 application topically 2 (two) times daily. On left thigh 02/19/20  Yes [provider]  Omega-3 Fatty Acids (FISH OIL) 1000 MG CAPS Take 1 g by mouth daily.   Yes [provider]  PATADAY 0.2 % SOLN Place 1 drop into both eyes daily. 04/04/14  Yes [provider]    Allergies    Penicillins, Haemophilus influenzae vaccines, Mirtazapine, Darvon [propoxyphene], Sulfonamide derivatives, Codeine, and Ivp dye [iodinated diagnostic agents]  Review of Systems   Review of Systems All systems reviewed and negative, other than as noted in HPI.  Physical Exam Updated Vital Signs BP 123/89    Pulse 83    Temp 97.6 F (36.4 C) (Oral)    Resp 12    Ht 5\' 2"  (1.575 m)    Wt 72.4 kg    SpO2 99%    BMI 29.21 kg/m   Physical Exam Vitals and nursing note reviewed.  Constitutional:      General: She is not in acute distress.    Appearance: She is well-developed.  HENT:     Head: Normocephalic and atraumatic.  Eyes:     General:        Right eye: No discharge.        Left eye: No discharge.     Conjunctiva/sclera: Conjunctivae normal.  Cardiovascular:     Rate and Rhythm: Normal rate and regular rhythm.     Heart sounds: Normal heart sounds. No murmur heard.  No friction rub. No gallop.   Pulmonary:     Effort: Pulmonary effort is normal. No respiratory distress.     Breath sounds: Normal breath sounds.  Abdominal:     General: There is no distension.     Palpations: Abdomen is soft.     Tenderness: There is abdominal tenderness.     Comments: Mild tenderness to palpation epigastric without rebound or guarding.  No distention.  Musculoskeletal:        General: No tenderness.     Cervical back: Neck supple.     Comments: Lower extremities symmetric as compared to each  other. No calf tenderness. Negative Homan's. No palpable cords.  Skin:    General: Skin is warm and dry.  Neurological:     Mental Status: She is alert.  Psychiatric:        Behavior: Behavior normal.        Thought Content: Thought content normal.     ED Results / Procedures / Treatments   Labs (all labs ordered are listed, but only abnormal results are displayed) Labs Reviewed  BASIC METABOLIC PANEL - Abnormal; Notable for the following components:      Result Value   Glucose, Bld 103 (*)    All other components within normal limits  CBG MONITORING, ED - Abnormal; Notable for the following components:   Glucose-Capillary 105 (*)    All other components within normal limits  CBC WITH DIFFERENTIAL/PLATELET  TROPONIN I (HIGH SENSITIVITY)  TROPONIN I (HIGH SENSITIVITY)    EKG EKG Interpretation  Date/Time:  Tuesday March 02 2020 14:44:52 EDT Ventricular Rate:  97 PR Interval:    QRS Duration: 88 QT Interval:  432 QTC Calculation: 552 R Axis:   58 Text Interpretation: suspect sinus rhythm Interpretation limited secondary to artifact Confirmed by Virgel Manifold [phone]) on 03/02/2020 3:29:05 PM   Radiology No results found.  Procedures Procedures (including critical care time)  Medications Ordered in ED Medications  ketorolac (TORADOL) 30 MG/ML injection 15 mg (15 mg Intravenous Given 03/02/20 1559)  HYDROmorphone (DILAUDID) injection 0.5 mg (0.5 mg Intravenous Given 03/02/20 1559)  alum & mag hydroxide-simeth (MAALOX/MYLANTA) 200-200-20 MG/5ML suspension 30 mL (30 mLs Oral Given 03/02/20 1852)    And  lidocaine (XYLOCAINE) 2 % viscous mouth solution 15 mL (15 mLs Oral Given 03/02/20 1852)  HYDROmorphone (DILAUDID) injection 0.5 mg (0.5 mg Intravenous Given 03/02/20 1852)    ED Course  I have reviewed the triage vital signs and the nursing notes.  Pertinent labs & imaging results that were available during my care of the patient were reviewed by me and considered in  my medical decision making (see chart for details).    MDM Rules/Calculators/A&P                         69 year old female with chest pain.  Doubt ACS.  Atypical for PE, dissection.  Final Clinical Impression(s) / ED Diagnoses Final diagnoses:  Chest pain, unspecified type    Rx / DC Orders ED Discharge Orders    None       Virgel Manifold, MD 03/07/20 2116

## 2020-04-15 ENCOUNTER — Ambulatory Visit (INDEPENDENT_AMBULATORY_CARE_PROVIDER_SITE_OTHER): Payer: Medicare Other | Admitting: Licensed Clinical Social Worker

## 2020-04-15 DIAGNOSIS — F4312 Post-traumatic stress disorder, chronic: Secondary | ICD-10-CM

## 2020-04-15 DIAGNOSIS — F329 Major depressive disorder, single episode, unspecified: Secondary | ICD-10-CM | POA: Diagnosis not present

## 2020-04-15 NOTE — Progress Notes (Signed)
Virtual Visit via Telephone Note  Therapist-home office Patient-home I connected with Carla Tran on 04/15/20 at  1:00 PM EDT by telephone and verified that I am speaking with the correct person using two identifiers.   I discussed the limitations, risks, security and privacy concerns of performing an evaluation and management service by telephone and the availability of in person appointments. I also discussed with the patient that there may be a patient responsible charge related to this service. The patient expressed understanding and agreed to proceed.   I discussed the assessment and treatment plan with the patient. The patient was provided an opportunity to ask questions and all were answered. The patient agreed with the plan and demonstrated an understanding of the instructions.   The patient was advised to call back or seek an in-person evaluation if the symptoms worsen or if the condition fails to improve as anticipated.  I provided 55 minutes of non-face-to-face time during this encounter.  THERAPIST PROGRESS NOTE  Session Time: 1:00 PM to 1:55 PM   Participation Level: Active  Behavioral Response: CasualAlertAnxious  Type of Therapy: Individual Therapy  Treatment Goals addressed:  decrease depression, trauma, coping, stress management particularly medical issues as they play a significant part in depression and anxiety  Interventions: Solution Focused, Strength-based, Supportive and Other: coping  Summary: Carla Tran is a 69 y.o. female who presents with bad month, went to urgent care and hospital, got shots needs a break from going to so many doctors. Reviewed from what she said last session they decided indecisive so don't know what was wrong with headache and chest pains. Headaches makes her sick and scaring her because has had a mini strokes. Next week skin surgery see if one on face is clear. Orthopedic appointment next week. Saw spine doctor said got feedback that  wasn't working right so has a brace. Goes 15th for knee and it is described to her as "bone to bone" for shots. 3 shots. Will get injections. Other stuff haven't even started. Mammogram, pelvic exam, thinks maybe should go to ear nose and throat specialist and find out what is going on in head. In dark room, panic attacks because scaring her to death. PCP's office worker told her to go to urgent care. Dr. Derrek Tran is PCP won't be able to see until October. Feels the "same thing over and over and tired and can't sleep". Can't wear dentures gum swollen. Doctor did switch her Allegra and helping with headache. Now needs to be careful about knee and back now. Don't know about head. Doctor will see in October. See if should see a specialist. Does have irregular heart beat, takes deep breaths, panic coming so quick don't have time to do anything. Five appointments this month. For knees, for skin cancer. Dr. Marcello Tran thinks can address and things will be fixed in her hip so may not need knee surgery. Patient able to view certain situations that happened with humor and then said "laugh so cry". Discussed her records being sent for therapist to learn about her past. Was told to tell her children and does not want children to now so embarrassing.  Therapist encourage patient to do but made her feel best and if she did not feel like telling him be the better choice for her. Cymbalta not helping. If needs to she is thinking of going to hospital in [address]. Still all over the place. On Xanax. Shared past treatment did dig deeper in childhood things didn't remember did help.  Reviewed coping strategies for anxiety by therapist. Patient says she has been breathing in bad and praying. Gets headache every day but not like when had to go to hospital. Takes Advil and Ibuprofen before gets worse. Has GI under control. Will have In home care 13 hours a week. Carla Tran her friend who is going to do it.  Patient describes people care  is very helpful and she cares Girlfriend in [place] her rock and helps her a lot. More of her family than her own family. Knows her family is bad news nothing to do with them. Her sister she found next door dead.  Therapist reviewed session pointing out importance of prioritizing medical issues and right now one of the [place] would be headaches and patient agrees.          Suicidal/Homicidal: No  Therapist Response: Therapist reviewed symptoms, facilitated expression of thoughts and feelings to help with coping and managing stressors.  Provided education on management of anxiety and panic attack that anxiety at its core is a false alarm misperceiving something is dangerous and de-escalating panic includes a combination of utilizing a relaxation strategy to calm the amygdala and also positive coping statements to the de-catastrophize.  Assessed patient not following education piece will have to reiterate with her, she talks rapidly interrupting therapist so assess not focus on information as much as she needs to.  Assessed patient does use her spirituality and deep breathing that helps her with panic.  Discussed importance of prioritizing medical issues she needs to address that also include her headaches and bided positive feedback the patient will get help if she needs it.  Additionally what really helps patient which includes this therapist or people who care so beneficial that she will be having somebody come for home care who cares about her.  Therapist discussed coming into office when she is able to work on trauma that different options are available narrating the trauma but also somatic interventions could be helpful.  Therapist provided active listening open questions supportive interventions.  Plan: Return again in 2 months weeks.(For some medication so needed to be scheduled out so did not conflict with other doctors appointments as well) 2.Therapist provided supportive interventions, help  patient in developing strategy to help her address medical issues, stress management  Diagnosis: Axis I:  Major depressive Disorder, recurrent, chronic, PTSD chronic    Axis II: No diagnosis    [name], LCSW 04/15/2020

## 2020-06-17 ENCOUNTER — Ambulatory Visit (INDEPENDENT_AMBULATORY_CARE_PROVIDER_SITE_OTHER): Payer: Medicare Other | Admitting: Licensed Clinical Social Worker

## 2020-06-17 DIAGNOSIS — F329 Major depressive disorder, single episode, unspecified: Secondary | ICD-10-CM | POA: Diagnosis not present

## 2020-06-17 DIAGNOSIS — F4312 Post-traumatic stress disorder, chronic: Secondary | ICD-10-CM | POA: Diagnosis not present

## 2020-06-17 NOTE — Progress Notes (Signed)
Virtual Visit via Telephone Note  I connected with Carla Tran on 06/17/20 at  1:00 PM EDT by telephone and verified that I am speaking with the correct person using two identifiers.  Location: Patient: home Provider: home office   I discussed the limitations, risks, security and privacy concerns of performing an evaluation and management service by telephone and the availability of in person appointments. I also discussed with the patient that there may be a patient responsible charge related to this service. The patient expressed understanding and agreed to proceed.   I discussed the assessment and treatment plan with the patient. The patient was provided an opportunity to ask questions and all were answered. The patient agreed with the plan and demonstrated an understanding of the instructions.   The patient was advised to call back or seek an in-person evaluation if the symptoms worsen or if the condition fails to improve as anticipated.  I provided 52 minutes of non-face-to-face time during this encounter.  THERAPIST PROGRESS NOTE  Session Time: 1:00 PM to 1:56 PM  Participation Level: Active  Behavioral Response: CasualAlertDepressed/scattered/hyper  Type of Therapy: Individual Therapy  Treatment Goals addressed:  decrease depression, trauma, coping, stress management particularly medical issues as they play a significant part in depression and anxiety Interventions: CBT, Solution Focused, Strength-based, Supportive and Other: coping  Summary: Carla Tran is a 69 y.o. female who presents with recent medical issues include referred to a specialist for allergies. Patient highlighted a positive that her knee not hurting constantly finally and done with shots. Put a gel in three different times. Upset that she has gained so much weight. Reviewed medications and doesn't think on any that she could stopped, needs all of them.  Can't do much because limited because of back. Don't  want to go out, depressed, stay in dark rooms, don't want to be around people. More anxious, more anxiety attacks. Worried about granddaughter. Headaches so severe come on suddenly. Has to make herself do something. Doesn't want to do something and all wants to do sleep. Positive that doctor has set up so she can get help 13 hours a week. (Can't lift arms) She knows the person, [name]. Thinks this will really help, feels better when she is around and patient says she is a good person. Patient recognizes in talking with therapist can't stay on one subject. Discussed could write a best seller awful what she went through. Wonders if she tell her children about her life. Tell them addiction, parts of her life and how bad it could get. 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 Spirituality has helped. Realizes that 90% of time she is alone and all she does is think. Knows conductive to negative thoughts nothing disrupting thoughts. Worried about children. COVID is not helping. Economic factors also is a factor. Family is far and can't afford to pay gas to see family. Seeing young granddaughter has helped come over one night last three weeks. Fighting with granddaughter about giving her money for apartment and patient won't do it. Therapist pointed out this is is another reason for depression. Realizes some of it age.  Reviewed session and therapist reviewed CBT strategies of depression.  Patient wants to send records to therapist from previous psychiatrist and therapist although was quite a while ago.  Therapist was able to provide number for her office to see if she can get records sent   Suicidal/Homicidal: No  Therapist Response: Therapist reviewed symptoms, facilitated expression of thoughts and feelings, used CBT strategies  to help patient with depression identifying how negative thoughts as well as being more active helpful for depression.  Identified negative thoughts such as permanence that things were always be bad that  everything is bad as distortions as it is all or nothing thinking.  Discussed that disrupting negative thinking helpful for mood including distraction, reframing with positive.  Also wanted to encourage patient to get out of her head shift gears to another modality of experience, 1 of those being spending time with her granddaughter and [name] and interpersonal communication.  Explored causal reasons for depression is understanding source could help with developing coping strategies.  Therapist pointed out medical issues that have isolated her as snowballed worsening depression, fighting with granddaughter, her isolation leading to negative thinking.  Identified issue for patient is attention issues hyperactivity and hopes to address this with psychiatrist.  Therapist provided open questions, active listening supportive interventions.  Plan: Return again in 4-5 weeks.2.  Therapist work with patient on depression, anxiety, trauma  Diagnosis: Axis I:  Major depressive Disorder, recurrent, chronic, PTSD chronic    Axis II: No diagnosis    [name], LCSW 06/17/2020

## 2020-06-30 ENCOUNTER — Telehealth (INDEPENDENT_AMBULATORY_CARE_PROVIDER_SITE_OTHER): Payer: Medicare Other | Admitting: Psychiatry

## 2020-06-30 ENCOUNTER — Other Ambulatory Visit: Payer: Self-pay

## 2020-06-30 DIAGNOSIS — F324 Major depressive disorder, single episode, in partial remission: Secondary | ICD-10-CM

## 2020-06-30 MED ORDER — ATOMOXETINE HCL 40 MG PO CAPS: ORAL_CAPSULE | ORAL | 3 refills | Status: DC

## 2020-06-30 MED ORDER — DULOXETINE HCL 60 MG PO CPEP: 120.0000 mg | ORAL_CAPSULE | Freq: Every morning | ORAL | 3 refills | Status: DC

## 2020-06-30 MED ORDER — ATOMOXETINE HCL 40 MG PO CAPS
40.0000 mg | ORAL_CAPSULE | Freq: Every day | ORAL | 2 refills | Status: DC
Start: 2020-06-30 — End: 2020-06-30

## 2020-06-30 NOTE — Progress Notes (Addendum)
Psychiatric Initial Adult Assessment   Patient Identification: Carla Tran MRN:  [id number] Date of Evaluation:  06/30/2020 Referral Source: Dr. Shawn Stall Chief Complaint:   Visit Diagnosis: Major depression chronic  History of Present Illness: Today the patient is doing only fairly well.  Once again she is very stressed out by multiple medical illnesses.  She says they do not have a clear explanation for why she is feeling so bad.  Today she thinks that she needs to be on medicines for attention deficit disorder.  She is very disruptive and dysfunctional childhood.  Her parents were alcohol dependent and kept her out of school so that she did not do well.  She went to 10th grade and dropped out eventually got a GED.  She says that 10 or 20 years ago her psychiatrist gave her a stimulant.  I am not clear of the benefit.  I am not clear if she truly had childhood ADHD.  The patient has been on Cymbalta 120 mg both for depression and for pain.  It is very difficult to evaluate this patient.  Generally she is sleeping and she is maintaining her weight.  She is not psychotic.  She denies use of alcohol or drugs.  The patient says people do not understand her because she talks in circles.  For me she talks quickly and she seems to have a speech impediment.  This may be related to dental issues.  Nonetheless it is very clear to me that she is still emotionally very distressed.  It is also clear to me that I need to see her in person. Virtual Visit via Telephone Note  I connected with Carla Tran on 06/06/23 at  1:00 PM EST by telephone and verified that I am speaking with the correct person using two identifiers.  Location: Patient: home Provider: in patient   I discussed the limitations, risks, security and privacy concerns of performing an evaluation and management service by telephone and the availability of in person appointments. I also discussed with the patient that there may be a patient  responsible charge related to this service. The patient expressed understanding and agreed to proceed.     I discussed the assessment and treatment plan with the patient. The patient was provided an opportunity to ask questions and all were answered. The patient agreed with the plan and demonstrated an understanding of the instructions.   The patient was advised to call back or seek an in-person evaluation if the symptoms worsen or if the condition fails to improve as anticipated.  I provided 30 minutes of non-face-to-face time during this encounter.   [name], MD   Depression Symptoms:  disturbed sleep, (Hypo) Manic Symptoms:   Anxiety Symptoms:   Psychotic Symptoms:   PTSD Symptoms:   Past Psychiatric History: Under the care of Dr. Yetta Barre and hospitalized for alcohol treatment  Previous Psychotropic Medications: Xanax 0.5 mg 4 times daily, Celexa 40 mg, Seroquel unclear dose  Substance Abuse History in the last 12 months:  Yes.    Consequences of Substance Abuse:   Past Medical History:  Past Medical History:  Diagnosis Date   Allergy    Anxiety    Bursitis    shoulder   Cataract    forming    Cervical cancer (HCC)    Depression    DJD (degenerative joint disease)    Endometriosis    Fibromyalgia    Glaucoma    Head ache    Irregular heart beat  PTSD (post-traumatic stress disorder)    PTSD (post-traumatic stress disorder)    Seizures (HCC)    x 1 only- in early 2000's and none since    Skin cancer    Stroke [facility])     Past Surgical History:  Procedure Laterality Date   BACK SURGERY     CHOLECYSTECTOMY     COLONOSCOPY     ligaments removed from elbow     PARTIAL HYSTERECTOMY     skin cancer removed     from face x 2, abdomen and arm left    TUBAL LIGATION      Family Psychiatric History:   Family History:  Family History  Problem Relation Age of Onset   Diabetes Mother    Prostate cancer Father    Breast cancer Maternal Aunt     Diabetes Sister    Esophageal cancer Sister    Colon cancer Neg Hx    Rectal cancer Neg Hx    Stomach cancer Neg Hx    Colon polyps Neg Hx     Social History:   Social History   Socioeconomic History   Marital status: Divorced    Spouse name: Not on file   Number of children: 2   Years of education: Not on file   Highest education level: Not on file  Occupational History   Occupation: disabled  Tobacco Use   Smoking status: Never Smoker   Smokeless tobacco: Never Used  Building services engineer Use: Never used  Substance and Sexual Activity   Alcohol use: No    Alcohol/week: 0.0 standard drinks   Drug use: Yes    Frequency: 1.0 times per week    Types: Marijuana   Sexual activity: Not on file  Other Topics Concern   Not on file  Social History Narrative   Not on file   Social Determinants of Health   Financial Resource Strain:    Difficulty of Paying Living Expenses: Not on file  Food Insecurity:    Worried About Programme researcher, broadcasting/film/video in the Last Year: Not on file   [employer] of Food in the Last Year: Not on file  Transportation Needs:    Lack of Transportation (Medical): Not on file   Lack of Transportation (Non-Medical): Not on file  Physical Activity:    Days of Exercise per Week: Not on file   Minutes of Exercise per Session: Not on file  Stress:    Feeling of Stress : Not on file  Social Connections:    Frequency of Communication with Friends and Family: Not on file   Frequency of Social Gatherings with Friends and Family: Not on file   Attends Religious Services: Not on file   Active Member of Clubs or Organizations: Not on file   Attends Banker Meetings: Not on file   Marital Status: Not on file    Additional Social History:   Allergies:   Allergies  Allergen Reactions   Penicillins Anaphylaxis   Haemophilus Influenzae Vaccines Rash   Mirtazapine Other (See Comments)    Pt states this medication made her sleep walk.   Darvon  [Propoxyphene] Other (See Comments)   Sulfonamide Derivatives Other (See Comments)    Stomach Spasms    Codeine Itching and Rash   Ivp Dye [Iodinated Diagnostic Agents] Rash    Metabolic Disorder Labs: No results found for: HGBA1C, MPG No results found for: PROLACTIN No results found for: CHOL, TRIG, HDL, CHOLHDL, VLDL, LDLCALC  Lab Results  Component Value Date   TSH 0.81 11/17/2009    Therapeutic Level Labs: No results found for: LITHIUM No results found for: CBMZ No results found for: VALPROATE  Current Medications: Current Outpatient Medications  Medication Sig Dispense Refill   ALPRAZolam (XANAX) 0.5 MG tablet Take 0.5 mg by mouth in the morning, at noon, in the evening, and at bedtime.      atomoxetine (STRATTERA) 40 MG capsule 1  qam 30 capsule 3   cyclobenzaprine (FLEXERIL) 10 MG tablet Take 30 mg by mouth at bedtime.      dicyclomine (BENTYL) 10 MG capsule Take 10 mg by mouth 4 (four) times daily.     DULoxetine (CYMBALTA) 60 MG capsule Take 2 capsules (120 mg total) by mouth in the morning. 120 capsule 3   fluorouracil (EFUDEX) 5 % cream Apply 1 application topically daily. Apply sparingly to left side of face     gabapentin (NEURONTIN) 300 MG capsule Take 300 mg by mouth 2 (two) times daily.     Multiple Vitamins-Minerals (MULTIVITAMIN WITH MINERALS) tablet Take 1 tablet by mouth daily.     mupirocin ointment (BACTROBAN) 2 % Apply 1 application topically 2 (two) times daily. On left thigh     Omega-3 Fatty Acids (FISH OIL) 1000 MG CAPS Take 1 g by mouth daily.     PATADAY 0.2 % SOLN Place 1 drop into both eyes daily.     No current facility-administered medications for this visit.    Musculoskeletal: Strength & Muscle Tone: Normal gait & Station: normal Patient leans: N/A  Psychiatric Specialty Exam: ROS  There were no vitals taken for this visit.There is no height or weight on file to calculate BMI.  General Appearance: Casual  Eye Contact:  Good  Speech:   Clear and Coherent  Volume:  Normal  Mood:  Negative  Affect:  Congruent  Thought Process:  Goal Directed  Orientation:  Full (Time, Place, and Person)  Thought Content:  WDL  Suicidal Thoughts:  No  Homicidal Thoughts:  No  Memory:  Negative  Judgement:  Fair  Insight:  Fair  Psychomotor Activity:  Normal  Concentration:    Recall:  Poor  Fund of Knowledge:Poor  Language: Fair  Akathisia:  No  Handed:  Right  AIMS (if indicated):  not done  Assets:  Desire for Improvement  ADL's:    Cognition: Impaired,  Mild  Sleep:  Fair   Screenings:   Assessment and Plan: 11/17/20211:42 PM  This patient's first problem is that of major depression.  We will review her history but for now she will continue taking Cymbalta 120 mg.  It is noted that she takes a moderate dose of Xanax from her primary care doctor.  She also takes gabapentin.  At this time we will go ahead and add Strattera 40 mg.  Her avoid giving her any controlled stimulants.  It is my hope that Strattera helps both her mood and her ability to focus and concentrate.  Patient is not suicidal.  She lives alone and seems to function fairly well.  This patient will be seen again in 2 to 3 months.

## 2020-07-22 ENCOUNTER — Ambulatory Visit (INDEPENDENT_AMBULATORY_CARE_PROVIDER_SITE_OTHER): Payer: Medicare Other | Admitting: Licensed Clinical Social Worker

## 2020-07-22 DIAGNOSIS — F4312 Post-traumatic stress disorder, chronic: Secondary | ICD-10-CM

## 2020-07-22 DIAGNOSIS — F324 Major depressive disorder, single episode, in partial remission: Secondary | ICD-10-CM | POA: Insufficient documentation

## 2020-07-22 DIAGNOSIS — F329 Major depressive disorder, single episode, unspecified: Secondary | ICD-10-CM | POA: Diagnosis not present

## 2020-07-22 NOTE — Progress Notes (Signed)
Virtual Visit via Telephone Note  I connected with Carla Tran on 07/22/20 at  1:00 PM EST by telephone and verified that I am speaking with the correct person using two identifiers.  Location: Patient: home  Provider: home office   I discussed the limitations, risks, security and privacy concerns of performing an evaluation and management service by telephone and the availability of in person appointments. I also discussed with the patient that there may be a patient responsible charge related to this service. The patient expressed understanding and agreed to proceed.  I discussed the assessment and treatment plan with the patient. The patient was provided an opportunity to ask questions and all were answered. The patient agreed with the plan and demonstrated an understanding of the instructions.   The patient was advised to call back or seek an in-person evaluation if the symptoms worsen or if the condition fails to improve as anticipated.  I provided 52 minutes of non-face-to-face time during this encounter.  THERAPIST PROGRESS NOTE  Session Time: 1:00 Pm to 1:52 PM  Participation Level: Active  Behavioral Response: CasualAlertEuthymic  Type of Therapy: Individual Therapy  Treatment Goals addressed:  ecrease depression, trauma, coping, stress management particularly medical issues as they play a significant part in depression and anxiety Interventions: Motivational Interviewing, Strength-based, Supportive and Other: coping  Summary: Carla Tran is a 69 y.o. female who presents with blood work negative is good news. Want to look down throat because of issues with throat, will see a ear nose, throat doctor. Has been getting worse and worse, it is so dry all the time. When it gets bad can't talk maybe something going on with vocal cords. 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 Bothers her doesn't know what it is.  Expresses her fear mom died from issues with her throat and  therapist noted difference mom was an alcoholic one of the consequences can be issues such as esophageal issues.  Regular checks for skin because of skin cancer. Has been going frequently to cancer, eye and stomach doctor so hadn't had time for physical. Told her bladder will need tack back, will be addressed with Carla Tran, gynecologist and goes to [facility] care in [place]. Eye glasses didn't give her the right prescription, left eye remains blurry. Psychiatrist ordered medicine for ADHD. Shares still issues with attention, If don't finish sentence will forget but people note improvement with new medication added.. All and all good. Shot for back, gel for knee, and now all has are throat and eye issues. PCP fixed stomach issues, new medication works. Feels medication Dr. Casimiro Needle giving working. Aid helps. Most of Christmas finished. Therapist pointed out in a good mood for this session and patient probably because having a good day, yesterday different, turned phone off and upset because she always pays her bills. Stress in relationship with sister Carla Tran talked about with therapist. Talked about granddaughters. Younger one comes over to patient's, Carla Tran. There is Carla Tran, grandson. Carla Tran-19 mad at her for not in her more money other patient helped her with her car aching loan,       Suicidal/Homicidal: No  Therapist Response: Therapist reviewed symptoms, facilitated expression of thoughts and feelings.  Difficult at times to keep track of conversation, nervous impression patient was hyper at times but also pointed out to patient therapist impression she was in a better mood, patient's positive report most of her medical issues have been addressed.  Therapist noted patient really taking a turn with report of  positive news with medical issues.  Still at times difficult to follow track of conversation.  Noted positive developments helpful for mood including having an aide who patient knows to come helps  with mood, patient able to be more mobile helpful so she can get out medical issues improving.  Therapist processed feelings around stressors and family relationships to help patient with coping.  Therapist provided active listening, open questions supportive interventions  Plan: Return again in 6 weeks.2.Therapist work with patient on depression, anxiety, trauma  Diagnosis: Axis I:  Major depressive Disorder, recurrent, chronic, PTSD chronic    Axis II: No diagnosis    [name], LCSW 07/22/2020

## 2020-09-01 ENCOUNTER — Ambulatory Visit (HOSPITAL_COMMUNITY): Payer: Medicare Other | Admitting: Licensed Clinical Social Worker

## 2020-09-01 DIAGNOSIS — F4312 Post-traumatic stress disorder, chronic: Secondary | ICD-10-CM

## 2020-09-01 DIAGNOSIS — F329 Major depressive disorder, single episode, unspecified: Secondary | ICD-10-CM

## 2020-09-01 NOTE — Progress Notes (Signed)
Summary: Carla Tran is a 70 y.o. female who presents with chills and hot and cold. Difficult for therapist to understand conversation. Patient reviewed what has been happening and therapist reviewed what she had said to make sure understood what patient had just related. Patient was talking about medical procedures that is wearing her out. Bad fall and knee hurt. Pills for ADHD and friend thinks helping but patient doesn't think so. Patient sick so she will call to reschedule.       [name], LCSW 09/01/2020

## 2020-09-15 ENCOUNTER — Other Ambulatory Visit: Payer: Self-pay

## 2020-09-15 ENCOUNTER — Telehealth (INDEPENDENT_AMBULATORY_CARE_PROVIDER_SITE_OTHER): Payer: Medicare Other | Admitting: Psychiatry

## 2020-09-15 DIAGNOSIS — F324 Major depressive disorder, single episode, in partial remission: Secondary | ICD-10-CM

## 2020-09-15 MED ORDER — ATOMOXETINE HCL 25 MG PO CAPS: ORAL_CAPSULE | ORAL | 5 refills | Status: DC

## 2020-09-15 MED ORDER — DULOXETINE HCL 60 MG PO CPEP: 120.0000 mg | ORAL_CAPSULE | Freq: Every morning | ORAL | 5 refills | Status: DC

## 2020-09-15 NOTE — Progress Notes (Signed)
Psychiatric Initial Adult Assessment   Patient Identification: Carla Tran MRN:  [id number] Date of Evaluation:  09/15/2020 Referral Source: [name] Chief Complaint:   Visit Diagnosis: Major depression chronic  History of Present Illness:  Today the patient seems to be at her baseline.  However she says that she is more anxious more jittery and more energized on the Strattera 40 mg.  The home care aide who comes into our home says she is different and tells her that she thinks the medicine is too strong.  Perhaps it is making her too hyper.  The patient is significant problem is that she keeps falling.  She fell and hit her knee.  Fortunately she did not break anything.  She has chronic pain in her knee and her back.  The patient received Xanax from her primary care doctor 0.5 4 times daily.  She takes Cymbalta for me on 120 mg.  Her appetite is good her energy level is good.  She denies use of alcohol or drugs.  She has no evidence of psychosis.  The patient speaks very rapidly and she is often hard to understand.  Patient lives alone.  She has been doing her bills up until recently when she allowed a friend to take over this.  Her home health care aide takes care of her watching over close and probably cleaning her house.  The patient says she does her own groceries and does her own cooking.  In essence she does all her basic ADLs and probably some of her institutional ADLs.  She has been driving up until recently but she is considering cutting back on this.  Her home health care aide takes 2 different places.  Today the patient did not bring up the issue of attention deficit disorder.  Depression Symptoms:  disturbed sleep, (Hypo) Manic Symptoms:   Anxiety Symptoms:   Psychotic Symptoms:   PTSD Symptoms:   Past Psychiatric History: Under the care of Dr. Ronnald Ramp and hospitalized for alcohol treatment  Previous Psychotropic Medications: Xanax 0.5 mg 4 times daily, Celexa 40 mg,  Seroquel unclear dose  Substance Abuse History in the last 12 months:  Yes.    Consequences of Substance Abuse:   Past Medical History:  Past Medical History:  Diagnosis Date  . Allergy   . Anxiety   . Bursitis    shoulder  . Cataract    forming   . Cervical cancer ([place])   . Depression   . DJD (degenerative joint disease)   . Endometriosis   . Fibromyalgia   . Glaucoma   . Head ache   . Irregular heart beat   . PTSD (post-traumatic stress disorder)   . PTSD (post-traumatic stress disorder)   . Seizures ([place])    x 1 only- in early 2000's and none since   . Skin cancer   . Stroke [facility])     Past Surgical History:  Procedure Laterality Date  . BACK SURGERY    . CHOLECYSTECTOMY    . COLONOSCOPY    . ligaments removed from elbow    . PARTIAL HYSTERECTOMY    . skin cancer removed     from face x 2, abdomen and arm left   . TUBAL LIGATION      Family Psychiatric History:   Family History:  Family History  Problem Relation Age of Onset  . Diabetes Mother   . Prostate cancer Father   . Breast cancer Maternal Aunt   . Diabetes Sister   .  Esophageal cancer Sister   . Colon cancer Neg Hx   . Rectal cancer Neg Hx   . Stomach cancer Neg Hx   . Colon polyps Neg Hx     Social History:   Social History   Socioeconomic History  . Marital status: Divorced    Spouse name: Not on file  . Number of children: 2  . Years of education: Not on file  . Highest education level: Not on file  Occupational History  . Occupation: disabled  Tobacco Use  . Smoking status: Never Smoker  . Smokeless tobacco: Never Used  Vaping Use  . Vaping Use: Never used  Substance and Sexual Activity  . Alcohol use: No    Alcohol/week: 0.0 standard drinks  . Drug use: Yes    Frequency: 1.0 times per week    Types: Marijuana  . Sexual activity: Not on file  Other Topics Concern  . Not on file  Social History Narrative  . Not on file   Social Determinants of Health   Financial  Resource Strain: Not on file  Food Insecurity: Not on file  Transportation Needs: Not on file  Physical Activity: Not on file  Stress: Not on file  Social Connections: Not on file    Additional Social History:   Allergies:   Allergies  Allergen Reactions  . Penicillins Anaphylaxis  . Haemophilus Influenzae Vaccines Rash  . Mirtazapine Other (See Comments)    Pt states this medication made her sleep walk.  . Darvon [Propoxyphene] Other (See Comments)  . Sulfonamide Derivatives Other (See Comments)    Stomach Spasms   . Codeine Itching and Rash  . Ivp Dye [Iodinated Diagnostic Agents] Rash    Metabolic Disorder Labs: No results found for: HGBA1C, MPG No results found for: PROLACTIN No results found for: CHOL, TRIG, HDL, CHOLHDL, VLDL, LDLCALC Lab Results  Component Value Date   TSH 0.81 11/17/2009    Therapeutic Level Labs: No results found for: LITHIUM No results found for: CBMZ No results found for: VALPROATE  Current Medications: Current Outpatient Medications  Medication Sig Dispense Refill  . ALPRAZolam (XANAX) 0.5 MG tablet Take 0.5 mg by mouth in the morning, at noon, in the evening, and at bedtime.     Marland Kitchen atomoxetine (STRATTERA) 25 MG capsule 1  qam 30 capsule 5  . cyclobenzaprine (FLEXERIL) 10 MG tablet Take 30 mg by mouth at bedtime.     . dicyclomine (BENTYL) 10 MG capsule Take 10 mg by mouth 4 (four) times daily.    . DULoxetine (CYMBALTA) 60 MG capsule Take 2 capsules (120 mg total) by mouth in the morning. 120 capsule 5  . fluorouracil (EFUDEX) 5 % cream Apply 1 application topically daily. Apply sparingly to left side of face    . gabapentin (NEURONTIN) 300 MG capsule Take 300 mg by mouth 2 (two) times daily.    . Multiple Vitamins-Minerals (MULTIVITAMIN WITH MINERALS) tablet Take 1 tablet by mouth daily.    . mupirocin ointment (BACTROBAN) 2 % Apply 1 application topically 2 (two) times daily. On left thigh    . Omega-3 Fatty Acids (FISH OIL) 1000 MG  CAPS Take 1 g by mouth daily.    Marland Kitchen PATADAY 0.2 % SOLN Place 1 drop into both eyes daily.     No current facility-administered medications for this visit.    Musculoskeletal: Strength & Muscle Tone: Normal gait & Station: normal Patient leans: N/A  Psychiatric Specialty Exam: ROS  There were no vitals  taken for this visit.There is no height or weight on file to calculate BMI.  General Appearance: Casual  Eye Contact:  Good  Speech:  Clear and Coherent  Volume:  Normal  Mood:  Negative  Affect:  Congruent  Thought Process:  Goal Directed  Orientation:  Full (Time, Place, and Person)  Thought Content:  WDL  Suicidal Thoughts:  No  Homicidal Thoughts:  No  Memory:  Negative  Judgement:  Fair  Insight:  Fair  Psychomotor Activity:  Normal  Concentration:    Recall:  Poor  Fund of Knowledge:Poor  Language: Fair  Akathisia:  No  Handed:  Right  AIMS (if indicated):  not done  Assets:  Desire for Improvement  ADL's:    Cognition: Impaired,  Mild  Sleep:  Fair   Screenings:   Assessment and Plan: 2/2/20222:39 PM  At this time the patient's problem seems to be that of major depression but it is better taking Cymbalta 120 mg.  I suspect this is also helping her pain.  We consider the possibility of attention deficit disorder but her childhood is so chaotic and erratic and there is frankly no real reason for her to be on any type of significant stimulant at this time.  I was willing to give her a trial of Strattera but she said it made her too anxious.  Therefore we will reduce it to 25 mg.  On her next visit unless it has any benefit at all we will discontinue her Strattera.  The possibility that is making her more anxious and more "jittery is unlikely.  This patient will be seen again in 4 months.

## 2020-10-22 ENCOUNTER — Other Ambulatory Visit (HOSPITAL_COMMUNITY): Payer: Self-pay | Admitting: Psychiatry

## 2020-11-09 ENCOUNTER — Ambulatory Visit (INDEPENDENT_AMBULATORY_CARE_PROVIDER_SITE_OTHER): Payer: Medicare Other | Admitting: Licensed Clinical Social Worker

## 2020-11-09 DIAGNOSIS — F3341 Major depressive disorder, recurrent, in partial remission: Secondary | ICD-10-CM

## 2020-11-09 DIAGNOSIS — F4312 Post-traumatic stress disorder, chronic: Secondary | ICD-10-CM

## 2020-11-09 NOTE — Progress Notes (Signed)
Virtual Visit via Telephone Note  I connected with Carla Tran on 11/09/20 at 10:00 AM EDT by telephone and verified that I am speaking with the correct person using two identifiers.  Location: Patient: home Provider: home office   I discussed the limitations, risks, security and privacy concerns of performing an evaluation and management service by telephone and the availability of in person appointments. I also discussed with the patient that there may be a patient responsible charge related to this service. The patient expressed understanding and agreed to proceed.   I discussed the assessment and treatment plan with the patient. The patient was provided an opportunity to ask questions and all were answered. The patient agreed with the plan and demonstrated an understanding of the instructions.   The patient was advised to call back or seek an in-person evaluation if the symptoms worsen or if the condition fails to improve as anticipated.  I provided 52 minutes of non-face-to-face time during this encounter.  THERAPIST PROGRESS NOTE  Session Time: 11:00 AM to 11:52 AM  Participation Level: Active  Behavioral Response: CasualAlertEuthymic, anxious  Type of Therapy: Individual Therapy  Treatment Goals addressed:  decrease depression, trauma, coping, stress management particularly medical issues as they play a significant part in depression and anxiety Interventions: Solution Focused, Strength-based, Supportive and Other: coping  Summary: Carla Tran is a 70 y.o. female who presents with discussing medical issues and doctors who she sees.  Issues with dermatologist so going back to primary care to her bumps on her chest.  Grand daughter going to have a baby. Patient says to her daughter not to direct her, not tell her anything because she is old enough to make decisions and she doesn't need to stress. Be quiet listen and help her. Sounds good to her to be great grandmother. Will  support her if she needs and wants someone to support her. Friends husband Dad [name]) died passed in Sep 13, 2022 hard to get rid of list on phone and accept it is real. Gave him something to help him pass. [employer] (first husband) brother Evelena Peat) passed. Current wife doesn't want her to go. Went to funeral home but not funeral. Patient understand why and thinks it is stupid. He has had four wives and doesn't even think about him anymore. In terms of symptoms she is better than was. Strattera helping her friend thinks so and therapist agrees she notices a differenece. Jittery if forget to take Xanax if remember doing ok. Reviewed stress with son if don't quit alcohol will lose everything. Patient knows from her own experience. July 1 prayed to have God take it away and says couldn't have done it without God.Martin Majestic to [place] didn't think that helped. Daughter said he stopped. It is a blessing not drinking. He did it for a year before when stopped drinking.          Therapist reviewed symptoms, facilitated expression of thoughts and feelings as significant intervention as patient is talking about her stressors helping her work through her feelings by talking about them and naming them.  Noted patient's advice is very helpful and noticed it was kind and empathetic indicating positive personality traits. Also that she has insights that are supportive and helpful. Noted progress in treatment goals as patient reports she is doing better.  Worked on coping strategies and also patient's achievement in being able to stop drinking noted this is significant achievement.  Therapist provided active listening, open questions, supportive interventions.  Validated patient on  how she was feeling with the stressors.  Provided positive feedback for patient doing better. Suicidal/Homicidal: No  Plan: 1.patient call for follow-up appointment plan to make appointment around a month. 2.  Therapist help patient process feelings to help  with her coping, helpful in managing stressors, provide supportive and strength-based interventions work on coping skills to manage her symptoms.  Diagnosis: Axis I:   Major depressive Disorder, recurrent, in partial remission, PTSD chronic    Axis II: No diagnosis    [name], LCSW 11/09/2020

## 2020-11-16 ENCOUNTER — Other Ambulatory Visit (HOSPITAL_COMMUNITY): Payer: Self-pay | Admitting: Psychiatry

## 2021-01-11 ENCOUNTER — Other Ambulatory Visit: Payer: Self-pay

## 2021-01-11 ENCOUNTER — Telehealth (INDEPENDENT_AMBULATORY_CARE_PROVIDER_SITE_OTHER): Payer: Medicare Other | Admitting: Psychiatry

## 2021-01-11 DIAGNOSIS — F329 Major depressive disorder, single episode, unspecified: Secondary | ICD-10-CM

## 2021-01-11 MED ORDER — DULOXETINE HCL 60 MG PO CPEP: 120.0000 mg | ORAL_CAPSULE | Freq: Every morning | ORAL | 5 refills | Status: DC

## 2021-01-11 NOTE — Progress Notes (Signed)
Psychiatric Initial Adult Assessment   Patient Identification: Carla Tran MRN:  [id number] Date of Evaluation:  01/11/2021 Referral Source: [name] Chief Complaint:   Visit Diagnosis: Major depression chronic  History of Present Illness:  Today unfortunately the patient is at her baseline.  She is a very chaotic life.  A lot of it is related to the bills that she cannot seem to get paid or go to work.  Her primary care doctor gives her Xanax 0.5 mg 4 times daily.  The patient is in chronic pain and sees multiple physicians.  For a while she ran out of her Cymbalta but now she is back to it.  She says it is helpful.  She says the Flexeril she gets from another doctor also is helpful.  For a while retrial her on the Strattera but it did not seem to work.  At this time she is taking 25 mg in a few weeks making her worse.  Today I clarified that she should discontinue her Strattera.  The patient has never really been in therapy.  She has multiple psychosocial stressors.  Today I insisted that she come and see me physically on her next visit in about 3 or 4 months.  The patient was seen last in 2020.  We give her no control agents.  The patient is not suicidal.  She drinks no alcohol and uses no drugs.  She speaks rapidly and it is hard to engage with.  The only thing we will continue is her Cymbalta.  Depression Symptoms:  disturbed sleep, (Hypo) Manic Symptoms:   Anxiety Symptoms:   Psychotic Symptoms:   PTSD Symptoms:   Past Psychiatric History: Under the care of Dr. Ronnald Ramp and hospitalized for alcohol treatment  Previous Psychotropic Medications: Xanax 0.5 mg 4 times daily, Celexa 40 mg, Seroquel unclear dose  Substance Abuse History in the last 12 months:  Yes.    Consequences of Substance Abuse:   Past Medical History:  Past Medical History:  Diagnosis Date  . Allergy   . Anxiety   . Bursitis    shoulder  . Cataract    forming   . Cervical cancer ([place])   .  Depression   . DJD (degenerative joint disease)   . Endometriosis   . Fibromyalgia   . Glaucoma   . Head ache   . Irregular heart beat   . PTSD (post-traumatic stress disorder)   . PTSD (post-traumatic stress disorder)   . Seizures ([place])    x 1 only- in early 2000's and none since   . Skin cancer   . Stroke [facility])     Past Surgical History:  Procedure Laterality Date  . BACK SURGERY    . CHOLECYSTECTOMY    . COLONOSCOPY    . ligaments removed from elbow    . PARTIAL HYSTERECTOMY    . skin cancer removed     from face x 2, abdomen and arm left   . TUBAL LIGATION      Family Psychiatric History:   Family History:  Family History  Problem Relation Age of Onset  . Diabetes Mother   . Prostate cancer Father   . Breast cancer Maternal Aunt   . Diabetes Sister   . Esophageal cancer Sister   . Colon cancer Neg Hx   . Rectal cancer Neg Hx   . Stomach cancer Neg Hx   . Colon polyps Neg Hx     Social History:   Social History  Socioeconomic History  . Marital status: Divorced    Spouse name: Not on file  . Number of children: 2  . Years of education: Not on file  . Highest education level: Not on file  Occupational History  . Occupation: disabled  Tobacco Use  . Smoking status: Never Smoker  . Smokeless tobacco: Never Used  Vaping Use  . Vaping Use: Never used  Substance and Sexual Activity  . Alcohol use: No    Alcohol/week: 0.0 standard drinks  . Drug use: Yes    Frequency: 1.0 times per week    Types: Marijuana  . Sexual activity: Not on file  Other Topics Concern  . Not on file  Social History Narrative  . Not on file   Social Determinants of Health   Financial Resource Strain: Not on file  Food Insecurity: Not on file  Transportation Needs: Not on file  Physical Activity: Not on file  Stress: Not on file  Social Connections: Not on file    Additional Social History:   Allergies:   Allergies  Allergen Reactions  . Penicillins Anaphylaxis   . Haemophilus Influenzae Vaccines Rash  . Mirtazapine Other (See Comments)    Pt states this medication made her sleep walk.  . Darvon [Propoxyphene] Other (See Comments)  . Sulfonamide Derivatives Other (See Comments)    Stomach Spasms   . Codeine Itching and Rash  . Ivp Dye [Iodinated Diagnostic Agents] Rash    Metabolic Disorder Labs: No results found for: HGBA1C, MPG No results found for: PROLACTIN No results found for: CHOL, TRIG, HDL, CHOLHDL, VLDL, LDLCALC Lab Results  Component Value Date   TSH 0.81 11/17/2009    Therapeutic Level Labs: No results found for: LITHIUM No results found for: CBMZ No results found for: VALPROATE  Current Medications: Current Outpatient Medications  Medication Sig Dispense Refill  . ALPRAZolam (XANAX) 0.5 MG tablet Take 0.5 mg by mouth in the morning, at noon, in the evening, and at bedtime.     . cyclobenzaprine (FLEXERIL) 10 MG tablet Take 30 mg by mouth at bedtime.     . dicyclomine (BENTYL) 10 MG capsule Take 10 mg by mouth 4 (four) times daily.    . DULoxetine (CYMBALTA) 60 MG capsule Take 2 capsules (120 mg total) by mouth in the morning. 120 capsule 5  . fluorouracil (EFUDEX) 5 % cream Apply 1 application topically daily. Apply sparingly to left side of face    . gabapentin (NEURONTIN) 300 MG capsule Take 300 mg by mouth 2 (two) times daily.    . Multiple Vitamins-Minerals (MULTIVITAMIN WITH MINERALS) tablet Take 1 tablet by mouth daily.    . mupirocin ointment (BACTROBAN) 2 % Apply 1 application topically 2 (two) times daily. On left thigh    . Omega-3 Fatty Acids (FISH OIL) 1000 MG CAPS Take 1 g by mouth daily.    Marland Kitchen PATADAY 0.2 % SOLN Place 1 drop into both eyes daily.     No current facility-administered medications for this visit.    Musculoskeletal: Strength & Muscle Tone: Normal gait & Station: normal Patient leans: N/A  Psychiatric Specialty Exam: ROS  There were no vitals taken for this visit.There is no height or  weight on file to calculate BMI.  General Appearance: Casual  Eye Contact:  Good  Speech:  Clear and Coherent  Volume:  Normal  Mood:  Negative  Affect:  Congruent  Thought Process:  Goal Directed  Orientation:  Full (Time, Place, and Person)  Thought Content:  WDL  Suicidal Thoughts:  No  Homicidal Thoughts:  No  Memory:  Negative  Judgement:  Fair  Insight:  Fair  Psychomotor Activity:  Normal  Concentration:    Recall:  Poor  Fund of Knowledge:Poor  Language: Fair  Akathisia:  No  Handed:  Right  AIMS (if indicated):  not done  Assets:  Desire for Improvement  ADL's:    Cognition: Impaired,  Mild  Sleep:  Fair   Screenings:   Assessment and Plan: 5/31/20222:37 PM   This patient is problem seems to be major depression amongst other issues.  I suspect she has an adjustment disorder with overwhelming anxiety.  At this time we will avoid any benzodiazepines in this patient.  We will continue with Cymbalta 120 mg.  The patient will have to come and see me in about 2 to 3 months and at that time we will talk to her about possibly being in therapy.  The patient still drives and she still does some things on her own.  It isnoted that she has an aide who comes in to see you a number of days during the week.  Patient is not suicidal.  I am not clear of her functional status.Marland Kitchen

## 2021-01-25 ENCOUNTER — Ambulatory Visit (INDEPENDENT_AMBULATORY_CARE_PROVIDER_SITE_OTHER): Payer: Medicare Other | Admitting: Licensed Clinical Social Worker

## 2021-01-25 DIAGNOSIS — F4312 Post-traumatic stress disorder, chronic: Secondary | ICD-10-CM

## 2021-01-25 DIAGNOSIS — F329 Major depressive disorder, single episode, unspecified: Secondary | ICD-10-CM | POA: Diagnosis not present
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
## 2021-03-19 ENCOUNTER — Other Ambulatory Visit (HOSPITAL_COMMUNITY): Payer: Self-pay | Admitting: Psychiatry

## 2021-04-20 ENCOUNTER — Other Ambulatory Visit: Payer: Self-pay

## 2021-04-20 ENCOUNTER — Telehealth (INDEPENDENT_AMBULATORY_CARE_PROVIDER_SITE_OTHER): Payer: Medicare Other | Admitting: Psychiatry

## 2021-04-20 DIAGNOSIS — F329 Major depressive disorder, single episode, unspecified: Secondary | ICD-10-CM

## 2021-04-20 MED ORDER — CITALOPRAM HYDROBROMIDE 10 MG PO TABS
ORAL_TABLET | ORAL | 3 refills | Status: DC
Start: 2021-04-20 — End: 2021-06-08

## 2021-04-20 NOTE — Progress Notes (Signed)
Psychiatric Initial Adult Assessment   Patient Identification: Carla Tran MRN:  [id number] Date of Evaluation:  04/20/2021 Referral Source: [name] Chief Complaint:   Visit Diagnosis: Major depression chronic  History of Present   Today the patient is at her baseline which is poor.  She is chronically overwhelmed.  She mainly has a lot of family issues.  Her children and her granddaughter cause a lot of problems.  The patient now and then will babysit for her 69-year-old granddaughter.  For the most part she is very stressed out about everything including her physical status.  Her primary care doctor gives her Xanax 25 mg 4 times daily and Flexeril 10 mg which she takes 3 of them at night.  The patient sleeps well.  The patient actually is eating okay.  The patient still drives but goes very short distances.  She drives to the [place] or another grocery stores.  Today she was supposed to come here but her transportation was going to take hours and she says she just could not take sitting for hours.  The patient spends a lot of time on her tablet playing games and listening to the news.  She in fact she does do some things for enjoyment but she claims she spends many many hours in bed in the dark alone.  I suspect she spends a lot of time actually on the tablet.  Last month unfortunately her sister died.  She apparently died at home.  The patient has an aide whose name is [name] who comes 3 days a week that she spends a lot of time talking to.  The patient actually does have a psychotherapist here, [name].  She apparently has an appointment in the next week.  This patient has been on 120 mg of Cymbalta for well over 6 months.  At this time we will go ahead and change her medicines.    Depression Symptoms:  disturbed sleep, (Hypo) Manic Symptoms:   Anxiety Symptoms:   Psychotic Symptoms:   PTSD Symptoms:   Past Psychiatric History: Under the care of Dr. Ronnald Ramp and hospitalized for  alcohol treatment  Previous Psychotropic Medications: Xanax 0.5 mg 4 times daily, Celexa 40 mg, Seroquel unclear dose  Substance Abuse History in the last 12 months:  Yes.    Consequences of Substance Abuse:   Past Medical History:  Past Medical History:  Diagnosis Date   Allergy    Anxiety    Bursitis    shoulder   Cataract    forming    Cervical cancer ([facility])    Depression    DJD (degenerative joint disease)    Endometriosis    Fibromyalgia    Glaucoma    Head ache    Irregular heart beat    PTSD (post-traumatic stress disorder)    PTSD (post-traumatic stress disorder)    Seizures ([facility])    x 1 only- in early 2000's and none since    Skin cancer    Stroke [facility])     Past Surgical History:  Procedure Laterality Date   BACK SURGERY     CHOLECYSTECTOMY     COLONOSCOPY     ligaments removed from elbow     PARTIAL HYSTERECTOMY     skin cancer removed     from face x 2, abdomen and arm left    TUBAL LIGATION      Family Psychiatric History:   Family History:  Family History  Problem Relation Age of Onset  Diabetes Mother    Prostate cancer Father    Breast cancer Maternal Aunt    Diabetes Sister    Esophageal cancer Sister    Colon cancer Neg Hx    Rectal cancer Neg Hx    Stomach cancer Neg Hx    Colon polyps Neg Hx     Social History:   Social History   Socioeconomic History   Marital status: Divorced    Spouse name: Not on file   Number of children: 2   Years of education: Not on file   Highest education level: Not on file  Occupational History   Occupation: disabled  Tobacco Use   Smoking status: Never   Smokeless tobacco: Never  Vaping Use   Vaping Use: Never used  Substance and Sexual Activity   Alcohol use: No    Alcohol/week: 0.0 standard drinks   Drug use: Yes    Frequency: 1.0 times per week    Types: Marijuana   Sexual activity: Not on file  Other Topics Concern   Not on file  Social History Narrative   Not on file   Social  Determinants of Health   Financial Resource Strain: Not on file  Food Insecurity: Not on file  Transportation Needs: Not on file  Physical Activity: Not on file  Stress: Not on file  Social Connections: Not on file    Additional Social History:   Allergies:   Allergies  Allergen Reactions   Penicillins Anaphylaxis   Haemophilus Influenzae Vaccines Rash   Mirtazapine Other (See Comments)    Pt states this medication made her sleep walk.   Darvon [Propoxyphene] Other (See Comments)   Sulfonamide Derivatives Other (See Comments)    Stomach Spasms    Codeine Itching and Rash   Ivp Dye [Iodinated Diagnostic Agents] Rash    Metabolic Disorder Labs: No results found for: HGBA1C, MPG No results found for: PROLACTIN No results found for: CHOL, TRIG, HDL, CHOLHDL, VLDL, LDLCALC Lab Results  Component Value Date   TSH 0.81 11/17/2009    Therapeutic Level Labs: No results found for: LITHIUM No results found for: CBMZ No results found for: VALPROATE  Current Medications: Current Outpatient Medications  Medication Sig Dispense Refill   citalopram (CELEXA) 10 MG tablet 1  qam 30 tablet 3   ALPRAZolam (XANAX) 0.5 MG tablet Take 0.5 mg by mouth in the morning, at noon, in the evening, and at bedtime.      cyclobenzaprine (FLEXERIL) 10 MG tablet Take 30 mg by mouth at bedtime.      dicyclomine (BENTYL) 10 MG capsule Take 10 mg by mouth 4 (four) times daily.     fluorouracil (EFUDEX) 5 % cream Apply 1 application topically daily. Apply sparingly to left side of face     gabapentin (NEURONTIN) 300 MG capsule Take 300 mg by mouth 2 (two) times daily.     Multiple Vitamins-Minerals (MULTIVITAMIN WITH MINERALS) tablet Take 1 tablet by mouth daily.     mupirocin ointment (BACTROBAN) 2 % Apply 1 application topically 2 (two) times daily. On left thigh     Omega-3 Fatty Acids (FISH OIL) 1000 MG CAPS Take 1 g by mouth daily.     PATADAY 0.2 % SOLN Place 1 drop into both eyes daily.     No  current facility-administered medications for this visit.    Musculoskeletal: Strength & Muscle Tone: Normal gait & Station: normal Patient leans: N/A  Psychiatric Specialty Exam: ROS  There were no vitals  taken for this visit.There is no height or weight on file to calculate BMI.  General Appearance: Casual  Eye Contact:  Good  Speech:  Clear and Coherent  Volume:  Normal  Mood:  Negative  Affect:  Congruent  Thought Process:  Goal Directed  Orientation:  Full (Time, Place, and Person)  Thought Content:  WDL  Suicidal Thoughts:  No  Homicidal Thoughts:  No  Memory:  Negative  Judgement:  Fair  Insight:  Fair  Psychomotor Activity:  Normal  Concentration:    Recall:  Poor  Fund of Knowledge:Poor  Language: Fair  Akathisia:  No  Handed:  Right  AIMS (if indicated):  not done  Assets:  Desire for Improvement  ADL's:    Cognition: Impaired,  Mild  Sleep:  Fair   Screenings:   Assessment and Plan: 9/7/20221:41 PM   This patient's first problem is that of major clinical depression.  It is very hard to get a clear history but the patient has been persistently depressed and seems to be somewhat anhedonic.  Her energy level is low and she describes psychomotor slowing.  She denies being suicidal.  This mood state she says has been like this for years but is much worse in the last few months.  At this time we will go ahead and reduce her Cymbalta to take just 60 mg for 1 week and then discontinue.  When she discontinues the Cymbalta she will begin on Celexa 10 mg in the morning.  Most importantly however she will continue in talking psychotherapy.  It should be noted the patient takes Xanax and Flexeril from her primary care doctor.  This patient will be reevaluated in 6 to 7 weeks.

## 2021-05-02 ENCOUNTER — Ambulatory Visit (INDEPENDENT_AMBULATORY_CARE_PROVIDER_SITE_OTHER): Payer: Medicare Other | Admitting: Licensed Clinical Social Worker

## 2021-05-02 DIAGNOSIS — F329 Major depressive disorder, single episode, unspecified: Secondary | ICD-10-CM

## 2021-05-02 DIAGNOSIS — F4312 Post-traumatic stress disorder, chronic: Secondary | ICD-10-CM
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
## 2021-06-08 ENCOUNTER — Other Ambulatory Visit: Payer: Self-pay

## 2021-06-08 ENCOUNTER — Telehealth (HOSPITAL_BASED_OUTPATIENT_CLINIC_OR_DEPARTMENT_OTHER): Payer: Medicare Other | Admitting: Psychiatry

## 2021-06-08 DIAGNOSIS — F325 Major depressive disorder, single episode, in full remission: Secondary | ICD-10-CM | POA: Diagnosis not present

## 2021-06-08 MED ORDER — CITALOPRAM HYDROBROMIDE 10 MG PO TABS
ORAL_TABLET | ORAL | 3 refills | Status: DC
Start: 2021-06-08 — End: 2021-11-30

## 2021-06-08 NOTE — Progress Notes (Signed)
Psychiatric Initial Adult Assessment   Patient Identification: Carla Tran MRN:  [id number] Date of Evaluation:  06/08/2021 Referral Source: [name] Chief Complaint:   Visit Diagnosis: Major depression chronic  History of Present   Today the patient is doing better.  She is off the Cymbalta and on Celexa 10 mg.  She clearly is better and her mood is happier.  She said the Cymbalta lost its benefits.  She does not have any more pain being off the Cymbalta.  Note is that she take Xanax 0.25 mg 4 times daily and Flexeril 10 mg 3 at night all from her primary care physician.  Patient says now she is sleeping and eating well.  She is got good energy.  She has no problems thinking or concentrating.  She has ongoing continuing issues with her daughter.  Presently she is in therapy with [name] and has a return appointment in about a week.  It is noted the patient's granddaughter is pregnant and there are issues all around that.  The patient enjoys watching TV.  She plays games on her computer.  She is positive and optimistic.  She has an aide come into her house 3 times a week that helps out.  She spends a lot of time talking to her.  The patient has no evidence of psychosis.  She is functioning very well.  She denies the use of alcohol or drugs.  She takes her medicines as prescribed.   Depression Symptoms:  disturbed sleep, (Hypo) Manic Symptoms:   Anxiety Symptoms:   Psychotic Symptoms:   PTSD Symptoms:   Past Psychiatric History: Under the care of Dr. Ronnald Ramp and hospitalized for alcohol treatment  Previous Psychotropic Medications: Xanax 0.5 mg 4 times daily, Celexa 40 mg, Seroquel unclear dose  Substance Abuse History in the last 12 months:  Yes.    Consequences of Substance Abuse:   Past Medical History:  Past Medical History:  Diagnosis Date   Allergy    Anxiety    Bursitis    shoulder   Cataract    forming    Cervical cancer (HCC)    Depression    DJD  (degenerative joint disease)    Endometriosis    Fibromyalgia    Glaucoma    Head ache    Irregular heart beat    PTSD (post-traumatic stress disorder)    PTSD (post-traumatic stress disorder)    Seizures (HCC)    x 1 only- in early 2000's and none since    Skin cancer    Stroke [facility])     Past Surgical History:  Procedure Laterality Date   BACK SURGERY     CHOLECYSTECTOMY     COLONOSCOPY     ligaments removed from elbow     PARTIAL HYSTERECTOMY     skin cancer removed     from face x 2, abdomen and arm left    TUBAL LIGATION      Family Psychiatric History:   Family History:  Family History  Problem Relation Age of Onset   Diabetes Mother    Prostate cancer Father    Breast cancer Maternal Aunt    Diabetes Sister    Esophageal cancer Sister    Colon cancer Neg Hx    Rectal cancer Neg Hx    Stomach cancer Neg Hx    Colon polyps Neg Hx     Social History:   Social History   Socioeconomic History   Marital status: Divorced  Spouse name: Not on file   Number of children: 2   Years of education: Not on file   Highest education level: Not on file  Occupational History   Occupation: disabled  Tobacco Use   Smoking status: Never   Smokeless tobacco: Never  Vaping Use   Vaping Use: Never used  Substance and Sexual Activity   Alcohol use: No    Alcohol/week: 0.0 standard drinks   Drug use: Yes    Frequency: 1.0 times per week    Types: Marijuana   Sexual activity: Not on file  Other Topics Concern   Not on file  Social History Narrative   Not on file   Social Determinants of Health   Financial Resource Strain: Not on file  Food Insecurity: Not on file  Transportation Needs: Not on file  Physical Activity: Not on file  Stress: Not on file  Social Connections: Not on file    Additional Social History:   Allergies:   Allergies  Allergen Reactions   Penicillins Anaphylaxis   Haemophilus Influenzae Vaccines Rash   Mirtazapine Other (See  Comments)    Pt states this medication made her sleep walk.   Darvon [Propoxyphene] Other (See Comments)   Sulfonamide Derivatives Other (See Comments)    Stomach Spasms    Codeine Itching and Rash   Ivp Dye [Iodinated Diagnostic Agents] Rash    Metabolic Disorder Labs: No results found for: HGBA1C, MPG No results found for: PROLACTIN No results found for: CHOL, TRIG, HDL, CHOLHDL, VLDL, LDLCALC Lab Results  Component Value Date   TSH 0.81 11/17/2009    Therapeutic Level Labs: No results found for: LITHIUM No results found for: CBMZ No results found for: VALPROATE  Current Medications: Current Outpatient Medications  Medication Sig Dispense Refill   ALPRAZolam (XANAX) 0.5 MG tablet Take 0.5 mg by mouth in the morning, at noon, in the evening, and at bedtime.      citalopram (CELEXA) 10 MG tablet 1  qam 90 tablet 3   cyclobenzaprine (FLEXERIL) 10 MG tablet Take 30 mg by mouth at bedtime.      dicyclomine (BENTYL) 10 MG capsule Take 10 mg by mouth 4 (four) times daily.     fluorouracil (EFUDEX) 5 % cream Apply 1 application topically daily. Apply sparingly to left side of face     gabapentin (NEURONTIN) 300 MG capsule Take 300 mg by mouth 2 (two) times daily.     Multiple Vitamins-Minerals (MULTIVITAMIN WITH MINERALS) tablet Take 1 tablet by mouth daily.     mupirocin ointment (BACTROBAN) 2 % Apply 1 application topically 2 (two) times daily. On left thigh     Omega-3 Fatty Acids (FISH OIL) 1000 MG CAPS Take 1 g by mouth daily.     PATADAY 0.2 % SOLN Place 1 drop into both eyes daily.     No current facility-administered medications for this visit.    Musculoskeletal: Strength & Muscle Tone: Normal gait & Station: normal Patient leans: N/A  Psychiatric Specialty Exam: ROS  There were no vitals taken for this visit.There is no height or weight on file to calculate BMI.  General Appearance: Casual  Eye Contact:  Good  Speech:  Clear and Coherent  Volume:  Normal  Mood:   Negative  Affect:  Congruent  Thought Process:  Goal Directed  Orientation:  Full (Time, Place, and Person)  Thought Content:  WDL  Suicidal Thoughts:  No  Homicidal Thoughts:  No  Memory:  Negative  Judgement:  Fair  Insight:  Fair  Psychomotor Activity:  Normal  Concentration:    Recall:  Poor  Fund of Knowledge:Poor  Language: Fair  Akathisia:  No  Handed:  Right  AIMS (if indicated):  not done  Assets:  Desire for Improvement  ADL's:    Cognition: Impaired,  Mild  Sleep:  Fair   Screenings:   Assessment and Plan: 06/08/2021  This patient's first problem is that of major depression.  At this time she is doing much better and her mood is better.  We change her from Cymbalta to Celexa and it seems to help.  She takes just a 10 mg dose of her Celexa and is doing fine.  Patient continue on this agent and continue in her therapy as well.  The patient will return to see me in 6 months.

## 2021-06-14 ENCOUNTER — Ambulatory Visit (HOSPITAL_COMMUNITY): Payer: Medicare Other | Admitting: Licensed Clinical Social Worker

## 2021-06-14 NOTE — Progress Notes (Signed)
Patient had in person session and did not show. Session is a no show

## 2021-06-17 ENCOUNTER — Ambulatory Visit (INDEPENDENT_AMBULATORY_CARE_PROVIDER_SITE_OTHER): Payer: Medicare Other | Admitting: Licensed Clinical Social Worker

## 2021-06-17 DIAGNOSIS — F4312 Post-traumatic stress disorder, chronic: Secondary | ICD-10-CM | POA: Diagnosis not present

## 2021-06-17 DIAGNOSIS — F325 Major depressive disorder, single episode, in full remission: Secondary | ICD-10-CM | POA: Diagnosis not present
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
## 2021-09-01 ENCOUNTER — Ambulatory Visit (INDEPENDENT_AMBULATORY_CARE_PROVIDER_SITE_OTHER): Payer: Medicare Other | Admitting: Licensed Clinical Social Worker

## 2021-09-01 DIAGNOSIS — F329 Major depressive disorder, single episode, unspecified: Secondary | ICD-10-CM | POA: Diagnosis not present

## 2021-09-01 DIAGNOSIS — F4312 Post-traumatic stress disorder, chronic: Secondary | ICD-10-CM
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
## 2021-10-06 ENCOUNTER — Ambulatory Visit (INDEPENDENT_AMBULATORY_CARE_PROVIDER_SITE_OTHER): Payer: Medicare Other | Admitting: Licensed Clinical Social Worker

## 2021-10-06 DIAGNOSIS — F4312 Post-traumatic stress disorder, chronic: Secondary | ICD-10-CM | POA: Diagnosis not present

## 2021-10-06 DIAGNOSIS — F329 Major depressive disorder, single episode, unspecified: Secondary | ICD-10-CM
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
## 2021-11-30 ENCOUNTER — Telehealth (HOSPITAL_BASED_OUTPATIENT_CLINIC_OR_DEPARTMENT_OTHER): Payer: Medicare Other | Admitting: Psychiatry

## 2021-11-30 DIAGNOSIS — F325 Major depressive disorder, single episode, in full remission: Secondary | ICD-10-CM | POA: Diagnosis not present

## 2021-11-30 MED ORDER — ALPRAZOLAM 0.5 MG PO TABS: 0.5000 mg | ORAL_TABLET | Freq: Four times a day (QID) | ORAL | 3 refills | Status: DC

## 2021-11-30 MED ORDER — CITALOPRAM HYDROBROMIDE 10 MG PO TABS: ORAL_TABLET | ORAL | 3 refills | Status: DC

## 2021-11-30 NOTE — Progress Notes (Addendum)
Psychiatric Initial Adult Assessment   Patient Identification: Carla Tran MRN:  [id number] Date of Evaluation:  11/30/2021 Referral Source: [name] Chief Complaint:   Visit Diagnosis: Major depression chronic  History of Present  Today the patient is doing better.  Celexa is very helpful.  Her biggest problem is days that she had a bout with having bedbugs.  It was hard to understand how they got there but it has been of [facility] hospital program.  Generally she has managed the sleep fairly well.  She continues to function fairly well her anxiety is relatively controlled with Xanax.  The patient drinks no alcohol and uses no drugs.  Her health is reasonably good.  She is stable.  Depression Symptoms:  disturbed sleep, (Hypo) Manic Symptoms:   Anxiety Symptoms:   Psychotic Symptoms:   PTSD Symptoms:   Past Psychiatric History: Under the care of Dr. Ronnald Ramp and hospitalized for alcohol treatment  Previous Psychotropic Medications: Xanax 0.5 mg 4 times daily, Celexa 40 mg, Seroquel unclear dose  Substance Abuse History in the last 12 months:  Yes.    Consequences of Substance Abuse:   Past Medical History:  Past Medical History:  Diagnosis Date   Allergy    Anxiety    Bursitis    shoulder   Cataract    forming    Cervical cancer (HCC)    Depression    DJD (degenerative joint disease)    Endometriosis    Fibromyalgia    Glaucoma    Head ache    Irregular heart beat    PTSD (post-traumatic stress disorder)    PTSD (post-traumatic stress disorder)    Seizures (HCC)    x 1 only- in early 2000's and none since    Skin cancer    Stroke [facility])     Past Surgical History:  Procedure Laterality Date   BACK SURGERY     CHOLECYSTECTOMY     COLONOSCOPY     ligaments removed from elbow     PARTIAL HYSTERECTOMY     skin cancer removed     from face x 2, abdomen and arm left    TUBAL LIGATION      Family Psychiatric History:   Family History:  Family History   Problem Relation Age of Onset   Diabetes Mother    Prostate cancer Father    Breast cancer Maternal Aunt    Diabetes Sister    Esophageal cancer Sister    Colon cancer Neg Hx    Rectal cancer Neg Hx    Stomach cancer Neg Hx    Colon polyps Neg Hx     Social History:   Social History   Socioeconomic History   Marital status: Divorced    Spouse name: Not on file   Number of children: 2   Years of education: Not on file   Highest education level: Not on file  Occupational History   Occupation: disabled  Tobacco Use   Smoking status: Never   Smokeless tobacco: Never  Vaping Use   Vaping Use: Never used  Substance and Sexual Activity   Alcohol use: No    Alcohol/week: 0.0 standard drinks   Drug use: Yes    Frequency: 1.0 times per week    Types: Marijuana   Sexual activity: Not on file  Other Topics Concern   Not on file  Social History Narrative   Not on file   Social Determinants of Health   Financial Resource Strain: Not on  file  Food Insecurity: Not on file  Transportation Needs: Not on file  Physical Activity: Not on file  Stress: Not on file  Social Connections: Not on file    Additional Social History:   Allergies:   Allergies  Allergen Reactions   Penicillins Anaphylaxis   Haemophilus Influenzae Vaccines Rash   Mirtazapine Other (See Comments)    Pt states this medication made her sleep walk.   Darvon [Propoxyphene] Other (See Comments)   Sulfonamide Derivatives Other (See Comments)    Stomach Spasms    Codeine Itching and Rash   Ivp Dye [Iodinated Contrast Media] Rash    Metabolic Disorder Labs: No results found for: HGBA1C, MPG No results found for: PROLACTIN No results found for: CHOL, TRIG, HDL, CHOLHDL, VLDL, LDLCALC Lab Results  Component Value Date   TSH 0.81 11/17/2009    Therapeutic Level Labs: No results found for: LITHIUM No results found for: CBMZ No results found for: VALPROATE  Current Medications: Current Outpatient  Medications  Medication Sig Dispense Refill   ALPRAZolam (XANAX) 0.5 MG tablet Take 1 tablet (0.5 mg total) by mouth in the morning, at noon, in the evening, and at bedtime. 120 tablet 3   citalopram (CELEXA) 10 MG tablet 1  qam 90 tablet 3   cyclobenzaprine (FLEXERIL) 10 MG tablet Take 30 mg by mouth at bedtime.      dicyclomine (BENTYL) 10 MG capsule Take 10 mg by mouth 4 (four) times daily.     fluorouracil (EFUDEX) 5 % cream Apply 1 application topically daily. Apply sparingly to left side of face     gabapentin (NEURONTIN) 300 MG capsule Take 300 mg by mouth 2 (two) times daily.     Multiple Vitamins-Minerals (MULTIVITAMIN WITH MINERALS) tablet Take 1 tablet by mouth daily.     mupirocin ointment (BACTROBAN) 2 % Apply 1 application topically 2 (two) times daily. On left thigh     Omega-3 Fatty Acids (FISH OIL) 1000 MG CAPS Take 1 g by mouth daily.     PATADAY 0.2 % SOLN Place 1 drop into both eyes daily.     No current facility-administered medications for this visit.    Musculoskeletal: Strength & Muscle Tone: Normal gait & Station: normal Patient leans: N/A  Psychiatric Specialty Exam: ROS  There were no vitals taken for this visit.There is no height or weight on file to calculate BMI.  General Appearance: Casual  Eye Contact:  Good  Speech:  Clear and Coherent  Volume:  Normal  Mood:  Negative  Affect:  Congruent  Thought Process:  Goal Directed  Orientation:  Full (Time, Place, and Person)  Thought Content:  WDL  Suicidal Thoughts:  No  Homicidal Thoughts:  No  Memory:  Negative  Judgement:  Fair  Insight:  Fair  Psychomotor Activity:  Normal  Concentration:    Recall:  Poor  Fund of Knowledge:Poor  Language: Fair  Akathisia:  No  Handed:  Right  AIMS (if indicated):  not done  Assets:  Desire for Improvement  ADL's:    Cognition: Impaired,  Mild  Sleep:  Fair   Screenings:   Assessment and Plan: 11/30/2021  This patient's first problem is that of major  depression.  She will continue taking Celexa as ordered.  Her second problem is an adjustment disorder with an anxious mood state.  She continues to have multiple significant stresses.  She takes Xanax just as prescribed.  She drinks no alcohol and uses no drugs.  She  is doing her best.  She lives far away so this looks like it will be another phone interview on her next visit in 3-1/2 months. Phone visit  spent 30 minutes patient at home  I called from center

## 2021-12-07 ENCOUNTER — Ambulatory Visit (INDEPENDENT_AMBULATORY_CARE_PROVIDER_SITE_OTHER): Payer: Medicare Other | Admitting: Licensed Clinical Social Worker

## 2021-12-07 DIAGNOSIS — F4312 Post-traumatic stress disorder, chronic: Secondary | ICD-10-CM

## 2021-12-07 DIAGNOSIS — F329 Major depressive disorder, single episode, unspecified: Secondary | ICD-10-CM
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
## 2022-02-16 ENCOUNTER — Ambulatory Visit (HOSPITAL_COMMUNITY): Payer: Medicare Other | Admitting: Licensed Clinical Social Worker

## 2022-02-16 NOTE — Progress Notes (Signed)
Patient scheduled for phone appointment therapist explained can no longer do phone appointment so patient will figure out how to set that up and call to reschedule.  Session is a canceled appointment by therapist

## 2022-03-14 ENCOUNTER — Telehealth (HOSPITAL_BASED_OUTPATIENT_CLINIC_OR_DEPARTMENT_OTHER): Payer: Medicare Other | Admitting: Psychiatry

## 2022-03-14 DIAGNOSIS — F324 Major depressive disorder, single episode, in partial remission: Secondary | ICD-10-CM | POA: Diagnosis not present

## 2022-03-14 MED ORDER — CITALOPRAM HYDROBROMIDE 20 MG PO TABS: ORAL_TABLET | ORAL | 5 refills | Status: DC

## 2022-03-14 MED ORDER — HYDROXYZINE PAMOATE 25 MG PO CAPS: ORAL_CAPSULE | ORAL | 5 refills | Status: DC

## 2022-03-14 MED ORDER — ALPRAZOLAM 0.5 MG PO TABS: ORAL_TABLET | ORAL | 3 refills | Status: DC

## 2022-03-14 NOTE — Progress Notes (Signed)
Psychiatric Initial Adult Assessment   Patient Identification: Carla Tran MRN:  [id number] Date of Evaluation:  03/14/2022 Referral Source: [name] Chief Complaint:   Visit Diagnosis: Major depression chronic  History of Present  Today patient is doing fairly well.  She still has some issues with her kids.  Unfortunately her son and daughter-in-law are getting separated and that affects her grandson.  Other than this the patient seems to be stable.  She continues to live independently.  She drinks no alcohol and uses no drugs.  Unfortunately it looks like driving is going to be less and less for her.  She is having difficulty driving.  She does have a lot of medical care which is good.  Her anxiety level is reasonably well controlled.  She denies persistent depression.  She has no evidence of psychosis.  This was a phone conversation today. Virtual Visit via Telephone Note  I connected with Carla Tran on 03/14/22 at  1:30 PM EDT by telephone and verified that I am speaking with the correct person using two identifiers.  Location: Patient: home Provider: office   I discussed the limitations, risks, security and privacy concerns of performing an evaluation and management service by telephone and the availability of in person appointments. I also discussed with the patient that there may be a patient responsible charge related to this service. The patient expressed understanding and agreed to proceed. :    I discussed the assessment and treatment plan with the patient. The patient was provided an opportunity to ask questions and all were answered. The patient agreed with the plan and demonstrated an understanding of the instructions.   The patient was advised to call back or seek an in-person evaluation if the symptoms worsen or if the condition fails to improve as anticipated.  I provided 30 minutes of non-face-to-face time during this encounter.   Jerral Ralph, MD    Depression Symptoms:  disturbed sleep, (Hypo) Manic Symptoms:   Anxiety Symptoms:   Psychotic Symptoms:   PTSD Symptoms:   Past Psychiatric History: Under the care of Dr. Ronnald Ramp and hospitalized for alcohol treatment  Previous Psychotropic Medications: Xanax 0.5 mg 4 times daily, Celexa 40 mg, Seroquel unclear dose  Substance Abuse History in the last 12 months:  Yes.    Consequences of Substance Abuse:   Past Medical History:  Past Medical History:  Diagnosis Date   Allergy    Anxiety    Bursitis    shoulder   Cataract    forming    Cervical cancer (HCC)    Depression    DJD (degenerative joint disease)    Endometriosis    Fibromyalgia    Glaucoma    Head ache    Irregular heart beat    PTSD (post-traumatic stress disorder)    PTSD (post-traumatic stress disorder)    Seizures (HCC)    x 1 only- in early 2000's and none since    Skin cancer    Stroke [facility])     Past Surgical History:  Procedure Laterality Date   BACK SURGERY     CHOLECYSTECTOMY     COLONOSCOPY     ligaments removed from elbow     PARTIAL HYSTERECTOMY     skin cancer removed     from face x 2, abdomen and arm left    TUBAL LIGATION      Family Psychiatric History:   Family History:  Family History  Problem Relation Age of Onset  Diabetes Mother    Prostate cancer Father    Breast cancer Maternal Aunt    Diabetes Sister    Esophageal cancer Sister    Colon cancer Neg Hx    Rectal cancer Neg Hx    Stomach cancer Neg Hx    Colon polyps Neg Hx     Social History:   Social History   Socioeconomic History   Marital status: Divorced    Spouse name: Not on file   Number of children: 2   Years of education: Not on file   Highest education level: Not on file  Occupational History   Occupation: disabled  Tobacco Use   Smoking status: Never   Smokeless tobacco: Never  Vaping Use   Vaping Use: Never used  Substance and Sexual Activity   Alcohol use: No    Alcohol/week: 0.0  standard drinks of alcohol   Drug use: Yes    Frequency: 1.0 times per week    Types: Marijuana   Sexual activity: Not on file  Other Topics Concern   Not on file  Social History Narrative   Not on file   Social Determinants of Health   Financial Resource Strain: Not on file  Food Insecurity: Not on file  Transportation Needs: Not on file  Physical Activity: Not on file  Stress: Not on file  Social Connections: Not on file    Additional Social History:   Allergies:   Allergies  Allergen Reactions   Penicillins Anaphylaxis   Haemophilus Influenzae Vaccines Rash   Mirtazapine Other (See Comments)    Pt states this medication made her sleep walk.   Darvon [Propoxyphene] Other (See Comments)   Sulfonamide Derivatives Other (See Comments)    Stomach Spasms    Codeine Itching and Rash   Ivp Dye [Iodinated Contrast Media] Rash    Metabolic Disorder Labs: No results found for: "HGBA1C", "MPG" No results found for: "PROLACTIN" No results found for: "CHOL", "TRIG", "HDL", "CHOLHDL", "VLDL", "LDLCALC" Lab Results  Component Value Date   TSH 0.81 11/17/2009    Therapeutic Level Labs: No results found for: "LITHIUM" No results found for: "CBMZ" No results found for: "VALPROATE"  Current Medications: Current Outpatient Medications  Medication Sig Dispense Refill   hydrOXYzine (VISTARIL) 25 MG capsule 1  qhs   1   q day prn 35 capsule 5   ALPRAZolam (XANAX) 0.5 MG tablet 1 tid 90 tablet 3   citalopram (CELEXA) 20 MG tablet 1  qam 30 tablet 5   cyclobenzaprine (FLEXERIL) 10 MG tablet Take 30 mg by mouth at bedtime.      dicyclomine (BENTYL) 10 MG capsule Take 10 mg by mouth 4 (four) times daily.     fluorouracil (EFUDEX) 5 % cream Apply 1 application topically daily. Apply sparingly to left side of face     gabapentin (NEURONTIN) 300 MG capsule Take 300 mg by mouth 2 (two) times daily.     Multiple Vitamins-Minerals (MULTIVITAMIN WITH MINERALS) tablet Take 1 tablet by  mouth daily.     mupirocin ointment (BACTROBAN) 2 % Apply 1 application topically 2 (two) times daily. On left thigh     Omega-3 Fatty Acids (FISH OIL) 1000 MG CAPS Take 1 g by mouth daily.     PATADAY 0.2 % SOLN Place 1 drop into both eyes daily.     No current facility-administered medications for this visit.    Musculoskeletal: Strength & Muscle Tone: Normal gait & Station: normal Patient leans: N/A  Psychiatric Specialty Exam: ROS  There were no vitals taken for this visit.There is no height or weight on file to calculate BMI.  General Appearance: Casual  Eye Contact:  Good  Speech:  Clear and Coherent  Volume:  Normal  Mood:  Negative  Affect:  Congruent  Thought Process:  Goal Directed  Orientation:  Full (Time, Place, and Person)  Thought Content:  WDL  Suicidal Thoughts:  No  Homicidal Thoughts:  No  Memory:  Negative  Judgement:  Fair  Insight:  Fair  Psychomotor Activity:  Normal  Concentration:    Recall:  Poor  Fund of Knowledge:Poor  Language: Fair  Akathisia:  No  Handed:  Right  AIMS (if indicated):  not done  Assets:  Desire for Improvement  ADL's:    Cognition: Impaired,  Mild  Sleep:  Fair   Screenings:   Assessment and Plan: 03/14/2022  This patient's first problem is major depression.  We will clarify that she is still taking Celexa 20 mg.  Was not clear in the past but was 40 mg and then at some point 10 mg.  I believe Celexa is the best choice for her.  Her second problem is an adjustment disorder with an anxious mood state.  Today we are going to reduce her Xanax 0.5 mg from 4 times daily down to 3 times daily.  The patient also will take Vistaril 25 mg at night for sleep and 1 extra if she needs it as needed.  We will speak to her again in 3 months and at that time we will further attempt to reduce her Xanax if possible.  The patient had no falls.  I believe she is stable.

## 2022-03-18 ENCOUNTER — Other Ambulatory Visit: Payer: Self-pay

## 2022-03-18 ENCOUNTER — Encounter (HOSPITAL_COMMUNITY): Payer: Self-pay

## 2022-03-18 ENCOUNTER — Emergency Department (HOSPITAL_COMMUNITY)
Admission: EM | Admit: 2022-03-18 | Discharge: 2022-03-18 | Disposition: A | Discharge status: Home / Self Care | Payer: Medicare Other | Attending: Emergency Medicine | Admitting: Emergency Medicine

## 2022-03-18 ENCOUNTER — Emergency Department (HOSPITAL_COMMUNITY): Payer: Medicare Other

## 2022-03-18 DIAGNOSIS — D649 Anemia, unspecified: Secondary | ICD-10-CM | POA: Diagnosis not present

## 2022-03-18 DIAGNOSIS — R109 Unspecified abdominal pain: Secondary | ICD-10-CM | POA: Insufficient documentation

## 2022-03-18 DIAGNOSIS — F419 Anxiety disorder, unspecified: Secondary | ICD-10-CM | POA: Diagnosis present

## 2022-03-18 DIAGNOSIS — R519 Headache, unspecified: Secondary | ICD-10-CM | POA: Diagnosis not present

## 2022-03-18 DIAGNOSIS — E871 Hypo-osmolality and hyponatremia: Secondary | ICD-10-CM | POA: Diagnosis not present

## 2022-03-18 LAB — COMPREHENSIVE METABOLIC PANEL
ALT: 17 U/L (ref 0–44)
AST: 18 U/L (ref 15–41)
Albumin: 4.1 g/dL (ref 3.5–5.0)
Alkaline Phosphatase: 48 U/L (ref 38–126)
Anion gap: 8 (ref 5–15)
BUN: 17 mg/dL (ref 8–23)
CO2: 26 mmol/L (ref 22–32)
Calcium: 8.9 mg/dL (ref 8.9–10.3)
Chloride: 99 mmol/L (ref 98–111)
Creatinine, Ser: 0.94 mg/dL (ref 0.44–1.00)
GFR, Estimated: >60 mL/min (ref >60)
Glucose, Bld: 124 mg/dL — ABNORMAL HIGH (ref 70–99)
Potassium: 4.2 mmol/L (ref 3.5–5.1)
Sodium: 133 mmol/L — ABNORMAL LOW (ref 135–145)
Total Bilirubin: 0.5 mg/dL (ref 0.3–1.2)
Total Protein: 7.1 g/dL (ref 6.5–8.1)

## 2022-03-18 LAB — URINALYSIS, ROUTINE W REFLEX MICROSCOPIC
Bilirubin Urine: NEGATIVE
Glucose, UA: NEGATIVE mg/dL
Hgb urine dipstick: NEGATIVE
Ketones, ur: 5 mg/dL — AB
Leukocytes,Ua: NEGATIVE
Nitrite: NEGATIVE
Protein, ur: NEGATIVE mg/dL
Specific Gravity, Urine: 1.005 (ref 1.005–1.030)
pH: 7 (ref 5.0–8.0)

## 2022-03-18 LAB — CBC WITH DIFFERENTIAL/PLATELET
Abs Immature Granulocytes: 0.04 10*3/uL (ref 0.00–0.07)
Basophils Absolute: 0.1 10*3/uL (ref 0.0–0.1)
Basophils Relative: 1 %
Eosinophils Absolute: 0.1 10*3/uL (ref 0.0–0.5)
Eosinophils Relative: 1 %
HCT: 35.8 % — ABNORMAL LOW (ref 36.0–46.0)
Hemoglobin: 11.9 g/dL — ABNORMAL LOW (ref 12.0–15.0)
Immature Granulocytes: 0 %
Lymphocytes Relative: 25 %
Lymphs Abs: 2.5 10*3/uL (ref 0.7–4.0)
MCH: 29.8 pg (ref 26.0–34.0)
MCHC: 33.2 g/dL (ref 30.0–36.0)
MCV: 89.5 fL (ref 80.0–100.0)
Monocytes Absolute: 0.5 10*3/uL (ref 0.1–1.0)
Monocytes Relative: 5 %
Neutro Abs: 6.7 10*3/uL (ref 1.7–7.7)
Neutrophils Relative %: 68 %
Platelets: 286 10*3/uL (ref 150–400)
RBC: 4 MIL/uL (ref 3.87–5.11)
RDW: 13.7 % (ref 11.5–15.5)
WBC: 9.9 10*3/uL (ref 4.0–10.5)
nRBC: 0 % (ref 0.0–0.2)

## 2022-03-18 MED ORDER — LORAZEPAM 2 MG/ML IJ SOLN
1.0000 mg | Freq: Once | INTRAMUSCULAR | Status: AC
  Administered 2022-03-18: 1 mg via INTRAMUSCULAR
  Filled 2022-03-18: qty 1

## 2022-03-18 MED ORDER — ACETAMINOPHEN 325 MG PO TABS
650.0000 mg | ORAL_TABLET | Freq: Once | ORAL | Status: AC
  Administered 2022-03-18: 650 mg via ORAL
  Filled 2022-03-18: qty 2

## 2022-03-18 MED ORDER — CYCLOBENZAPRINE HCL 10 MG PO TABS
5.0000 mg | ORAL_TABLET | Freq: Once | ORAL | Status: AC
  Administered 2022-03-18: 5 mg via ORAL
  Filled 2022-03-18: qty 1

## 2022-03-18 NOTE — ED Triage Notes (Signed)
Pt from home via [facility]. Called EMS stating she was having a panic attack due to not having any Xanax to take since 1 am this morning. States that her primary dr and psychiatrist have messed with her meds and she ran out, but supposed to be getting some more today. Also c/o chronic bilateral leg pain.

## 2022-03-18 NOTE — ED Notes (Signed)
Pt req water. Advised RN

## 2022-03-18 NOTE — ED Provider Notes (Signed)
[facility] EMERGENCY DEPARTMENT Provider Note   CSN: [id number] Arrival date & time: 03/18/22  1419     History Chief Complaint  Patient presents with   Anxiety    Lynell Greenhouse is a 71 y.o. female with history of anxiety who presents to the emergency department with multiple complaints.  Patient states that she took her last Xanax around 12 AM this morning.  She is very nervous as her Xanax prescription did not arrive yesterday.  Patient recently was started on Cymbalta which she thinks is making her act funny.  She reports associated abdominal bloating, unclear thoughts but is not suicidal homicidal, and headache.  Patient states she has not had a bowel movement in 2 weeks.  She is also having trouble urinating.  Anxiety       Home Medications Prior to Admission medications   Medication Sig Start Date End Date Taking? Authorizing Provider  ALPRAZolam Duanne Moron) 0.5 MG tablet 1 tid 03/14/22   [name], Berneta Sages, MD  citalopram (CELEXA) 20 MG tablet 1  qam 03/14/22   [name], Berneta Sages, MD  cyclobenzaprine (FLEXERIL) 10 MG tablet Take 30 mg by mouth at bedtime.  03/25/19   [provider]  dicyclomine (BENTYL) 10 MG capsule Take 10 mg by mouth 4 (four) times daily. 02/24/20   [provider]  fluorouracil (EFUDEX) 5 % cream Apply 1 application topically daily. Apply sparingly to left side of face 02/10/20   [provider]  gabapentin (NEURONTIN) 300 MG capsule Take 300 mg by mouth 2 (two) times daily. 04/04/14   [provider]  hydrOXYzine (VISTARIL) 25 MG capsule 1  qhs   1   q day prn 03/14/22   [name], Berneta Sages, MD  Multiple Vitamins-Minerals (MULTIVITAMIN WITH MINERALS) tablet Take 1 tablet by mouth daily.    [provider]  mupirocin ointment (BACTROBAN) 2 % Apply 1 application topically 2 (two) times daily. On left thigh 02/19/20   [provider]  Omega-3 Fatty Acids (FISH OIL) 1000 MG CAPS Take 1 g by mouth daily.    [provider]  PATADAY 0.2 % SOLN Place 1 drop into both eyes daily. 04/04/14   [provider]      Allergies    Penicillins, Haemophilus influenzae vaccines, Mirtazapine, Darvon [propoxyphene], Sulfonamide derivatives, Codeine, and Ivp dye [iodinated contrast media]    Review of Systems   Review of Systems  All other systems reviewed and are negative.   Physical Exam Updated Vital Signs BP (!) 122/93 (BP Location: Right Arm)   Pulse 92   Temp 98.4 F (36.9 C) (Oral)   Resp (!) 22   Ht '5\' 2"'$  (1.575 m)   Wt 75 kg   SpO2 100%   BMI 30.24 kg/m  Physical Exam Vitals and nursing note reviewed.  Constitutional:      General: She is not in acute distress.    Appearance: Normal appearance.  HENT:     Head: Normocephalic and atraumatic.  Eyes:     General:        Right eye: No discharge.        Left eye: No discharge.  Cardiovascular:     Comments: Regular rate and rhythm.  S1/S2 are distinct without any evidence of murmur, rubs, or gallops.  Radial pulses are 2+ bilaterally.  Dorsalis pedis pulses are 2+ bilaterally.  No evidence of pedal edema. Pulmonary:     Comments: Clear to auscultation bilaterally.  Normal effort.  No respiratory distress.  No  evidence of wheezes, rales, or rhonchi heard throughout. Abdominal:     General: Abdomen is flat. Bowel sounds are normal. There is no distension.     Tenderness: There is no abdominal tenderness. There is no guarding or rebound.  Musculoskeletal:        General: Normal range of motion.     Cervical back: Neck supple.  Skin:    General: Skin is warm and dry.     Findings: No rash.  Neurological:     General: No focal deficit present.     Mental Status: She is alert.  Psychiatric:        Mood and Affect: Mood is anxious.        Behavior: Behavior is hyperactive.        Thought Content: Thought content does not include homicidal or suicidal ideation.     ED Results / Procedures / Treatments   Labs (all labs ordered are  listed, but only abnormal results are displayed) Labs Reviewed  COMPREHENSIVE METABOLIC PANEL - Abnormal; Notable for the following components:      Result Value   Sodium 133 (*)    Glucose, Bld 124 (*)    All other components within normal limits  CBC WITH DIFFERENTIAL/PLATELET - Abnormal; Notable for the following components:   Hemoglobin 11.9 (*)    HCT 35.8 (*)    All other components within normal limits  URINALYSIS, ROUTINE W REFLEX MICROSCOPIC - Abnormal; Notable for the following components:   Ketones, ur 5 (*)    All other components within normal limits    EKG None  Radiology CT Head Wo Contrast  Result Date: 03/18/2022 CLINICAL DATA:  Headache, new or worsening (Age >= 50y) headache EXAM: CT HEAD WITHOUT CONTRAST TECHNIQUE: Contiguous axial images were obtained from the base of the skull through the vertex without intravenous contrast. RADIATION DOSE REDUCTION: This exam was performed according to the departmental dose-optimization program which includes automated exposure control, adjustment of the mA and/or kV according to patient size and/or use of iterative reconstruction technique. COMPARISON:  04/19/2014 FINDINGS: Brain: No acute intracranial abnormality. Specifically, no hemorrhage, hydrocephalus, mass lesion, acute infarction, or significant intracranial injury. Vascular: No hyperdense vessel or unexpected calcification. Skull: No acute calvarial abnormality. Sinuses/Orbits: No acute findings Other: None IMPRESSION: No acute intracranial abnormality. Electronically Signed   By: Rolm Baptise M.D.   On: 03/18/2022 17:59   CT ABDOMEN PELVIS WO CONTRAST  Result Date: 03/18/2022 CLINICAL DATA:  Abdominal pain, acute, nonlocalized diffuse abdominal pain EXAM: CT ABDOMEN AND PELVIS WITHOUT CONTRAST TECHNIQUE: Multidetector CT imaging of the abdomen and pelvis was performed following the standard protocol without IV contrast. RADIATION DOSE REDUCTION: This exam was performed  according to the departmental dose-optimization program which includes automated exposure control, adjustment of the mA and/or kV according to patient size and/or use of iterative reconstruction technique. COMPARISON:  None Available. FINDINGS: Lower chest: Linear scarring in the lung bases. No acute abnormality. Hepatobiliary: No focal liver abnormality is seen. Status post cholecystectomy. No biliary dilatation. Pancreas: No focal abnormality or ductal dilatation. Spleen: No focal abnormality.  Normal size. Adrenals/Urinary Tract: No adrenal abnormality. No focal renal abnormality. No stones or hydronephrosis. Urinary bladder is unremarkable. Stomach/Bowel: Stomach, large and small bowel grossly unremarkable. Appendix not visualized. No pericecal inflammation. Vascular/Lymphatic: Aortic atherosclerosis. No evidence of aneurysm or adenopathy. Reproductive: Prior hysterectomy.  No adnexal masses. Other: No free fluid or free air. Musculoskeletal: No acute bony abnormality. IMPRESSION: No acute findings in the  abdomen or pelvis Electronically Signed   By: Rolm Baptise M.D.   On: 03/18/2022 17:58    Procedures Procedures    Medications Ordered in ED Medications  LORazepam (ATIVAN) injection 1 mg (1 mg Intramuscular Given 03/18/22 1615)  cyclobenzaprine (FLEXERIL) tablet 5 mg (5 mg Oral Given 03/18/22 1706)  acetaminophen (TYLENOL) tablet 650 mg (650 mg Oral Given 03/18/22 1725)    ED Course/ Medical Decision Making/ A&P Clinical Course as of 03/18/22 1836  Sat Mar 18, 2022  1810 Comprehensive metabolic panel(!) Evidence of hyponatremia with elevated glucose. [CF]  2800 CBC with Differential(!) Mild anemia.  Remote labs from 2 years ago shows slight increase. [CF]  1811 CT Head Wo Contrast Personally ordered and interpreted this image.  I do not see any evidence of intracranial hemorrhage.  I do agree with the radiologist interpretation. [CF]  1811 CT ABDOMEN PELVIS WO CONTRAST I personally ordered and  interpreted this image.  I do not see any evidence of intracranial abdominal pathology at this time.  I do agree with the radiologist interpretation. [CF]    Clinical Course User Index [CF] Hendricks Limes, PA-C                           Medical Decision Making Maryuri Warnke is a 71 y.o. female patient who presents to the emerged from today for further evaluation of multiple complaints.  Patient seems very anxious during the interview and is bouncing from topic to topic.  She is very concerned about her headache and the new medication that she has been taking which is Cymbalta.  She is also very concerned that she is not can have Xanax for tomorrow.  Difficult to get an adequate history.  But considering that she has had a bowel movement 2 weeks and is complaining of severe headache I will get a CT abdomen pelvis without contrast in addition to a CT head.  We will also get basic labs and test for urine.  I will plan to reassess.  After Ativan, patient is much more calm and able to provide more adequate history.  She is concerned that Cymbalta is not helping and she has been on Xanax for a long time for her anxiety and is concerned that she may go into withdrawal.  On repeat evaluation, patient states that her Xanax arrived at the house.  I went over all labs and imaging with her at the bedside and she was very reassured by this.  Patient wanting to go home.  I think this is a reasonable plan.  I will have her follow-up with her primary care doctor for further evaluation.  Strict return precautions were discussed.  She is safe for discharge at this time.   Amount and/or Complexity of Data Reviewed Labs: ordered. Decision-making details documented in ED Course. Radiology: ordered. Decision-making details documented in ED Course.  Risk OTC drugs. Prescription drug management.    Final Clinical Impression(s) / ED Diagnoses Final diagnoses:  Anxiety  Nonintractable headache, unspecified  chronicity pattern, unspecified headache type    Rx / DC Orders ED Discharge Orders     None         Cherrie Gauze 03/18/22 1840    Teressa Lower, MD 03/21/22 [phone]

## 2022-03-18 NOTE — Discharge Instructions (Signed)
Please follow-up with your primary care doctor to evaluate your current medication.  Please return to the emerge apartment for any worsening symptoms you might have.

## 2022-04-12 ENCOUNTER — Ambulatory Visit (INDEPENDENT_AMBULATORY_CARE_PROVIDER_SITE_OTHER): Payer: Medicare Other | Admitting: Licensed Clinical Social Worker

## 2022-04-12 DIAGNOSIS — F329 Major depressive disorder, single episode, unspecified: Secondary | ICD-10-CM | POA: Diagnosis not present

## 2022-04-12 DIAGNOSIS — F4312 Post-traumatic stress disorder, chronic: Secondary | ICD-10-CM
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
## 2022-05-16 IMAGING — DX DG CHEST 2V
2 series · 2 of 2 positions shown · non-contrast
Comparison: 04/19/2014

CLINICAL DATA: Chest pain, weakness

EXAM:
CHEST - 2 VIEW

[chest lat]
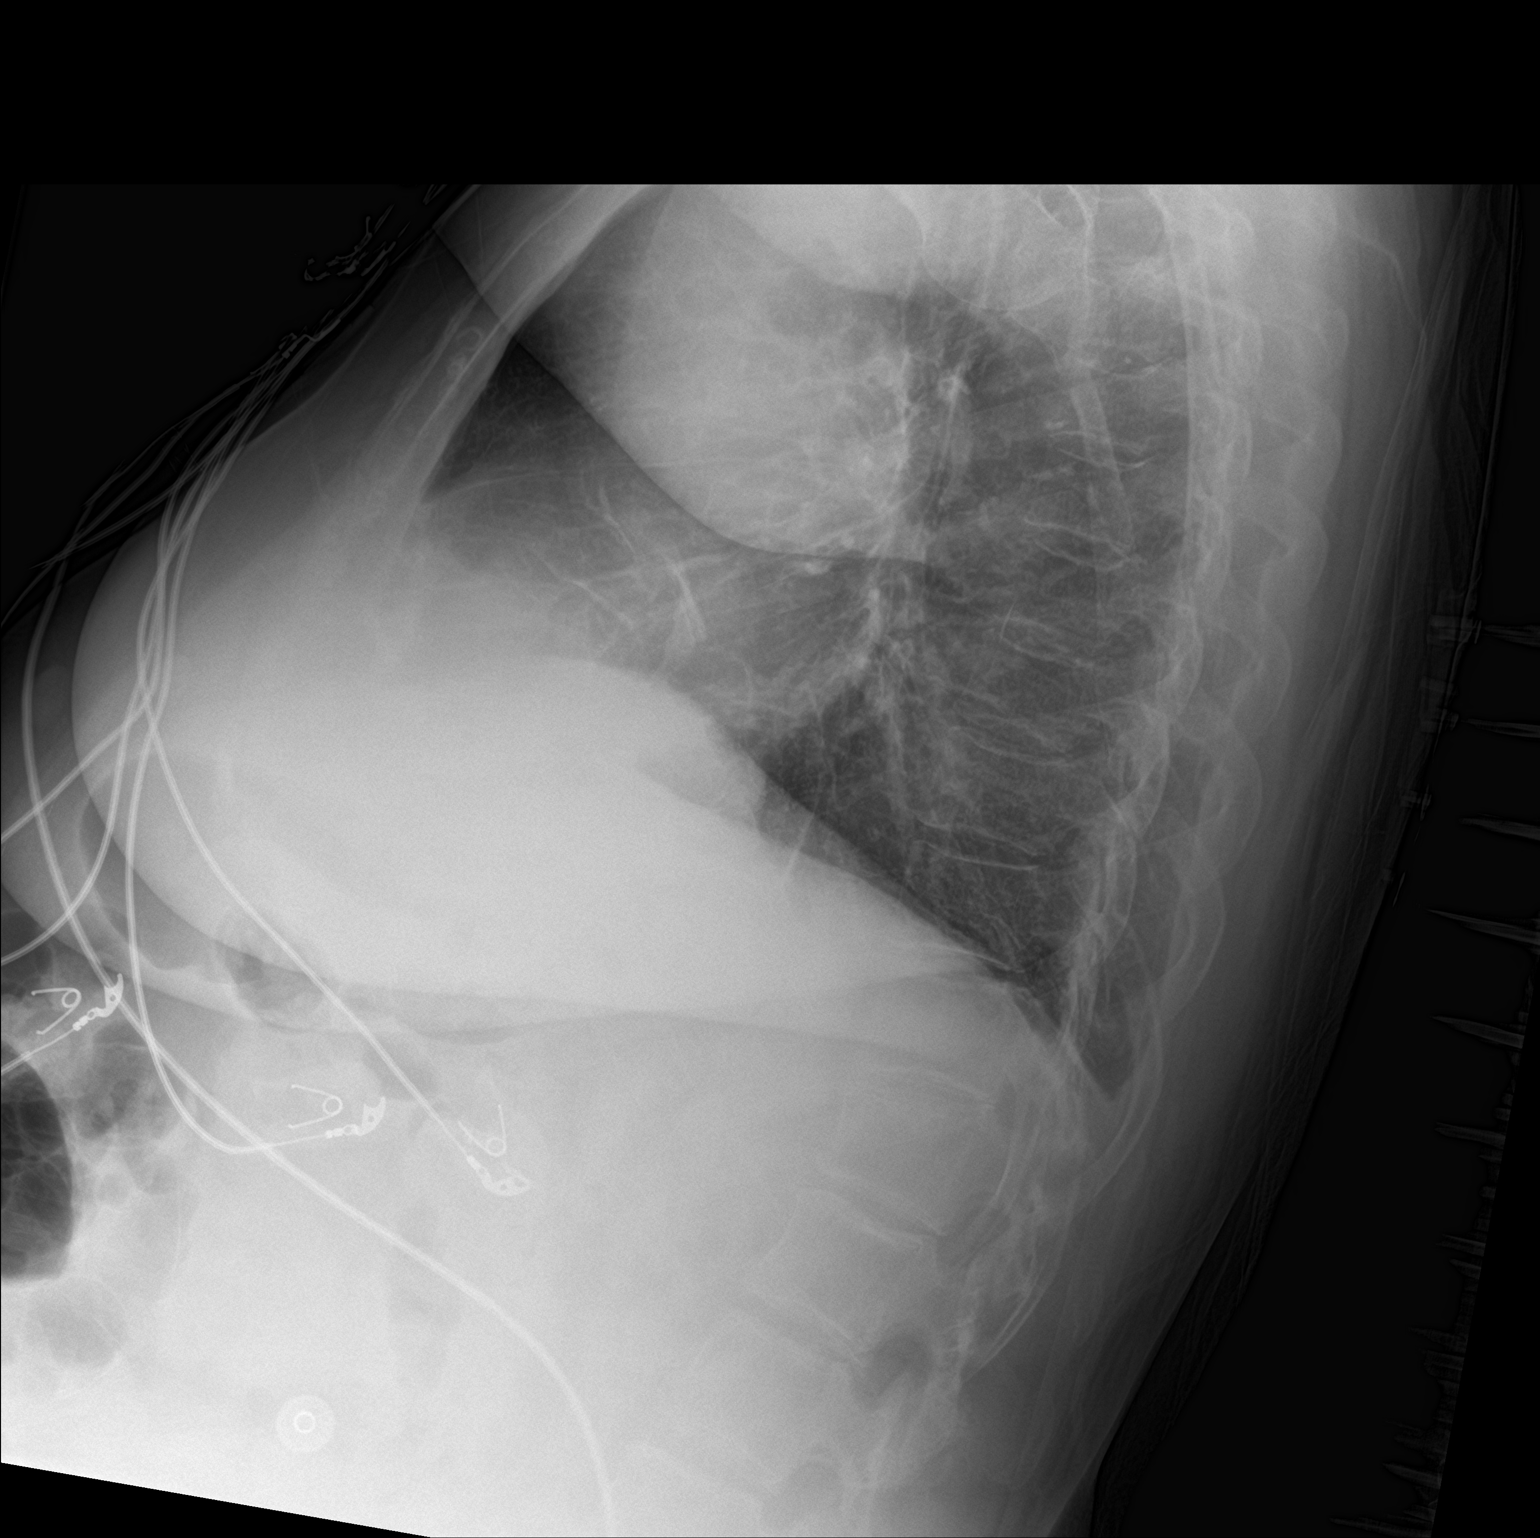

[chest ap]
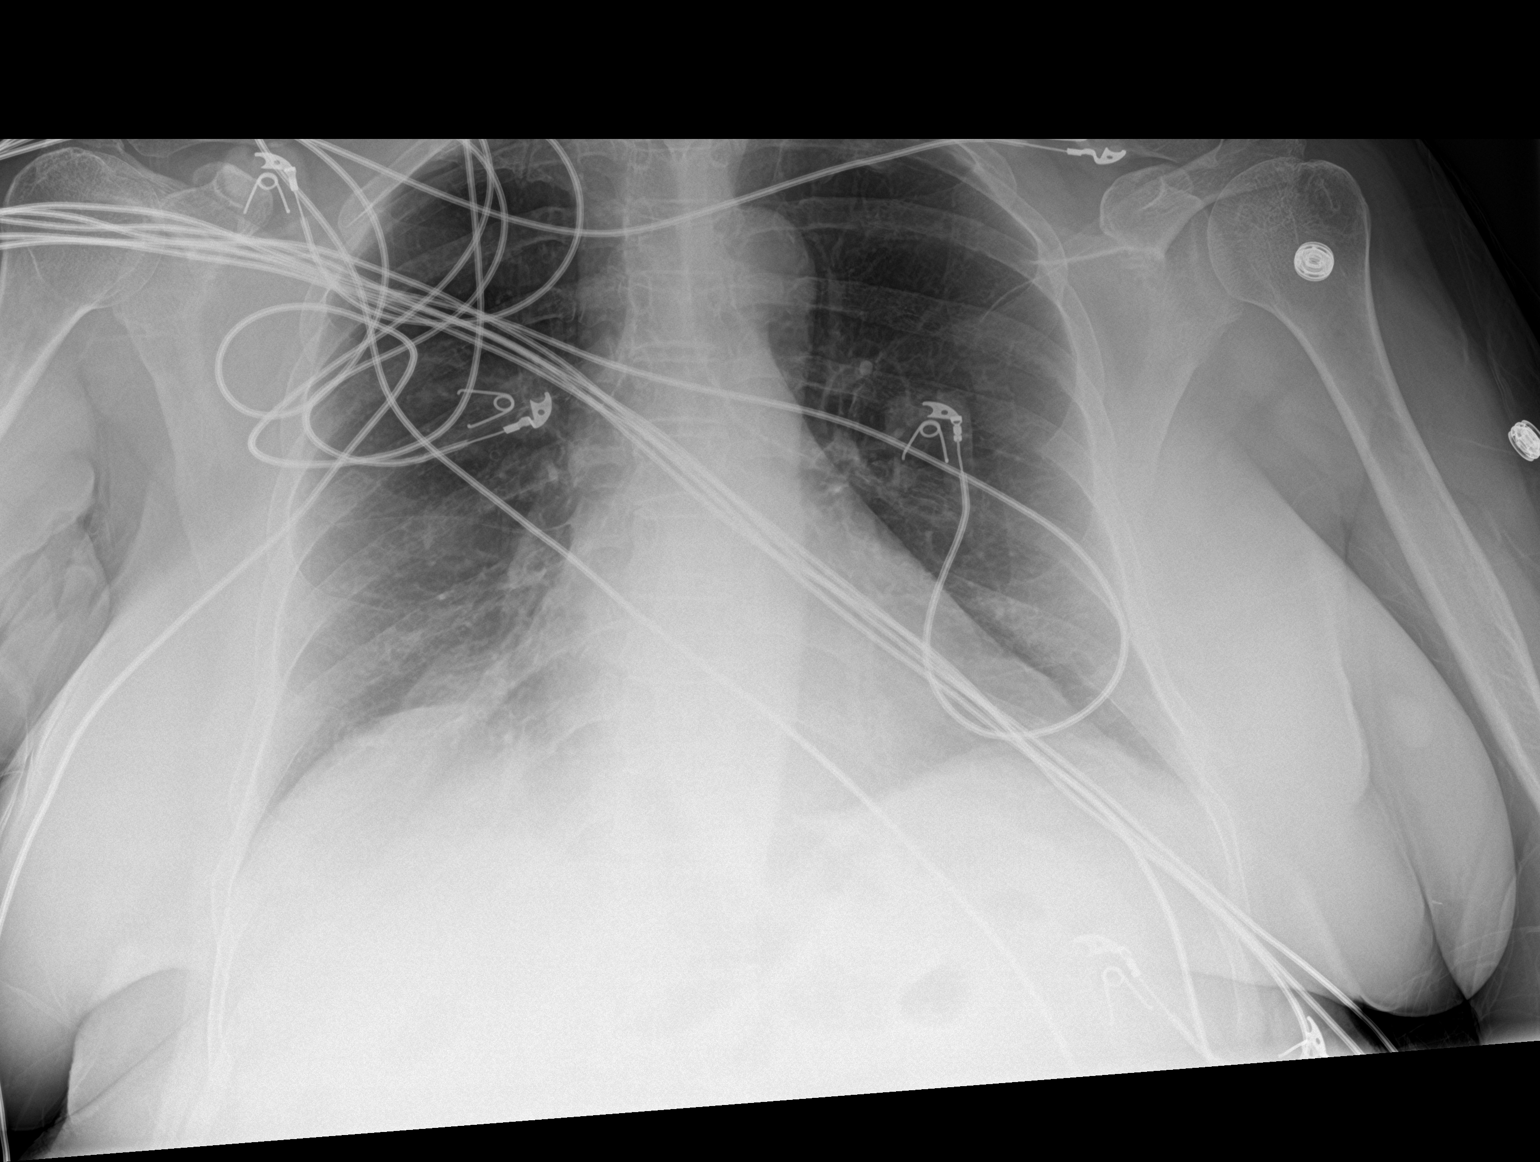

[2 of 2 positions shown; findings below may reference images not displayed]

FINDINGS: Lungs are clear. No pneumothorax or pleural effusion.
Cardiomediastinal silhouette unremarkable. Pulmonary vascularity
normal. No acute bone abnormality.
IMPRESSION: No active cardiopulmonary disease.

## 2022-05-17 ENCOUNTER — Ambulatory Visit (INDEPENDENT_AMBULATORY_CARE_PROVIDER_SITE_OTHER): Payer: Medicare Other | Admitting: Licensed Clinical Social Worker

## 2022-05-17 DIAGNOSIS — F4312 Post-traumatic stress disorder, chronic: Secondary | ICD-10-CM | POA: Diagnosis not present

## 2022-05-17 DIAGNOSIS — F329 Major depressive disorder, single episode, unspecified: Secondary | ICD-10-CM

## 2022-05-17 NOTE — Progress Notes (Signed)
Virtual Visit via Telephone Note  I connected with [name] on 05/17/22 at  1:00 PM EDT by telephone and verified that I am speaking with the correct person using two identifiers.  Location: Patient: home Provider:home office   I discussed the limitations, risks, security and privacy concerns of performing an evaluation and management service by telephone and the availability of in person appointments. I also discussed with the patient that there may be a patient responsible charge related to this service. The patient expressed understanding and agreed to proceed.   I discussed the assessment and treatment plan with the patient. The patient was provided an opportunity to ask questions and all were answered. The patient agreed with the plan and demonstrated an understanding of the instructions.   The patient was advised to call back or seek an in-person evaluation if the symptoms worsen or if the condition fails to improve as anticipated.  I provided 54 minutes of non-face-to-face time during this encounter.  THERAPIST PROGRESS NOTE  Session Time: 1:00 PM to 1:54 PM  Participation Level: Active goal to understand related to rapid speech, lack of clarity of speech, train of thought hard to follow  Behavioral Response: CasualAlertAnxious and Depressed  Type of Therapy: Individual Therapy  Treatment Goals addressed: Patient identifies significant depression to work on to decrease symptoms therapist added other goals as well to continue such as trauma, anxiety, stress management, management of medical issues as they play a significant role in her mental health   ProgressTowards Goals: Progressing-patient describes significant issues assess helpful for her to process feelings and be provided with supportive and strength-based interventions  Interventions: Solution Focused, Strength-based, Supportive, and Other: Being  Summary: Marien Manship is a 71 y.o. female who presents with  problems with medications managing anxiety, isolated and throat trying to manage everything on her own. Feet and hands numb. Problems with kids.  Of note patient with rapid speech hoarse voice very difficult to understand her throughout session tried to grasps basic concepts she related to provide helpful feedback comprehension very limited patient did say she is not feeling well think that affected understanding in session as well.  She says tired, sleepy not eating. Feels doctors not managing her problems well. Patient says alone trying to manage it. Patient shared something about it spreading in eyebrows, ears and scalp.  Unclear what that was.  Also problems seem to center around running out of medications and trying to work with doctors on this but feeling not getting the help she needs. Medical symptoms worse as older. She is distressed but says trying to take it one day at time. Sheryl coming on Friday a friend but in general patient says no help.  Therapist guided patient that it is a priority to get people in to help her patient says she has been trying has dropped off referrals.  Reviewed with patient when this happened she said she talked to the nurse Thayer Jew from Dr. Darcella Cheshire office September 27 said it was in process then patient went on to say that she talked to someone who had not had anything yet.  Therapist encouraged patient to continue to call doctor's office is the best way to get help is through her medical doctor. Dorris had to let go with bed bug incident patient wrote up for it. Patient said doctor who was to make referral for help didn't do anything. Dr. Elicia Lamp PCP. September 27 when Thayer Jew, the nurse at the office, told her made referral.  Therapist  had difficult in  comprehending through session but focus was on working with patient for steps to get more help. She is also going to call Medicare Medicaid therapist provided space and support for patient to talk about symptoms assess helpful  even with limited understanding as patient struggling and needing support and someone to process her feelings with. Will look at what therapist can do to facilitate her getting more help.   Suicidal/Homicidal: No  Plan: 1.patient will call to set up next appointment. 2.  Therapist will look at ways she can help facilitate patient getting more support for her mental health and medical conditions.    Diagnosis: Major depressive Disorder, chronic, PTSD chronic  Collaboration of Care: Other none needed  Patient/Guardian was advised Release of Information must be obtained prior to any record release in order to collaborate their care with an outside provider. Patient/Guardian was advised if they have not already done so to contact the registration department to sign all necessary forms in order for [place] to release information regarding their care.   Consent: Patient/Guardian gives verbal consent for treatment and assignment of benefits for services provided during this visit. Patient/Guardian expressed understanding and agreed to proceed.   [name], LCSW 05/17/2022

## 2022-06-14 ENCOUNTER — Telehealth (HOSPITAL_BASED_OUTPATIENT_CLINIC_OR_DEPARTMENT_OTHER): Payer: Medicare Other | Admitting: Psychiatry

## 2022-06-14 DIAGNOSIS — F324 Major depressive disorder, single episode, in partial remission: Secondary | ICD-10-CM

## 2022-06-14 MED ORDER — HYDROXYZINE PAMOATE 25 MG PO CAPS
ORAL_CAPSULE | ORAL | 5 refills | Status: DC
Start: 2022-06-14 — End: 2022-07-25

## 2022-06-14 MED ORDER — CITALOPRAM HYDROBROMIDE 20 MG PO TABS: ORAL_TABLET | ORAL | 5 refills | Status: DC

## 2022-06-14 MED ORDER — ALPRAZOLAM 0.5 MG PO TABS: ORAL_TABLET | ORAL | 2 refills | Status: DC

## 2022-06-14 NOTE — Progress Notes (Signed)
Psychiatric Initial Adult Assessment   Patient Identification: Carla Tran MRN:  [id number] Date of Evaluation:  06/14/2022 Referral Source: [name] Chief Complaint:   Visit Diagnosis: Major depression chronic  History of Present    Today patient is interviewed by the phone.  She is quite hard to understand.  She speaks quickly and have asked her to repeat herself over again.  Apparently she did not comprehend that we reduced her Xanax from 4-3 and she just about ran out of her medicines.  She therefore went to the emergency room and apparently an adjustment was made but she is now taking Xanax 0.5 mg 3 times daily as I plan.  She will continue taking Celexa 20 mg.  She did not come in that she feels depressed.  She is eating okay.  On her next visit she has agreed to come to see me in person in about 6 weeks.  She will come with her son.  Her plans are to continue her Celexa but probably to start either reducing her Xanax even more.  I do not think she is very reliable.  On the other hand I think this is a low-dose of Xanax approximately 1.5 mg a day.  My plan is as to reduce her Xanax by changing it to a 0.25 mg pill and titrating slowly downhill.  She will continue taking Celexa.  Her next visit we will do a more thorough complete evaluation and review of her chart as well.  She is functioning reasonably well.  She denies any use of alcohol or drugs.  I connected with [name] on 06/14/22 at  3:30 PM EDT by telephone and verified that I am speaking with the correct person using two identifiers.  Location: Patient: home Provider: office   I discussed the limitations, risks, security and privacy concerns of performing an evaluation and management service by telephone and the availability of in person appointments. I also discussed with the patient that there may be a patient responsible charge related to this service. The patient expressed understanding and agreed to  proceed. :    I discussed the assessment and treatment plan with the patient. The patient was provided an opportunity to ask questions and all were answered. The patient agreed with the plan and demonstrated an understanding of the instructions.   The patient was advised to call back or seek an in-person evaluation if the symptoms worsen or if the condition fails to improve as anticipated.  I provided 30 minutes of non-face-to-face time during this encounter.   Jerral Ralph, MD   Depression Symptoms:  disturbed sleep, (Hypo) Manic Symptoms:   Anxiety Symptoms:   Psychotic Symptoms:   PTSD Symptoms:   Past Psychiatric History: Under the care of Dr. Ronnald Ramp and hospitalized for alcohol treatment  Previous Psychotropic Medications: Xanax 0.5 mg 4 times daily, Celexa 40 mg, Seroquel unclear dose  Substance Abuse History in the last 12 months:  Yes.    Consequences of Substance Abuse:   Past Medical History:  Past Medical History:  Diagnosis Date   Allergy    Anxiety    Bursitis    shoulder   Cataract    forming    Cervical cancer (HCC)    Depression    DJD (degenerative joint disease)    Endometriosis    Fibromyalgia    Glaucoma    Head ache    Irregular heart beat    PTSD (post-traumatic stress disorder)    PTSD (post-traumatic  stress disorder)    Seizures (HCC)    x 1 only- in early 2000's and none since    Skin cancer    Stroke [facility])     Past Surgical History:  Procedure Laterality Date   BACK SURGERY     CHOLECYSTECTOMY     COLONOSCOPY     ligaments removed from elbow     PARTIAL HYSTERECTOMY     skin cancer removed     from face x 2, abdomen and arm left    TUBAL LIGATION      Family Psychiatric History:   Family History:  Family History  Problem Relation Age of Onset   Diabetes Mother    Prostate cancer Father    Breast cancer Maternal Aunt    Diabetes Sister    Esophageal cancer Sister    Colon cancer Neg Hx    Rectal cancer Neg Hx     Stomach cancer Neg Hx    Colon polyps Neg Hx     Social History:   Social History   Socioeconomic History   Marital status: Divorced    Spouse name: Not on file   Number of children: 2   Years of education: Not on file   Highest education level: Not on file  Occupational History   Occupation: disabled  Tobacco Use   Smoking status: Never   Smokeless tobacco: Never  Vaping Use   Vaping Use: Never used  Substance and Sexual Activity   Alcohol use: No    Alcohol/week: 0.0 standard drinks of alcohol   Drug use: Yes    Frequency: 1.0 times per week    Types: Marijuana   Sexual activity: Not on file  Other Topics Concern   Not on file  Social History Narrative   Not on file   Social Determinants of Health   Financial Resource Strain: Not on file  Food Insecurity: Not on file  Transportation Needs: Not on file  Physical Activity: Not on file  Stress: Not on file  Social Connections: Not on file    Additional Social History:   Allergies:   Allergies  Allergen Reactions   Penicillins Anaphylaxis   Haemophilus Influenzae Vaccines Rash   Mirtazapine Other (See Comments)    Pt states this medication made her sleep walk.   Darvon [Propoxyphene] Other (See Comments)   Sulfonamide Derivatives Other (See Comments)    Stomach Spasms    Codeine Itching and Rash   Ivp Dye [Iodinated Contrast Media] Rash    Metabolic Disorder Labs: No results found for: "HGBA1C", "MPG" No results found for: "PROLACTIN" No results found for: "CHOL", "TRIG", "HDL", "CHOLHDL", "VLDL", "LDLCALC" Lab Results  Component Value Date   TSH 0.81 11/17/2009    Therapeutic Level Labs: No results found for: "LITHIUM" No results found for: "CBMZ" No results found for: "VALPROATE"  Current Medications: Current Outpatient Medications  Medication Sig Dispense Refill   ALPRAZolam (XANAX) 0.5 MG tablet 1 tid 90 tablet 2   citalopram (CELEXA) 20 MG tablet 1  qam 30 tablet 5   cyclobenzaprine  (FLEXERIL) 10 MG tablet Take 30 mg by mouth at bedtime.      dicyclomine (BENTYL) 10 MG capsule Take 10 mg by mouth 4 (four) times daily.     fluorouracil (EFUDEX) 5 % cream Apply 1 application topically daily. Apply sparingly to left side of face     gabapentin (NEURONTIN) 300 MG capsule Take 300 mg by mouth 2 (two) times daily.  hydrOXYzine (VISTARIL) 25 MG capsule 1  qhs   1   q day prn 35 capsule 5   Multiple Vitamins-Minerals (MULTIVITAMIN WITH MINERALS) tablet Take 1 tablet by mouth daily.     mupirocin ointment (BACTROBAN) 2 % Apply 1 application topically 2 (two) times daily. On left thigh     Omega-3 Fatty Acids (FISH OIL) 1000 MG CAPS Take 1 g by mouth daily.     PATADAY 0.2 % SOLN Place 1 drop into both eyes daily.     No current facility-administered medications for this visit.    Musculoskeletal: Strength & Muscle Tone: Normal gait & Station: normal Patient leans: N/A  Psychiatric Specialty Exam: ROS  There were no vitals taken for this visit.There is no height or weight on file to calculate BMI.  General Appearance: Casual  Eye Contact:  Good  Speech:  Clear and Coherent  Volume:  Normal  Mood:  Negative  Affect:  Congruent  Thought Process:  Goal Directed  Orientation:  Full (Time, Place, and Person)  Thought Content:  WDL  Suicidal Thoughts:  No  Homicidal Thoughts:  No  Memory:  Negative  Judgement:  Fair  Insight:  Fair  Psychomotor Activity:  Normal  Concentration:    Recall:  Poor  Fund of Knowledge:Poor  Language: Fair  Akathisia:  No  Handed:  Right  AIMS (if indicated):  not done  Assets:  Desire for Improvement  ADL's:    Cognition: Impaired,  Mild  Sleep:  Fair   Screenings: Flowsheet Row ED from 03/18/2022 in [place] No Risk       Assessment and Plan: 06/14/2022  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  She will continue taking 20 mg of Celexa.  She apparently has been  on Xanax for decades.  She takes 0.5 mg 3 times daily.  She also takes Vistaril 25 mg at night and 1 as needed.  We will make an attempt to continue slowly titrating her off her Xanax and be very clear about what were doing.  Will give it to her in writing.  Given that she is 71 years old of age my plan is to ultimately get her off Xanax completely.  I suspect we will do it by giving her very low doses of 0.25 mg as well as increasing her Vistaril.  She will be seen again in 6 weeks with her son.

## 2022-07-19 ENCOUNTER — Ambulatory Visit (INDEPENDENT_AMBULATORY_CARE_PROVIDER_SITE_OTHER): Payer: Medicare Other | Admitting: Licensed Clinical Social Worker

## 2022-07-19 DIAGNOSIS — F329 Major depressive disorder, single episode, unspecified: Secondary | ICD-10-CM | POA: Diagnosis not present

## 2022-07-19 DIAGNOSIS — F4312 Post-traumatic stress disorder, chronic: Secondary | ICD-10-CM
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
## 2022-07-25 ENCOUNTER — Ambulatory Visit (HOSPITAL_BASED_OUTPATIENT_CLINIC_OR_DEPARTMENT_OTHER): Payer: Medicare Other | Admitting: Psychiatry

## 2022-07-25 DIAGNOSIS — F324 Major depressive disorder, single episode, in partial remission: Secondary | ICD-10-CM

## 2022-07-25 MED ORDER — HYDROXYZINE PAMOATE 25 MG PO CAPS
ORAL_CAPSULE | ORAL | 5 refills | Status: DC
Start: 2022-07-25 — End: 2022-11-08

## 2022-07-25 MED ORDER — ALPRAZOLAM 0.25 MG PO TABS: ORAL_TABLET | ORAL | 3 refills | Status: DC

## 2022-07-25 MED ORDER — CITALOPRAM HYDROBROMIDE 20 MG PO TABS: ORAL_TABLET | ORAL | 5 refills | Status: DC

## 2022-07-25 NOTE — Progress Notes (Signed)
Psychiatric Initial Adult Assessment   Patient Identification: Carla Tran MRN:  [id number] Date of Evaluation:  07/25/2022 Referral Source: Dr. Derrek Tran Chief Complaint:   Visit Diagnosis: Major depression chronic  History of Present     Today the patient is seen in the office.  She is seen by herself.  She did not bring her son but that seems to be okay.  Her son's name is Carla Tran and he is a chronic alcoholic and not reliable.  The patient lives alone.  She is relatively isolated lifestyle.  She watches some TV but not much more.  She has no romance.  She is only a few friends.  Today the patient denies daily depression.  She is actually sleeping and eating pretty well.  She is got good energy.  She denies symptoms consistent with generalized anxiety disorder, panic disorder or obsessive-compulsive disorder.  In the last few months she has had problems with her Xanax.  She has missed taking some of it and gets it mixed up with her Flexeril.  Her primary care setting gives her Flexeril 10 mg 3 times daily.  At this time he had actually taken over Xanax.  He was not all that clear the previous notes but at this time our office our clinic will be prescribing her Xanax.  Today she agreed to start reducing and tapering off her Xanax.  We will go slow.  The patient denies use of alcohol or drugs.  She has a distant history of alcoholism which seems to be has abated.  The patient has a past psychiatric history she made a suicide attempt at age 71.  She was in the psychiatric hospital 30 years ago for alcohol abuse.  She has been free of hospitalization since then.  Fortunately she is in therapy with one of her therapist and is connected well.  I connected with Carla Tran on 07/25/22 at  2:00 PM EST by telephone and verified that I am speaking with the correct person using two identifiers.  Location: Patient: home Provider: office   I discussed the limitations, risks, security and privacy  concerns of performing an evaluation and management service by telephone and the availability of in person appointments. I also discussed with the patient that there may be a patient responsible charge related to this service. The patient expressed understanding and agreed to proceed. :    I discussed the assessment and treatment plan with the patient. The patient was provided an opportunity to ask questions and all were answered. The patient agreed with the plan and demonstrated an understanding of the instructions.   The patient was advised to call back or seek an in-person evaluation if the symptoms worsen or if the condition fails to improve as anticipated.  I provided 30 minutes of non-face-to-face time during this encounter.   Carla Ralph, MD   Depression Symptoms:  disturbed sleep, (Hypo) Manic Symptoms:   Anxiety Symptoms:   Psychotic Symptoms:   PTSD Symptoms:   Past Psychiatric History: Under the care of Dr. Ronnald Tran and hospitalized for alcohol treatment  Previous Psychotropic Medications: Xanax 0.5 mg 4 times daily, Celexa 40 mg, Seroquel unclear dose  Substance Abuse History in the last 12 months:  Yes.    Consequences of Substance Abuse:   Past Medical History:  Past Medical History:  Diagnosis Date   Allergy    Anxiety    Bursitis    shoulder   Cataract    forming    Cervical cancer (  [place])    Depression    DJD (degenerative joint disease)    Endometriosis    Fibromyalgia    Glaucoma    Head ache    Irregular heart beat    PTSD (post-traumatic stress disorder)    PTSD (post-traumatic stress disorder)    Seizures ([place])    x 1 only- in early 2000's and none since    Skin cancer    Stroke [facility])     Past Surgical History:  Procedure Laterality Date   BACK SURGERY     CHOLECYSTECTOMY     COLONOSCOPY     ligaments removed from elbow     PARTIAL HYSTERECTOMY     skin cancer removed     from face x 2, abdomen and arm left    TUBAL LIGATION       Family Psychiatric History:   Family History:  Family History  Problem Relation Age of Onset   Diabetes Mother    Prostate cancer Father    Breast cancer Maternal Aunt    Diabetes Sister    Esophageal cancer Sister    Colon cancer Neg Hx    Rectal cancer Neg Hx    Stomach cancer Neg Hx    Colon polyps Neg Hx     Social History:   Social History   Socioeconomic History   Marital status: Divorced    Spouse name: Not on file   Number of children: 2   Years of education: Not on file   Highest education level: Not on file  Occupational History   Occupation: disabled  Tobacco Use   Smoking status: Never   Smokeless tobacco: Never  Vaping Use   Vaping Use: Never used  Substance and Sexual Activity   Alcohol use: No    Alcohol/week: 0.0 standard drinks of alcohol   Drug use: Yes    Frequency: 1.0 times per week    Types: Marijuana   Sexual activity: Not on file  Other Topics Concern   Not on file  Social History Narrative   Not on file   Social Determinants of Health   Financial Resource Strain: Not on file  Food Insecurity: Not on file  Transportation Needs: Not on file  Physical Activity: Not on file  Stress: Not on file  Social Connections: Not on file    Additional Social History:   Allergies:   Allergies  Allergen Reactions   Penicillins Anaphylaxis   Haemophilus Influenzae Vaccines Rash   Mirtazapine Other (See Comments)    Pt states this medication made her sleep walk.   Darvon [Propoxyphene] Other (See Comments)   Sulfonamide Derivatives Other (See Comments)    Stomach Spasms    Codeine Itching and Rash   Ivp Dye [Iodinated Contrast Media] Rash    Metabolic Disorder Labs: No results found for: "HGBA1C", "MPG" No results found for: "PROLACTIN" No results found for: "CHOL", "TRIG", "HDL", "CHOLHDL", "VLDL", "LDLCALC" Lab Results  Component Value Date   TSH 0.81 11/17/2009    Therapeutic Level Labs: No results found for: "LITHIUM" No  results found for: "CBMZ" No results found for: "VALPROATE"  Current Medications: Current Outpatient Medications  Medication Sig Dispense Refill   ALPRAZolam (XANAX) 0.25 MG tablet 2 qam    1   qnoon      1 qhs 120 tablet 3   citalopram (CELEXA) 20 MG tablet 1  qam 30 tablet 5   cyclobenzaprine (FLEXERIL) 10 MG tablet Take 30 mg by mouth at bedtime.  dicyclomine (BENTYL) 10 MG capsule Take 10 mg by mouth 4 (four) times daily.     fluorouracil (EFUDEX) 5 % cream Apply 1 application topically daily. Apply sparingly to left side of face     gabapentin (NEURONTIN) 300 MG capsule Take 300 mg by mouth 2 (two) times daily.     hydrOXYzine (VISTARIL) 25 MG capsule 1  qhs   1   q day prn 35 capsule 5   Multiple Vitamins-Minerals (MULTIVITAMIN WITH MINERALS) tablet Take 1 tablet by mouth daily.     mupirocin ointment (BACTROBAN) 2 % Apply 1 application topically 2 (two) times daily. On left thigh     Omega-3 Fatty Acids (FISH OIL) 1000 MG CAPS Take 1 g by mouth daily.     PATADAY 0.2 % SOLN Place 1 drop into both eyes daily.     No current facility-administered medications for this visit.    Musculoskeletal: Strength & Muscle Tone: Normal gait & Station: normal Patient leans: N/A  Psychiatric Specialty Exam: ROS  There were no vitals taken for this visit.There is no height or weight on file to calculate BMI.  General Appearance: Casual  Eye Contact:  Good  Speech:  Clear and Coherent  Volume:  Normal  Mood:  Negative  Affect:  Congruent  Thought Process:  Goal Directed  Orientation:  Full (Time, Place, and Person)  Thought Content:  WDL  Suicidal Thoughts:  No  Homicidal Thoughts:  No  Memory:  Negative  Judgement:  Fair  Insight:  Fair  Psychomotor Activity:  Normal  Concentration:    Recall:  Poor  Fund of Knowledge:Poor  Language: Fair  Akathisia:  No  Handed:  Right  AIMS (if indicated):  not done  Assets:  Desire for Improvement  ADL's:    Cognition: Impaired,  Mild   Sleep:  Fair   Screenings: Flowsheet Row ED from 03/18/2022 in [place] No Risk       Assessment and Plan: 07/25/2022   This patient has a past history of major depression.  She will continue taking Celexa 20 mg for this.  She will continue in one-to-one psychotherapy in our clinic.  The patient has an adjustment disorder with an anxious mood state.  We have no evidence of a chronic anxiety disorder.  She has been on Xanax for well over a decade.  We will slowly start tapering off by giving her Xanax 0.25 mg 2 in the morning 1 midday and 1 at night.  Her pharmacy was contacted and told them of this change.  She will start of the new Xanax protocol after she runs out of the Xanax that she is already on.  Therefore she will start this new Xanax protocol probably in about 3 weeks.  She will then have refills and return to see me in approximately 3 months.  She will also continue taking Vistaril 25 mg at night for sleep.  I think the patient is reasonably stable and she is actually agreeable to this slow titration off of Xanax.

## 2022-08-17 ENCOUNTER — Ambulatory Visit (INDEPENDENT_AMBULATORY_CARE_PROVIDER_SITE_OTHER): Payer: Medicare Other | Admitting: Licensed Clinical Social Worker

## 2022-08-17 DIAGNOSIS — F4312 Post-traumatic stress disorder, chronic: Secondary | ICD-10-CM | POA: Diagnosis not present

## 2022-08-17 DIAGNOSIS — F324 Major depressive disorder, single episode, in partial remission: Secondary | ICD-10-CM | POA: Diagnosis not present

## 2022-08-17 DIAGNOSIS — F329 Major depressive disorder, single episode, unspecified: Secondary | ICD-10-CM

## 2022-08-17 NOTE — Progress Notes (Signed)
Virtual Visit via Video Note  I connected with Carla Tran on 08/17/22 at  1:00 PM EST by a video enabled telemedicine application and verified that I am speaking with the correct person using two identifiers.  Location: Patient: In car but was not driving Provider: home office   I discussed the limitations of evaluation and management by telemedicine and the availability of in person appointments. The patient expressed understanding and agreed to proceed.   I discussed the assessment and treatment plan with the patient. The patient was provided an opportunity to ask questions and all were answered. The patient agreed with the plan and demonstrated an understanding of the instructions.   The patient was advised to call back or seek an in-person evaluation if the symptoms worsen or if the condition fails to improve as anticipated.  I provided 20 minutes of non-face-to-face time during this encounter.  THERAPIST PROGRESS NOTE  Session Time: 1:00 PM to 1:20 PM  Participation Level: Active  Behavioral Response: CasualAlertAnxious  Type of Therapy: Individual Therapy  Treatment Goals addressed: Patient identifies significant depression to work on to decrease symptoms therapist added other goals as well to continue such as trauma, anxiety, stress management, management of medical issues as they play a significant role in her mental health   ProgressTowards Goals: Progressing-patient using therapy therapeutically to address issues of note today emergency visit with Dr. And still wanted to talk to therapist to update her as to stressors  Interventions: Solution Focused, Strength-based, Supportive, and Other: coping  Summary: Carla Tran is a 72 y.o. female who presents with has emergency biopsy today with dermatologist something is spreading.  Patient does not know what it is also concerned about hair falling out.In the car and her friend is helping her with the situation also helping  get her medications.patient briefly mention other issues was alone Christmas fortunate she did not pick up but she was strong stresses about her kids returning to telling therapist about what spreading is in her hair and her ears looks like baby powder.  Developed numbness in her hands and feet patient looked up that looks like neuropathy.  Talked about a positive visit with Dr. And therapist had the same impression after reading the note it was a good visit where good communication was happening.  Patient wants therapist to talk to doctor about ADHD no medicine for it.  Therapist encouraged patient to talk her self as well to doctor about this.  Of note during session she is rapid speech tension focus going from 1 subject to the other.  Another subject brought up was patient had only taken 1 Xanax today and again having problems with not getting the right amount of this medication describing is run out.  Kept the visit short as patient is scattered in midst of doing errands on the way for emergency procedure therapist assesses patient wanted a brief session as therapy is supportive for her at able to have an outlet to talk about feelings to help with stressors.  Suicidal/Homicidal: No  Plan: 1.reschedule to use therapy to talk about what is going on process stressors this session Short as she was in the middle of errands an emergency visit with doctor. .  Diagnosis: Major depressive disorder chronic,  PTSD chronic  Collaboration of Care: Medication Management AEB Dr. Casimiro Needle  Patient/Guardian was advised Release of Information must be obtained prior to any record release in order to collaborate their care with an outside provider. Patient/Guardian was advised if they  have not already done so to contact the registration department to sign all necessary forms in order for [place] to release information regarding their care.   Consent: Patient/Guardian gives verbal consent for treatment and assignment of  benefits for services provided during this visit. Patient/Guardian expressed understanding and agreed to proceed.   [name], LCSW 08/17/2022

## 2022-09-11 ENCOUNTER — Ambulatory Visit (HOSPITAL_COMMUNITY): Payer: 59 | Admitting: Licensed Clinical Social Worker

## 2022-09-11 ENCOUNTER — Encounter (HOSPITAL_COMMUNITY): Payer: Self-pay

## 2022-09-11 NOTE — Progress Notes (Signed)
Therapist contacted patient by text for session and she did not show 

## 2022-10-10 ENCOUNTER — Ambulatory Visit (HOSPITAL_COMMUNITY): Payer: 59 | Admitting: Licensed Clinical Social Worker

## 2022-10-23 ENCOUNTER — Ambulatory Visit (INDEPENDENT_AMBULATORY_CARE_PROVIDER_SITE_OTHER): Payer: 59 | Admitting: Licensed Clinical Social Worker

## 2022-10-23 DIAGNOSIS — F329 Major depressive disorder, single episode, unspecified: Secondary | ICD-10-CM

## 2022-10-23 DIAGNOSIS — F4312 Post-traumatic stress disorder, chronic: Secondary | ICD-10-CM | POA: Diagnosis not present
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
## 2022-10-25 ENCOUNTER — Ambulatory Visit (HOSPITAL_COMMUNITY): Payer: Medicare Other | Admitting: Psychiatry

## 2022-11-08 ENCOUNTER — Ambulatory Visit (HOSPITAL_BASED_OUTPATIENT_CLINIC_OR_DEPARTMENT_OTHER): Payer: 59 | Admitting: Psychiatry

## 2022-11-08 VITALS — BP 149/81 | HR 81 | Ht 62.0 in | Wt 160.0 lb

## 2022-11-08 DIAGNOSIS — F4323 Adjustment disorder with mixed anxiety and depressed mood: Secondary | ICD-10-CM

## 2022-11-08 MED ORDER — HYDROXYZINE PAMOATE 25 MG PO CAPS
ORAL_CAPSULE | ORAL | 5 refills | Status: DC
Start: 2022-11-08 — End: 2023-01-16

## 2022-11-08 MED ORDER — CITALOPRAM HYDROBROMIDE 20 MG PO TABS
ORAL_TABLET | ORAL | 5 refills | Status: DC
Start: 2022-11-08 — End: 2023-01-18

## 2022-11-08 MED ORDER — ALPRAZOLAM 0.25 MG PO TABS: ORAL_TABLET | ORAL | 3 refills | Status: DC

## 2022-11-08 MED ORDER — GABAPENTIN 300 MG PO CAPS
ORAL_CAPSULE | ORAL | 4 refills | Status: DC
Start: 2022-11-08 — End: 2023-01-16

## 2022-11-08 MED ORDER — ALPRAZOLAM 0.25 MG PO TABS
ORAL_TABLET | ORAL | 3 refills | Status: DC
Start: 2022-11-08 — End: 2023-01-16
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
## 2022-12-11 ENCOUNTER — Ambulatory Visit (INDEPENDENT_AMBULATORY_CARE_PROVIDER_SITE_OTHER): Payer: 59 | Admitting: Licensed Clinical Social Worker

## 2022-12-11 DIAGNOSIS — F4312 Post-traumatic stress disorder, chronic: Secondary | ICD-10-CM

## 2022-12-11 DIAGNOSIS — F329 Major depressive disorder, single episode, unspecified: Secondary | ICD-10-CM

## 2022-12-11 DIAGNOSIS — F4323 Adjustment disorder with mixed anxiety and depressed mood: Secondary | ICD-10-CM

## 2022-12-11 NOTE — Progress Notes (Signed)
Patient late cancel appointment 

## 2023-01-16 ENCOUNTER — Encounter (HOSPITAL_COMMUNITY): Payer: Self-pay | Admitting: Psychiatry

## 2023-01-16 ENCOUNTER — Other Ambulatory Visit: Payer: Self-pay

## 2023-01-16 ENCOUNTER — Ambulatory Visit (HOSPITAL_BASED_OUTPATIENT_CLINIC_OR_DEPARTMENT_OTHER): Payer: 59 | Admitting: Psychiatry

## 2023-01-16 VITALS — BP 118/72 | HR 86 | Ht 62.0 in | Wt 160.0 lb

## 2023-01-16 DIAGNOSIS — F329 Major depressive disorder, single episode, unspecified: Secondary | ICD-10-CM | POA: Diagnosis not present

## 2023-01-16 MED ORDER — ALPRAZOLAM 0.25 MG PO TABS: ORAL_TABLET | ORAL | 3 refills | Status: DC

## 2023-01-16 MED ORDER — ESCITALOPRAM OXALATE 10 MG PO TABS: 10.0000 mg | ORAL_TABLET | Freq: Every day | ORAL | 3 refills | Status: DC

## 2023-01-16 MED ORDER — GABAPENTIN 300 MG PO CAPS: ORAL_CAPSULE | ORAL | 4 refills | Status: DC

## 2023-01-16 MED ORDER — HYDROXYZINE PAMOATE 25 MG PO CAPS
ORAL_CAPSULE | ORAL | 5 refills | Status: DC
Start: 2023-01-16 — End: 2023-04-18

## 2023-01-16 NOTE — Progress Notes (Signed)
Psychiatric Initial Adult Assessment   Patient Identification: Carla Tran MRN:  [id number] Date of Evaluation:  01/16/2023 Referral Source: Dr. Shawn Stall Chief Complaint:    Visit Diagnosis: Major depression chronic  History of Present    Today the patient is actually doing fairly well.  She has some medical problems.  She continues in therapy.  Today once again we clarified her medication treatment.  For now she will continue taking very low-dose of Xanax 0.25 mg 1 3 times daily.  The patient complains of difficulty falling asleep.  However she has no real sleep dysfunction the next day.  She is not taking naps and she does not really feel all that tired.  Today we will increase her Vistaril to taking 1 in the morning and 2 at night.  The patient has 2 adult children.  Apparently her son [name] is alcohol dependent.  This patient has had an alcohol issue but has been sober for 18 years.  The patient is very proud that she has been so free of alcohol.  In close evaluation the patient clearly can describe symptoms consistent with generalized anxiety disorder.  She is a chronic worrier is always irritable has musculoskeletal distress and has great problems sleeping.  She has been on Xanax for decades.    I connected with Carla Tran on 01/16/23 at  2:00 PM EDT by telephone and verified that I am speaking with the correct person using two identifiers.  Location: Patient: home Provider: office   I discussed the limitations, risks, security and privacy concerns of performing an evaluation and management service by telephone and the availability of in person appointments. I also discussed with the patient that there may be a patient responsible charge related to this service. The patient expressed understanding and agreed to proceed. :    I discussed the assessment and treatment plan with the patient. The patient was provided an opportunity to ask questions and all were answered. The patient  agreed with the plan and demonstrated an understanding of the instructions.   The patient was advised to call back or seek an in-person evaluation if the symptoms worsen or if the condition fails to improve as anticipated.  I provided 30 minutes of non-face-to-face time during this encounter.   [name], MD   Depression Symptoms:  disturbed sleep, (Hypo) Manic Symptoms:   Anxiety Symptoms:   Psychotic Symptoms:   PTSD Symptoms:   Past Psychiatric History: Under the care of Dr. Yetta Barre and hospitalized for alcohol treatment  Previous Psychotropic Medications: Xanax 0.5 mg 4 times daily, Celexa 40 mg, Seroquel unclear dose  Substance Abuse History in the last 12 months:  Yes.    Consequences of Substance Abuse:   Past Medical History:  Past Medical History:  Diagnosis Date   Allergy    Anxiety    Bursitis    shoulder   Cataract    forming    Cervical cancer (HCC)    Depression    DJD (degenerative joint disease)    Endometriosis    Fibromyalgia    Glaucoma    Head ache    Irregular heart beat    PTSD (post-traumatic stress disorder)    PTSD (post-traumatic stress disorder)    Seizures (HCC)    x 1 only- in early 2000's and none since    Skin cancer    Stroke [facility])     Past Surgical History:  Procedure Laterality Date   BACK SURGERY     CHOLECYSTECTOMY  COLONOSCOPY     ligaments removed from elbow     PARTIAL HYSTERECTOMY     skin cancer removed     from face x 2, abdomen and arm left    TUBAL LIGATION      Family Psychiatric History:   Family History:  Family History  Problem Relation Age of Onset   Diabetes Mother    Prostate cancer Father    Breast cancer Maternal Aunt    Diabetes Sister    Esophageal cancer Sister    Colon cancer Neg Hx    Rectal cancer Neg Hx    Stomach cancer Neg Hx    Colon polyps Neg Hx     Social History:   Social History   Socioeconomic History   Marital status: Divorced    Spouse name: Not on file    Number of children: 2   Years of education: Not on file   Highest education level: Not on file  Occupational History   Occupation: disabled  Tobacco Use   Smoking status: Never   Smokeless tobacco: Never  Vaping Use   Vaping Use: Never used  Substance and Sexual Activity   Alcohol use: No    Alcohol/week: 0.0 standard drinks of alcohol   Drug use: Yes    Frequency: 1.0 times per week    Types: Marijuana   Sexual activity: Not on file  Other Topics Concern   Not on file  Social History Narrative   Not on file   Social Determinants of Health   Financial Resource Strain: Not on file  Food Insecurity: Not on file  Transportation Needs: Not on file  Physical Activity: Not on file  Stress: Not on file  Social Connections: Not on file    Additional Social History:   Allergies:   Allergies  Allergen Reactions   Penicillins Anaphylaxis   Haemophilus Influenzae Vaccines Rash   Mirtazapine Other (See Comments)    Pt states this medication made her sleep walk.   Darvon [Propoxyphene] Other (See Comments)   Sulfonamide Derivatives Other (See Comments)    Stomach Spasms    Codeine Itching and Rash   Ivp Dye [Iodinated Contrast Media] Rash    Metabolic Disorder Labs: No results found for: "HGBA1C", "MPG" No results found for: "PROLACTIN" No results found for: "CHOL", "TRIG", "HDL", "CHOLHDL", "VLDL", "LDLCALC" Lab Results  Component Value Date   TSH 0.81 11/17/2009    Therapeutic Level Labs: No results found for: "LITHIUM" No results found for: "CBMZ" No results found for: "VALPROATE"  Current Medications: Current Outpatient Medications  Medication Sig Dispense Refill   citalopram (CELEXA) 20 MG tablet 1  qam 30 tablet 5   cyclobenzaprine (FLEXERIL) 10 MG tablet Take 30 mg by mouth at bedtime.      escitalopram (LEXAPRO) 10 MG tablet Take 1 tablet (10 mg total) by mouth daily. 30 tablet 3   Multiple Vitamins-Minerals (MULTIVITAMIN WITH MINERALS) tablet Take 1  tablet by mouth daily.     mupirocin ointment (BACTROBAN) 2 % Apply 1 application topically 2 (two) times daily. On left thigh     Omega-3 Fatty Acids (FISH OIL) 1000 MG CAPS Take 1 g by mouth daily.     PATADAY 0.2 % SOLN Place 1 drop into both eyes daily.     ALPRAZolam (XANAX) 0.25 MG tablet 1 TID 90 tablet 3   dicyclomine (BENTYL) 10 MG capsule Take 10 mg by mouth 4 (four) times daily. (Patient not taking: Reported on 01/16/2023)  fluorouracil (EFUDEX) 5 % cream Apply 1 application topically daily. Apply sparingly to left side of face (Patient not taking: Reported on 01/16/2023)     gabapentin (NEURONTIN) 300 MG capsule 1 qam  1 q midday 2 qhs 120 capsule 4   hydrOXYzine (VISTARIL) 25 MG capsule 1 qam  2  qhs 90 capsule 5   No current facility-administered medications for this visit.    Musculoskeletal: Strength & Muscle Tone: Normal gait & Station: normal Patient leans: N/A  Psychiatric Specialty Exam: ROS  Blood pressure 118/72, pulse 86, height 5\' 2"  (1.575 m), weight 160 lb (72.6 kg).Body mass index is 29.26 kg/m.  General Appearance: Casual  Eye Contact:  Good  Speech:  Clear and Coherent  Volume:  Normal  Mood:  Negative  Affect:  Congruent  Thought Process:  Goal Directed  Orientation:  Full (Time, Place, and Person)  Thought Content:  WDL  Suicidal Thoughts:  No  Homicidal Thoughts:  No  Memory:  Negative  Judgement:  Fair  Insight:  Fair  Psychomotor Activity:  Normal  Concentration:    Recall:  Poor  Fund of Knowledge:Poor  Language: Fair  Akathisia:  No  Handed:  Right  AIMS (if indicated):  not done  Assets:  Desire for Improvement  ADL's:    Cognition: Impaired,  Mild  Sleep:  Fair   Screenings: Flowsheet Row ED from 03/18/2022 in [facility] Emergency Department at [facility]  C-SSRS RISK CATEGORY No Risk       Assessment and Plan: 01/16/2023   This patient's diagnosis is that of major depression and probable also generalized anxiety  disorder.  Today we are going to discontinue her Celexa and change her to 10 mg of Lexapro.  For now she will continue taking Xanax 0.25 mg 3 times daily.  We will increase her Vistaril to taking 1 in the morning and 2 at night.  She will continue taking gabapentin 300 mg 1 in the morning 1 midday and 2 at night.  The patient will continue in one-to-one therapy.  This patient be seen again in 3 months.  At that time we will consider increasing her Lexapro and possibly reducing further her Xanax but I had no hurry to change her Xanax.  The patient is free of alcohol and drugs.  She takes her medicines very appropriately.

## 2023-01-18 ENCOUNTER — Other Ambulatory Visit (HOSPITAL_COMMUNITY): Payer: Self-pay | Admitting: *Deleted

## 2023-01-23 ENCOUNTER — Ambulatory Visit (INDEPENDENT_AMBULATORY_CARE_PROVIDER_SITE_OTHER): Payer: 59 | Admitting: Licensed Clinical Social Worker

## 2023-01-23 DIAGNOSIS — F329 Major depressive disorder, single episode, unspecified: Secondary | ICD-10-CM

## 2023-01-23 DIAGNOSIS — F4312 Post-traumatic stress disorder, chronic: Secondary | ICD-10-CM
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
## 2023-03-20 ENCOUNTER — Ambulatory Visit (INDEPENDENT_AMBULATORY_CARE_PROVIDER_SITE_OTHER): Payer: Self-pay | Admitting: Licensed Clinical Social Worker

## 2023-03-20 DIAGNOSIS — F4312 Post-traumatic stress disorder, chronic: Secondary | ICD-10-CM

## 2023-03-20 DIAGNOSIS — F329 Major depressive disorder, single episode, unspecified: Secondary | ICD-10-CM

## 2023-03-20 NOTE — Progress Notes (Signed)
Patient did not show for appointment.   

## 2023-04-13 ENCOUNTER — Ambulatory Visit (HOSPITAL_COMMUNITY): Payer: 59 | Admitting: Licensed Clinical Social Worker

## 2023-04-13 ENCOUNTER — Encounter (HOSPITAL_COMMUNITY): Payer: Self-pay

## 2023-04-13 ENCOUNTER — Telehealth (HOSPITAL_COMMUNITY): Payer: Self-pay | Admitting: Licensed Clinical Social Worker

## 2023-04-13 NOTE — Telephone Encounter (Signed)
Patient left voicemail at [phone] that she had not received link for virtual appointment scheduled at 0900. Received Teams message at [phone] from provider that patient was a No-Show. Provider informed of patient call by Teams message at [phone]. Provider declined to see patient (0910 is cut-off time for no-show). Called patient and she stated she did not receive text link and did not want to be charged $50 no-show fee again as she just paid this for previous no-show that she states was not on her calendar.

## 2023-04-13 NOTE — Progress Notes (Signed)
Patient did not show for appointment.   

## 2023-04-18 ENCOUNTER — Encounter (HOSPITAL_COMMUNITY): Payer: Self-pay | Admitting: Psychiatry

## 2023-04-18 ENCOUNTER — Other Ambulatory Visit: Payer: Self-pay

## 2023-04-18 ENCOUNTER — Ambulatory Visit (HOSPITAL_COMMUNITY): Payer: 59 | Admitting: Psychiatry

## 2023-04-18 VITALS — BP 137/75 | HR 87 | Ht 62.0 in | Wt 173.0 lb

## 2023-04-18 DIAGNOSIS — F411 Generalized anxiety disorder: Secondary | ICD-10-CM | POA: Diagnosis not present

## 2023-04-18 MED ORDER — ALPRAZOLAM 0.25 MG PO TABS: ORAL_TABLET | ORAL | 3 refills | Status: DC

## 2023-04-18 MED ORDER — GABAPENTIN 300 MG PO CAPS: ORAL_CAPSULE | ORAL | 4 refills | Status: DC

## 2023-04-18 MED ORDER — ESCITALOPRAM OXALATE 10 MG PO TABS: 10.0000 mg | ORAL_TABLET | Freq: Every day | ORAL | 5 refills | Status: DC

## 2023-04-18 MED ORDER — HYDROXYZINE PAMOATE 25 MG PO CAPS: ORAL_CAPSULE | ORAL | 5 refills | Status: DC
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
## 2023-06-11 ENCOUNTER — Ambulatory Visit (INDEPENDENT_AMBULATORY_CARE_PROVIDER_SITE_OTHER): Payer: 59 | Admitting: Licensed Clinical Social Worker

## 2023-06-11 DIAGNOSIS — F4312 Post-traumatic stress disorder, chronic: Secondary | ICD-10-CM

## 2023-06-11 DIAGNOSIS — F411 Generalized anxiety disorder: Secondary | ICD-10-CM | POA: Diagnosis not present

## 2023-06-11 DIAGNOSIS — F329 Major depressive disorder, single episode, unspecified: Secondary | ICD-10-CM
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
## 2023-06-26 NOTE — H&P (Signed)
TOTAL KNEE ADMISSION H&P  Patient is being admitted for right total knee arthroplasty.  Subjective:  Chief Complaint: Right knee pain.  HPI: Carla Tran, 72 y.o. female has a history of pain and functional disability in the right knee due to arthritis and has failed non-surgical conservative treatments for greater than 12 weeks to include NSAID's and/or analgesics, corticosteriod injections, and activity modification. Onset of symptoms was gradual, starting several years ago with gradually worsening course since that time. The patient noted no past surgery on the right knee.  Patient currently rates pain in the right knee at 8 out of 10 with activity. Patient has night pain, worsening of pain with activity and weight bearing, and pain that interferes with activities of daily living. Patient has evidence of  significant medial joint space narrowing on the AP view. On the lateral view, there is focal bone-on-bone in the medial compartment and bone-on-bone patellofemoral  by imaging studies. There is no active infection.  Patient Active Problem List   Diagnosis Date Noted   Major depressive disorder with single episode, in partial remission (HCC) 07/22/2020   Altered mental status 07/23/2019   Cervical post-laminectomy syndrome 07/23/2019   Cervical spondylosis 07/23/2019   Lumbar facet joint syndrome 07/23/2019   Lumbosacral spondylosis without myelopathy 07/23/2019   Myofascial pain 07/23/2019   Neck pain 07/23/2019   Osteoarthritis of hip 07/23/2019   Unspecified inflammatory spondylopathy, cervical region (HCC) 07/23/2019   Pain of right sacroiliac joint 10/07/2018   Anxiety 05/01/2017   Sleep walking 12/23/2014   Chronic neck and back pain 05/17/2014   Chronic recurrent major depressive disorder (HCC) 05/17/2014   DDD (degenerative disc disease), cervical 05/17/2014   Lumbar disc disease with radiculopathy 05/17/2014   PTSD (post-traumatic stress disorder) 05/17/2014   DIARRHEA  11/17/2009    Past Medical History:  Diagnosis Date   Allergy    Anxiety    Bursitis    shoulder   Cataract    forming    Cervical cancer (HCC)    Depression    DJD (degenerative joint disease)    Endometriosis    Fibromyalgia    Glaucoma    Head ache    Irregular heart beat    PTSD (post-traumatic stress disorder)    PTSD (post-traumatic stress disorder)    Seizures (HCC)    x 1 only- in early 2000's and none since    Skin cancer    Stroke [facility])     Past Surgical History:  Procedure Laterality Date   BACK SURGERY     CHOLECYSTECTOMY     COLONOSCOPY     ligaments removed from elbow     PARTIAL HYSTERECTOMY     skin cancer removed     from face x 2, abdomen and arm left    TUBAL LIGATION      Prior to Admission medications   Medication Sig Start Date End Date Taking? Authorizing Provider  ALPRAZolam Prudy Feeler) 0.25 MG tablet 1 TID 04/18/23   [name], Earvin Hansen, MD  cyclobenzaprine (FLEXERIL) 10 MG tablet Take 30 mg by mouth at bedtime.  03/25/19   [provider]  dicyclomine (BENTYL) 10 MG capsule Take 10 mg by mouth 4 (four) times daily. 02/24/20   [provider]  escitalopram (LEXAPRO) 10 MG tablet Take 1 tablet (10 mg total) by mouth daily. 04/18/23 04/17/24  [name], Earvin Hansen, MD  fluorouracil (EFUDEX) 5 % cream Apply 1 application  topically daily. Apply sparingly to left side of face 02/10/20  [provider]  gabapentin (NEURONTIN) 300 MG capsule 1 qam  1 q midday 2 qhs 04/18/23   [name], Earvin Hansen, MD  hydrOXYzine (VISTARIL) 25 MG capsule 1 qam  2  qhs 04/18/23   [name], Earvin Hansen, MD  Multiple Vitamins-Minerals (MULTIVITAMIN WITH MINERALS) tablet Take 1 tablet by mouth daily.    [provider]  mupirocin ointment (BACTROBAN) 2 % Apply 1 application topically 2 (two) times daily. On left thigh 02/19/20   [provider]  Omega-3 Fatty Acids (FISH OIL) 1000 MG CAPS Take 1 g by mouth daily.    [provider]  PATADAY 0.2 % SOLN  Place 1 drop into both eyes daily. 04/04/14   [provider]    Allergies  Allergen Reactions   Penicillins Anaphylaxis   Haemophilus Influenzae Vaccines Rash   Mirtazapine Other (See Comments)    Pt states this medication made her sleep walk.   Darvon [Propoxyphene] Other (See Comments)   Sulfonamide Derivatives Other (See Comments)    Stomach Spasms    Codeine Itching and Rash   Ivp Dye [Iodinated Contrast Media] Rash    Social History   Socioeconomic History   Marital status: Divorced    Spouse name: Not on file   Number of children: 2   Years of education: Not on file   Highest education level: Not on file  Occupational History   Occupation: disabled  Tobacco Use   Smoking status: Never   Smokeless tobacco: Never  Vaping Use   Vaping status: Never Used  Substance and Sexual Activity   Alcohol use: No    Alcohol/week: 0.0 standard drinks of alcohol   Drug use: Yes    Frequency: 1.0 times per week    Types: Marijuana   Sexual activity: Not on file  Other Topics Concern   Not on file  Social History Narrative   Not on file   Social Determinants of Health   Financial Resource Strain: Medium Risk (06/06/2023)   Received from [facility]   Overall Financial Resource Strain (CARDIA)    Difficulty of Paying Living Expenses: Somewhat hard  Food Insecurity: No Food Insecurity (06/06/2023)   Received from [facility]   Hunger Vital Sign    Worried About Running Out of Food in the Last Year: Never true    Ran Out of Food in the Last Year: Never true  Transportation Needs: No Transportation Needs (06/06/2023)   Received from [facility] - Transportation    Lack of Transportation (Medical): No    Lack of Transportation (Non-Medical): No  Physical Activity: Sufficiently Active (06/06/2023)   Received from [facility]   Exercise Vital Sign    Days of Exercise per Week: 5 days    Minutes of Exercise per Session: 30 min  Stress: Stress  Concern Present (06/06/2023)   Received from [facility] - Occupational Stress Questionnaire    Feeling of Stress : Very much  Social Connections: Socially Isolated (06/06/2023)   Received from [facility]   Social Network    How would you rate your social network (family, work, friends)?: Little participation, lonely and socially isolated  Intimate Partner Violence: Not At Risk (06/06/2023)   Received from [facility]   HITS    Over the last 12 months how often did your partner physically hurt you?: Never    Over the last 12 months how often did your partner insult you or talk down  to you?: Never    Over the last 12 months how often did your partner threaten you with physical harm?: Never    Over the last 12 months how often did your partner scream or curse at you?: Never    Tobacco Use: Low Risk  (06/06/2023)   Received from [facility]   Patient History    Smoking Tobacco Use: Never    Smokeless Tobacco Use: Never    Passive Exposure: Never   Social History   Substance and Sexual Activity  Alcohol Use No   Alcohol/week: 0.0 standard drinks of alcohol    Family History  Problem Relation Age of Onset   Diabetes Mother    Prostate cancer Father    Breast cancer Maternal Aunt    Diabetes Sister    Esophageal cancer Sister    Colon cancer Neg Hx    Rectal cancer Neg Hx    Stomach cancer Neg Hx    Colon polyps Neg Hx     ROS  Objective:  Physical Exam: Well nourished and well developed.  General: Alert and oriented x3, cooperative and pleasant, no acute distress.  Head: normocephalic, atraumatic, neck supple.  Eyes: EOMI. Abdomen: non-tender to palpation and soft, normoactive bowel sounds. Musculoskeletal: - Right knee shows no effusion, slight varus, range of motion 5-125 degrees, moderate crepitus on range of motion. She is tender medially. There is no lateral tenderness or instability. She has an antalgic gait  pattern on the right. Calves soft and nontender. Motor function intact in LE. Strength 5/5 LE bilaterally. Neuro: Distal pulses 2+. Sensation to light touch intact in LE.  Vital signs in last 24 hours: BP: ()/()  Arterial Line BP: ()/()   Imaging Review Plain radiographs demonstrate severe degenerative joint disease of the right knee. The overall alignment is mild varus. The bone quality appears to be adequate for age and reported activity level.  Assessment/Plan:  End stage arthritis, right knee   The patient history, physical examination, clinical judgment of the provider and imaging studies are consistent with end stage degenerative joint disease of the right knee and total knee arthroplasty is deemed medically necessary. The treatment options including medical management, injection therapy arthroscopy and arthroplasty were discussed at length. The risks and benefits of total knee arthroplasty were presented and reviewed. The risks due to aseptic loosening, infection, stiffness, patella tracking problems, thromboembolic complications and other imponderables were discussed. The patient acknowledged the explanation, agreed to proceed with the plan and consent was signed. Patient is being admitted for inpatient treatment for surgery, pain control, PT, OT, prophylactic antibiotics, VTE prophylaxis, progressive ambulation and ADLs and discharge planning. The patient is planning to be discharged  home .  Patient's anticipated LOS is less than 2 midnights, meeting these requirements: - Lives within 1 hour of care - Has a competent adult at home to recover with post-op - NO history of  - Chronic pain requiring opiods  - Diabetes  - Coronary Artery Disease  - Heart failure  - Heart attack  - DVT/VTE  - Cardiac arrhythmia  - Respiratory Failure/COPD  - Renal failure  - Anemia  - Advanced Liver disease  Therapy Plans: [place] [place] Disposition: Home with Son or Neighbor Planned DVT  Prophylaxis: Xarelto (hx stroke) DME Needed: RW PCP: Lytle Michaels, PA-C (clearance received) TXA: IV Allergies: penicillins, iodinated contrast (rash), codeine (itching), sulfa drugs (itching) Anesthesia Concerns: BMI: 32.2 Last HgbA1c: unsure (but not medicated) - will recheck with pre-op labs  Pharmacy: [facility]  Other: -Has tolerated oxycodone  - Patient was instructed on what medications to stop prior to surgery. - Follow-up visit in 2 weeks with Dr. Lequita Halt - Begin physical therapy following surgery - Pre-operative lab work as pre-surgical testing - Prescriptions will be provided in hospital at time of discharge  R. [place]a Jansky, PA-C Orthopedic Surgery [facility] [place]

## 2023-07-09 NOTE — Patient Instructions (Signed)
DUE TO COVID-19 ONLY TWO VISITORS  (aged 71 and older)  ARE ALLOWED TO COME WITH YOU AND STAY IN THE WAITING ROOM ONLY DURING PRE OP AND PROCEDURE.   **NO VISITORS ARE ALLOWED IN THE SHORT STAY AREA OR RECOVERY ROOM!!**  IF YOU WILL BE ADMITTED INTO THE HOSPITAL YOU ARE ALLOWED ONLY FOUR SUPPORT PEOPLE DURING VISITATION HOURS ONLY (7 AM -8PM)   The support person(s) must pass our screening, gel in and out, and wear a mask at all times, including in the patient's room. Patients must also wear a mask when staff or their support person are in the room. Visitors GUEST BADGE MUST BE WORN VISIBLY  One adult visitor may remain with you overnight and MUST be in the room by 8 P.M.     Your procedure is scheduled on: 07/23/23   Report to [facility] Main Entrance    Report to admitting at : 6:50 AM   Call this number if you have problems the morning of surgery [phone]   Do not eat food :After Midnight.   After Midnight you may have the following liquids until : 6:00 AM DAY OF SURGERY  Water Black Coffee (sugar ok, NO MILK/CREAM OR CREAMERS)  Tea (sugar ok, NO MILK/CREAM OR CREAMERS) regular and decaf                             Plain Jell-O (NO RED)                                           Fruit ices (not with fruit pulp, NO RED)                                     Popsicles (NO RED)                                                                  Juice: apple, WHITE grape, WHITE cranberry Sports drinks like Gatorade (NO RED)   The day of surgery:  Drink ONE (1) Pre-Surgery Clear Ensure at : 6:00 AM the morning of surgery. Drink in one sitting. Do not sip.  This drink was given to you during your hospital  pre-op appointment visit. Nothing else to drink after completing the  Pre-Surgery Clear Ensure or G2.          If you have questions, please contact your surgeon's office.  FOLLOW ANY ADDITIONAL PRE OP INSTRUCTIONS YOU RECEIVED FROM YOUR SURGEON'S OFFICE!!!   Oral  Hygiene is also important to reduce your risk of infection.                                    Remember - BRUSH YOUR TEETH THE MORNING OF SURGERY WITH YOUR REGULAR TOOTHPASTE  DENTURES WILL BE REMOVED PRIOR TO SURGERY PLEASE DO NOT APPLY "Poly grip" OR ADHESIVES!!!   Do NOT smoke after Midnight   Take these medicines the morning of surgery  with A SIP OF WATER: alprazolam,hydroxyzine,gabapentin,pregabalin,escitalopram,dicyclomine.Use eye drops as usual.                              You may not have any metal on your body including hair pins, jewelry, and body piercing             Do not wear make-up, lotions, powders, perfumes/cologne, or deodorant  Do not wear nail polish including gel and S&S, artificial/acrylic nails, or any other type of covering on natural nails including finger and toenails. If you have artificial nails, gel coating, etc. that needs to be removed by a nail salon please have this removed prior to surgery or surgery may need to be canceled/ delayed if the surgeon/ anesthesia feels like they are unable to be safely monitored.   Do not shave  48 hours prior to surgery.    Do not bring valuables to the hospital. [facility] IS NOT             RESPONSIBLE   FOR VALUABLES.   Contacts, glasses, or bridgework may not be worn into surgery.   Bring small overnight bag day of surgery.   DO NOT BRING YOUR HOME MEDICATIONS TO THE HOSPITAL. PHARMACY WILL DISPENSE MEDICATIONS LISTED ON YOUR MEDICATION LIST TO YOU DURING YOUR ADMISSION IN THE HOSPITAL!    Patients discharged on the day of surgery will not be allowed to drive home.  Someone NEEDS to stay with you for the first 24 hours after anesthesia.   Special Instructions: Bring a copy of your healthcare power of attorney and living will documents         the day of surgery if you haven't scanned them before.              Please read over the following fact sheets you were given: IF YOU HAVE QUESTIONS ABOUT YOUR PRE-OP  INSTRUCTIONS PLEASE CALL [phone]      Pre-operative 5 CHG Bath Instructions   You can play a key role in reducing the risk of infection after surgery. Your skin needs to be as free of germs as possible. You can reduce the number of germs on your skin by washing with CHG (chlorhexidine gluconate) soap before surgery. CHG is an antiseptic soap that kills germs and continues to kill germs even after washing.   DO NOT use if you have an allergy to chlorhexidine/CHG or antibacterial soaps. If your skin becomes reddened or irritated, stop using the CHG and notify one of our RNs at : [phone].   Please shower with the CHG soap starting 4 days before surgery using the following schedule:     Please keep in mind the following:  DO NOT shave, including legs and underarms, starting the day of your first shower.   You may shave your face at any point before/day of surgery.  Place clean sheets on your bed the day you start using CHG soap. Use a clean washcloth (not used since being washed) for each shower. DO NOT sleep with pets once you start using the CHG.   CHG Shower Instructions:  If you choose to wash your hair and private area, wash first with your normal shampoo/soap.  After you use shampoo/soap, rinse your hair and body thoroughly to remove shampoo/soap residue.  Turn the water OFF and apply about 3 tablespoons (45 ml) of CHG soap to a CLEAN washcloth.  Apply CHG soap ONLY  FROM YOUR NECK DOWN TO YOUR TOES (washing for 3-5 minutes)  DO NOT use CHG soap on face, private areas, open wounds, or sores.  Pay special attention to the area where your surgery is being performed.  If you are having back surgery, having someone wash your back for you may be helpful. Wait 2 minutes after CHG soap is applied, then you may rinse off the CHG soap.  Pat dry with a clean towel  Put on clean clothes/pajamas   If you choose to wear lotion, please use ONLY the CHG-compatible lotions on the back of  this paper.     Additional instructions for the day of surgery: DO NOT APPLY any lotions, deodorants, cologne, or perfumes.   Put on clean/comfortable clothes.  Brush your teeth.  Ask your nurse before applying any prescription medications to the skin.   CHG Compatible Lotions   Aveeno Moisturizing lotion  Cetaphil Moisturizing Cream  Cetaphil Moisturizing Lotion  Clairol Herbal Essence Moisturizing Lotion, Dry Skin  Clairol Herbal Essence Moisturizing Lotion, Extra Dry Skin  Clairol Herbal Essence Moisturizing Lotion, Normal Skin  Curel Age Defying Therapeutic Moisturizing Lotion with Alpha Hydroxy  Curel Extreme Care Body Lotion  Curel Soothing Hands Moisturizing Hand Lotion  Curel Therapeutic Moisturizing Cream, Fragrance-Free  Curel Therapeutic Moisturizing Lotion, Fragrance-Free  Curel Therapeutic Moisturizing Lotion, Original Formula  Eucerin Daily Replenishing Lotion  Eucerin Dry Skin Therapy Plus Alpha Hydroxy Crme  Eucerin Dry Skin Therapy Plus Alpha Hydroxy Lotion  Eucerin Original Crme  Eucerin Original Lotion  Eucerin Plus Crme Eucerin Plus Lotion  Eucerin TriLipid Replenishing Lotion  Keri Anti-Bacterial Hand Lotion  Keri Deep Conditioning Original Lotion Dry Skin Formula Softly Scented  Keri Deep Conditioning Original Lotion, Fragrance Free Sensitive Skin Formula  Keri Lotion Fast Absorbing Fragrance Free Sensitive Skin Formula  Keri Lotion Fast Absorbing Softly Scented Dry Skin Formula  Keri Original Lotion  Keri Skin Renewal Lotion Keri Silky Smooth Lotion  Keri Silky Smooth Sensitive Skin Lotion  Nivea Body Creamy Conditioning Oil  Nivea Body Extra Enriched Lotion  Nivea Body Original Lotion  Nivea Body Sheer Moisturizing Lotion Nivea Crme  Nivea Skin Firming Lotion  NutraDerm 30 Skin Lotion  NutraDerm Skin Lotion  NutraDerm Therapeutic Skin Cream  NutraDerm Therapeutic Skin Lotion  ProShield Protective Hand Cream  Provon moisturizing lotion    Incentive Spirometer  An incentive spirometer is a tool that can help keep your lungs clear and active. This tool measures how well you are filling your lungs with each breath. Taking long deep breaths may help reverse or decrease the chance of developing breathing (pulmonary) problems (especially infection) following: A long period of time when you are unable to move or be active. BEFORE THE PROCEDURE  If the spirometer includes an indicator to show your best effort, your nurse or respiratory therapist will set it to a desired goal. If possible, sit up straight or lean slightly forward. Try not to slouch. Hold the incentive spirometer in an upright position. INSTRUCTIONS FOR USE  Sit on the edge of your bed if possible, or sit up as far as you can in bed or on a chair. Hold the incentive spirometer in an upright position. Breathe out normally. Place the mouthpiece in your mouth and seal your lips tightly around it. Breathe in slowly and as deeply as possible, raising the piston or the ball toward the top of the column. Hold your breath for 3-5 seconds or for as long as possible. Allow the piston  or ball to fall to the bottom of the column. Remove the mouthpiece from your mouth and breathe out normally. Rest for a few seconds and repeat Steps 1 through 7 at least 10 times every 1-2 hours when you are awake. Take your time and take a few normal breaths between deep breaths. The spirometer may include an indicator to show your best effort. Use the indicator as a goal to work toward during each repetition. After each set of 10 deep breaths, practice coughing to be sure your lungs are clear. If you have an incision (the cut made at the time of surgery), support your incision when coughing by placing a pillow or rolled up towels firmly against it. Once you are able to get out of bed, walk around indoors and cough well. You may stop using the incentive spirometer when instructed by your caregiver.   RISKS AND COMPLICATIONS Take your time so you do not get dizzy or light-headed. If you are in pain, you may need to take or ask for pain medication before doing incentive spirometry. It is harder to take a deep breath if you are having pain. AFTER USE Rest and breathe slowly and easily. It can be helpful to keep track of a log of your progress. Your caregiver can provide you with a simple table to help with this. If you are using the spirometer at home, follow these instructions: SEEK MEDICAL CARE IF:  You are having difficultly using the spirometer. You have trouble using the spirometer as often as instructed. Your pain medication is not giving enough relief while using the spirometer. You develop fever of 100.5 F (38.1 C) or higher. SEEK IMMEDIATE MEDICAL CARE IF:  You cough up bloody sputum that had not been present before. You develop fever of 102 F (38.9 C) or greater. You develop worsening pain at or near the incision site. MAKE SURE YOU:  Understand these instructions. Will watch your condition. Will get help right away if you are not doing well or get worse. Document Released: 12/11/2006 Document Revised: 10/23/2011 Document Reviewed: 02/11/2007 [employer] Patient Information 2014 [place], [place].   ________________________________________________________________________

## 2023-07-10 ENCOUNTER — Encounter (HOSPITAL_COMMUNITY): Payer: Self-pay

## 2023-07-10 ENCOUNTER — Encounter (HOSPITAL_COMMUNITY): Admission: RE | Admit: 2023-07-10 | Payer: 59 | Source: Ambulatory Visit

## 2023-07-10 ENCOUNTER — Encounter (HOSPITAL_COMMUNITY)
Admission: RE | Admit: 2023-07-10 | Discharge: 2023-07-10 | Disposition: A | Discharge status: Home / Self Care | Payer: 59 | Source: Ambulatory Visit | Attending: Orthopedic Surgery | Admitting: Orthopedic Surgery

## 2023-07-10 ENCOUNTER — Other Ambulatory Visit: Payer: Self-pay

## 2023-07-10 VITALS — BP 138/81 | HR 87 | Temp 98.3°F | Ht 62.0 in | Wt 172.0 lb

## 2023-07-10 DIAGNOSIS — I2510 Atherosclerotic heart disease of native coronary artery without angina pectoris: Secondary | ICD-10-CM | POA: Diagnosis not present

## 2023-07-10 DIAGNOSIS — Z01812 Encounter for preprocedural laboratory examination: Secondary | ICD-10-CM | POA: Diagnosis present

## 2023-07-10 DIAGNOSIS — Z01810 Encounter for preprocedural cardiovascular examination: Secondary | ICD-10-CM | POA: Diagnosis present

## 2023-07-10 DIAGNOSIS — R9431 Abnormal electrocardiogram [ECG] [EKG]: Secondary | ICD-10-CM | POA: Insufficient documentation

## 2023-07-10 DIAGNOSIS — Z01818 Encounter for other preprocedural examination: Secondary | ICD-10-CM | POA: Insufficient documentation

## 2023-07-10 HISTORY — DX: Essential (primary) hypertension: I10

## 2023-07-10 HISTORY — DX: Angina pectoris, unspecified: I20.9

## 2023-07-10 HISTORY — DX: Dyspnea, unspecified: R06.00

## 2023-07-10 HISTORY — DX: Cardiac murmur, unspecified: R01.1

## 2023-07-10 HISTORY — DX: Unspecified asthma, uncomplicated: J45.909

## 2023-07-10 HISTORY — DX: Cardiac arrhythmia, unspecified: I49.9

## 2023-07-10 LAB — CBC
HCT: 39.2 % (ref 36.0–46.0)
Hemoglobin: 12.8 g/dL (ref 12.0–15.0)
MCH: 28.5 pg (ref 26.0–34.0)
MCHC: 32.7 g/dL (ref 30.0–36.0)
MCV: 87.3 fL (ref 80.0–100.0)
Platelets: 276 10*3/uL (ref 150–400)
RBC: 4.49 MIL/uL (ref 3.87–5.11)
RDW: 14.2 % (ref 11.5–15.5)
WBC: 6.3 10*3/uL (ref 4.0–10.5)
nRBC: 0 % (ref 0.0–0.2)

## 2023-07-10 LAB — SURGICAL PCR SCREEN
MRSA, PCR: NEGATIVE
Staphylococcus aureus: NEGATIVE

## 2023-07-10 LAB — BASIC METABOLIC PANEL
Anion gap: 17 — ABNORMAL HIGH (ref 5–15)
BUN: 14 mg/dL (ref 8–23)
CO2: 26 mmol/L (ref 22–32)
Calcium: 9.8 mg/dL (ref 8.9–10.3)
Chloride: 98 mmol/L (ref 98–111)
Creatinine, Ser: 1.03 mg/dL — ABNORMAL HIGH (ref 0.44–1.00)
GFR, Estimated: 58 mL/min — ABNORMAL LOW (ref >60)
Glucose, Bld: 152 mg/dL — ABNORMAL HIGH (ref 70–99)
Potassium: 4.2 mmol/L (ref 3.5–5.1)
Sodium: 141 mmol/L (ref 135–145)

## 2023-07-10 NOTE — Progress Notes (Addendum)
For Anesthesia: PCP - Lytle Michaels, PA-C . LOV: 06/06/23: [facility] Cardiologist - N/A  Bowel Prep reminder:  Chest x-ray -  EKG - 07/10/23 Stress Test -  ECHO -  Cardiac Cath -  Pacemaker/ICD device last checked: Pacemaker orders received: Device Rep notified:  Spinal Cord Stimulator: N/A  Sleep Study - Yes CPAP - NO  Fasting Blood Sugar - N/A Checks Blood Sugar _____ times a day Date and result of last Hgb A1c-  Last dose of GLP1 agonist- N/A GLP1 instructions:   Last dose of SGLT-2 inhibitors- N/A SGLT-2 instructions:   Blood Thinner Instructions:N/A Aspirin Instructions: Last Dose:  Activity level: Can go up a flight of stairs and activities of daily living without stopping and without chest pain and/or shortness of breath      Unable to go up a flight of stairs without shortness of breath    Anesthesia review: Hx: Stroke,Irregular HB,Seizure (last 2006),SOB,PTSD.  Patient denies shortness of breath, fever, cough and chest pain at PAT appointment   Patient verbalized understanding of instructions that were given to them at the PAT appointment. Patient was also instructed that they will need to review over the PAT instructions again at home before surgery.

## 2023-07-18 ENCOUNTER — Ambulatory Visit (HOSPITAL_COMMUNITY): Payer: 59 | Admitting: Psychiatry

## 2023-07-22 NOTE — Anesthesia Preprocedure Evaluation (Signed)
Anesthesia Evaluation  Patient identified by MRN, date of birth, ID band Patient awake    Reviewed: Allergy & Precautions, NPO status , Patient's Chart, lab work & pertinent test results  History of Anesthesia Complications Negative for: history of anesthetic complications  Airway Mallampati: II  TM Distance: >3 FB Neck ROM: Full    Dental  (+) Dental Advisory Given   Pulmonary neg shortness of breath, asthma , neg sleep apnea, neg COPD, neg recent URI   Pulmonary exam normal breath sounds clear to auscultation       Cardiovascular hypertension, (-) angina (-) Past MI, (-) Cardiac Stents and (-) CABG + dysrhythmias + Valvular Problems/Murmurs  Rhythm:Regular Rate:Normal     Neuro/Psych  Headaches, Seizures - (x1 in early 2000s), Well Controlled,  PSYCHIATRIC DISORDERS (PTSD) Anxiety Depression     Neuromuscular disease (lumbar disc disease with radiculopathy) CVA, No Residual Symptoms    GI/Hepatic negative GI ROS, Neg liver ROS,,,  Endo/Other  negative endocrine ROS    Renal/GU negative Renal ROS     Musculoskeletal  (+) Arthritis , Osteoarthritis,  Fibromyalgia -  Abdominal  (+) + obese  Peds  Hematology negative hematology ROS (+) Lab Results      Component                Value               Date                      WBC                      6.3                 07/10/2023                HGB                      12.8                07/10/2023                HCT                      39.2                07/10/2023                MCV                      87.3                07/10/2023                PLT                      276                 07/10/2023              Anesthesia Other Findings   Reproductive/Obstetrics Endometriosis, h/o cervical cancer                             Anesthesia Physical Anesthesia Plan  ASA: 3  Anesthesia Plan: MAC, Regional and Spinal   Post-op Pain  Management: Tylenol PO (pre-op)* and Regional block*  Induction: Intravenous  PONV Risk Score and Plan: 2 and Ondansetron, Dexamethasone, Propofol infusion, TIVA and Treatment may vary due to age or medical condition  Airway Management Planned: Natural Airway and Simple Face Mask  Additional Equipment:   Intra-op Plan:   Post-operative Plan:   Informed Consent: I have reviewed the patients History and Physical, chart, labs and discussed the procedure including the risks, benefits and alternatives for the proposed anesthesia with the patient or authorized representative who has indicated his/her understanding and acceptance.     Dental advisory given  Plan Discussed with: CRNA and Anesthesiologist  Anesthesia Plan Comments: (Discussed potential risks of nerve blocks including, but not limited to, infection, bleeding, nerve damage, seizures, pneumothorax, respiratory depression, and potential failure of the block. Alternatives to nerve blocks discussed. All questions answered.  I have discussed risks of neuraxial anesthesia including but not limited to infection, bleeding, nerve injury, back pain, headache, seizures, and failure of block. Patient denies bleeding disorders and is not currently anticoagulated. Labs have been reviewed. Risks and benefits discussed. All patient's questions answered.   Discussed with patient risks of MAC including, but not limited to, minor pain or discomfort, hearing people in the room, and possible need for backup general anesthesia. Risks for general anesthesia also discussed including, but not limited to, sore throat, hoarse voice, chipped/damaged teeth, injury to vocal cords, nausea and vomiting, allergic reactions, lung infection, heart attack, stroke, and death. All questions answered. )       Anesthesia Quick Evaluation

## 2023-07-23 ENCOUNTER — Encounter (HOSPITAL_COMMUNITY): Payer: Self-pay | Admitting: Orthopedic Surgery

## 2023-07-23 ENCOUNTER — Observation Stay (HOSPITAL_COMMUNITY)
Admission: RE | Admit: 2023-07-23 | Discharge: 2023-07-25 | Disposition: A | Discharge status: Skilled Nursing Facility | Payer: 59 | Attending: Orthopedic Surgery | Admitting: Orthopedic Surgery

## 2023-07-23 ENCOUNTER — Ambulatory Visit (HOSPITAL_COMMUNITY): Payer: 59 | Admitting: Physician Assistant

## 2023-07-23 ENCOUNTER — Encounter (HOSPITAL_COMMUNITY)
Admission: RE | Disposition: A | Discharge status: Skilled Nursing Facility | Payer: Self-pay | Source: Home / Self Care | Attending: Orthopedic Surgery

## 2023-07-23 ENCOUNTER — Ambulatory Visit (HOSPITAL_BASED_OUTPATIENT_CLINIC_OR_DEPARTMENT_OTHER): Payer: 59 | Admitting: Anesthesiology

## 2023-07-23 ENCOUNTER — Other Ambulatory Visit: Payer: Self-pay

## 2023-07-23 DIAGNOSIS — Z79899 Other long term (current) drug therapy: Secondary | ICD-10-CM | POA: Diagnosis not present

## 2023-07-23 DIAGNOSIS — Z8541 Personal history of malignant neoplasm of cervix uteri: Secondary | ICD-10-CM | POA: Diagnosis not present

## 2023-07-23 DIAGNOSIS — Z8673 Personal history of transient ischemic attack (TIA), and cerebral infarction without residual deficits: Secondary | ICD-10-CM | POA: Insufficient documentation

## 2023-07-23 DIAGNOSIS — J45909 Unspecified asthma, uncomplicated: Secondary | ICD-10-CM | POA: Insufficient documentation

## 2023-07-23 DIAGNOSIS — Z85828 Personal history of other malignant neoplasm of skin: Secondary | ICD-10-CM | POA: Insufficient documentation

## 2023-07-23 DIAGNOSIS — Z01818 Encounter for other preprocedural examination: Principal | ICD-10-CM

## 2023-07-23 DIAGNOSIS — M1711 Unilateral primary osteoarthritis, right knee: Principal | ICD-10-CM | POA: Insufficient documentation

## 2023-07-23 DIAGNOSIS — I10 Essential (primary) hypertension: Secondary | ICD-10-CM | POA: Diagnosis not present

## 2023-07-23 HISTORY — PX: TOTAL KNEE ARTHROPLASTY: SHX125

## 2023-07-23 LAB — HEMOGLOBIN A1C
Hgb A1c MFr Bld: 5.6 % (ref 4.8–5.6)
Mean Plasma Glucose: 114.02 mg/dL

## 2023-07-23 SURGERY — ARTHROPLASTY, KNEE, TOTAL
Anesthesia: Monitor Anesthesia Care | Site: Knee | Laterality: Right

## 2023-07-23 MED ORDER — HYDROXYZINE HCL 25 MG PO TABS
25.0000 mg | ORAL_TABLET | Freq: Every day | ORAL | Status: DC
  Administered 2023-07-24 – 2023-07-25 (×2): 25 mg via ORAL
  Filled 2023-07-23 (×2): qty 1

## 2023-07-23 MED ORDER — OXYCODONE HCL 5 MG PO TABS
10.0000 mg | ORAL_TABLET | ORAL | Status: DC | PRN
  Administered 2023-07-23 – 2023-07-24 (×2): 15 mg via ORAL
  Administered 2023-07-24: 10 mg via ORAL
  Administered 2023-07-24: 15 mg via ORAL
  Administered 2023-07-25: 10 mg via ORAL
  Filled 2023-07-23 (×2): qty 2
  Filled 2023-07-23 (×3): qty 3
  Filled 2023-07-23: qty 2

## 2023-07-23 MED ORDER — DEXAMETHASONE SODIUM PHOSPHATE 10 MG/ML IJ SOLN
10.0000 mg | Freq: Once | INTRAMUSCULAR | Status: AC
  Administered 2023-07-24: 10 mg via INTRAVENOUS
  Filled 2023-07-23: qty 1

## 2023-07-23 MED ORDER — GABAPENTIN 300 MG PO CAPS
300.0000 mg | ORAL_CAPSULE | Freq: Two times a day (BID) | ORAL | Status: DC
  Administered 2023-07-23 – 2023-07-25 (×5): 300 mg via ORAL
  Filled 2023-07-23 (×5): qty 1

## 2023-07-23 MED ORDER — ACETAMINOPHEN 500 MG PO TABS
1000.0000 mg | ORAL_TABLET | Freq: Four times a day (QID) | ORAL | Status: AC
  Administered 2023-07-23 – 2023-07-24 (×4): 1000 mg via ORAL
  Filled 2023-07-23 (×4): qty 2

## 2023-07-23 MED ORDER — ALPRAZOLAM 0.25 MG PO TABS
0.2500 mg | ORAL_TABLET | Freq: Every day | ORAL | Status: DC
  Administered 2023-07-24 – 2023-07-25 (×2): 0.25 mg via ORAL
  Filled 2023-07-23 (×2): qty 1

## 2023-07-23 MED ORDER — PREGABALIN 25 MG PO CAPS
25.0000 mg | ORAL_CAPSULE | Freq: Three times a day (TID) | ORAL | Status: DC
  Administered 2023-07-23 – 2023-07-25 (×7): 25 mg via ORAL
  Filled 2023-07-23 (×7): qty 1

## 2023-07-23 MED ORDER — BUPIVACAINE LIPOSOME 1.3 % IJ SUSP
INTRAMUSCULAR | Status: AC
  Filled 2023-07-23: qty 20

## 2023-07-23 MED ORDER — MIDAZOLAM HCL 2 MG/2ML IJ SOLN: 1.0000 mg | INTRAMUSCULAR | Status: DC

## 2023-07-23 MED ORDER — MIDAZOLAM HCL 2 MG/2ML IJ SOLN
INTRAMUSCULAR | Status: AC
  Filled 2023-07-23: qty 2

## 2023-07-23 MED ORDER — TRANEXAMIC ACID-NACL 1000-0.7 MG/100ML-% IV SOLN
1000.0000 mg | INTRAVENOUS | Status: AC
  Administered 2023-07-23: 1000 mg via INTRAVENOUS
  Filled 2023-07-23: qty 100

## 2023-07-23 MED ORDER — PHENYLEPHRINE 80 MCG/ML (10ML) SYRINGE FOR IV PUSH (FOR BLOOD PRESSURE SUPPORT)
PREFILLED_SYRINGE | INTRAVENOUS | Status: AC
  Filled 2023-07-23: qty 10

## 2023-07-23 MED ORDER — BISACODYL 10 MG RE SUPP: 10.0000 mg | Freq: Every day | RECTAL | Status: DC | PRN

## 2023-07-23 MED ORDER — LIDOCAINE HCL (CARDIAC) PF 100 MG/5ML IV SOSY
PREFILLED_SYRINGE | INTRAVENOUS | Status: DC | PRN
  Administered 2023-07-23: 60 mg via INTRAVENOUS

## 2023-07-23 MED ORDER — PROPOFOL 500 MG/50ML IV EMUL
INTRAVENOUS | Status: DC | PRN
  Administered 2023-07-23: 70 ug/kg/min via INTRAVENOUS

## 2023-07-23 MED ORDER — ROPIVACAINE HCL 5 MG/ML IJ SOLN
INTRAMUSCULAR | Status: DC | PRN
  Administered 2023-07-23: 20 mL via PERINEURAL

## 2023-07-23 MED ORDER — FENTANYL CITRATE (PF) 100 MCG/2ML IJ SOLN
INTRAMUSCULAR | Status: AC
  Filled 2023-07-23: qty 2

## 2023-07-23 MED ORDER — ALPRAZOLAM 0.25 MG PO TABS: 0.2500 mg | ORAL_TABLET | ORAL | Status: DC

## 2023-07-23 MED ORDER — ACETAMINOPHEN 10 MG/ML IV SOLN
1000.0000 mg | Freq: Four times a day (QID) | INTRAVENOUS | Status: DC
  Administered 2023-07-23: 1000 mg via INTRAVENOUS
  Filled 2023-07-23: qty 100

## 2023-07-23 MED ORDER — BUPIVACAINE IN DEXTROSE 0.75-8.25 % IT SOLN
INTRATHECAL | Status: DC | PRN
  Administered 2023-07-23: 1.6 mL via INTRATHECAL

## 2023-07-23 MED ORDER — DEXAMETHASONE SODIUM PHOSPHATE 10 MG/ML IJ SOLN
8.0000 mg | Freq: Once | INTRAMUSCULAR | Status: AC
  Administered 2023-07-23: 8 mg via INTRAVENOUS

## 2023-07-23 MED ORDER — CYCLOBENZAPRINE HCL 10 MG PO TABS
10.0000 mg | ORAL_TABLET | Freq: Three times a day (TID) | ORAL | Status: DC | PRN
  Administered 2023-07-23 – 2023-07-24 (×2): 10 mg via ORAL
  Filled 2023-07-23 (×2): qty 1

## 2023-07-23 MED ORDER — ALPRAZOLAM 0.5 MG PO TABS
0.5000 mg | ORAL_TABLET | Freq: Every day | ORAL | Status: DC
  Administered 2023-07-24 – 2023-07-25 (×2): 0.5 mg via ORAL
  Filled 2023-07-23 (×2): qty 1

## 2023-07-23 MED ORDER — GLYCOPYRROLATE 0.2 MG/ML IJ SOLN
INTRAMUSCULAR | Status: AC
  Filled 2023-07-23: qty 1

## 2023-07-23 MED ORDER — STERILE WATER FOR IRRIGATION IR SOLN
Status: DC | PRN
  Administered 2023-07-23: 1000 mL

## 2023-07-23 MED ORDER — METOCLOPRAMIDE HCL 5 MG/ML IJ SOLN: 5.0000 mg | Freq: Three times a day (TID) | INTRAMUSCULAR | Status: DC | PRN

## 2023-07-23 MED ORDER — 0.9 % SODIUM CHLORIDE (POUR BTL) OPTIME
TOPICAL | Status: DC | PRN
  Administered 2023-07-23: 1000 mL

## 2023-07-23 MED ORDER — GABAPENTIN 300 MG PO CAPS
600.0000 mg | ORAL_CAPSULE | Freq: Every day | ORAL | Status: DC
  Administered 2023-07-23 – 2023-07-24 (×2): 600 mg via ORAL
  Filled 2023-07-23 (×2): qty 2

## 2023-07-23 MED ORDER — CEFAZOLIN SODIUM-DEXTROSE 2-4 GM/100ML-% IV SOLN
2.0000 g | Freq: Four times a day (QID) | INTRAVENOUS | Status: AC
  Administered 2023-07-23 (×2): 2 g via INTRAVENOUS
  Filled 2023-07-23 (×2): qty 100

## 2023-07-23 MED ORDER — GABAPENTIN 300 MG PO CAPS: 300.0000 mg | ORAL_CAPSULE | ORAL | Status: DC

## 2023-07-23 MED ORDER — MENTHOL 3 MG MT LOZG
1.0000 | LOZENGE | OROMUCOSAL | Status: DC | PRN
  Filled 2023-07-23: qty 9

## 2023-07-23 MED ORDER — ESCITALOPRAM OXALATE 10 MG PO TABS
10.0000 mg | ORAL_TABLET | Freq: Every day | ORAL | Status: DC
  Administered 2023-07-24 – 2023-07-25 (×2): 10 mg via ORAL
  Filled 2023-07-23 (×2): qty 1

## 2023-07-23 MED ORDER — LIDOCAINE HCL (PF) 2 % IJ SOLN
INTRAMUSCULAR | Status: AC
  Filled 2023-07-23: qty 5

## 2023-07-23 MED ORDER — ONDANSETRON HCL 4 MG/2ML IJ SOLN
4.0000 mg | Freq: Four times a day (QID) | INTRAMUSCULAR | Status: DC | PRN
Start: 2023-07-23 — End: 2023-07-25

## 2023-07-23 MED ORDER — ONDANSETRON HCL 4 MG PO TABS: 4.0000 mg | ORAL_TABLET | Freq: Four times a day (QID) | ORAL | Status: DC | PRN

## 2023-07-23 MED ORDER — FLEET ENEMA RE ENEM
1.0000 | ENEMA | Freq: Once | RECTAL | Status: DC | PRN
Start: 2023-07-23 — End: 2023-07-25

## 2023-07-23 MED ORDER — IPRATROPIUM BROMIDE 0.06 % NA SOLN
2.0000 | Freq: Two times a day (BID) | NASAL | Status: DC
Start: 2023-07-23 — End: 2023-07-25
  Administered 2023-07-23 – 2023-07-25 (×4): 2 via NASAL
  Filled 2023-07-23: qty 15

## 2023-07-23 MED ORDER — DIPHENHYDRAMINE HCL 12.5 MG/5ML PO ELIX
12.5000 mg | ORAL_SOLUTION | ORAL | Status: DC | PRN
  Administered 2023-07-23 – 2023-07-25 (×3): 25 mg via ORAL
  Filled 2023-07-23 (×3): qty 10

## 2023-07-23 MED ORDER — CHLORHEXIDINE GLUCONATE 0.12 % MT SOLN
15.0000 mL | Freq: Once | OROMUCOSAL | Status: AC
  Administered 2023-07-23: 15 mL via OROMUCOSAL

## 2023-07-23 MED ORDER — DEXAMETHASONE SODIUM PHOSPHATE 10 MG/ML IJ SOLN
INTRAMUSCULAR | Status: AC
  Filled 2023-07-23: qty 1

## 2023-07-23 MED ORDER — MIDAZOLAM HCL 5 MG/5ML IJ SOLN
INTRAMUSCULAR | Status: DC | PRN
  Administered 2023-07-23: 2 mg via INTRAVENOUS

## 2023-07-23 MED ORDER — CEFAZOLIN SODIUM-DEXTROSE 2-4 GM/100ML-% IV SOLN
2.0000 g | INTRAVENOUS | Status: AC
  Administered 2023-07-23: 2 g via INTRAVENOUS
  Filled 2023-07-23: qty 100

## 2023-07-23 MED ORDER — DEXMEDETOMIDINE HCL IN NACL 80 MCG/20ML IV SOLN
INTRAVENOUS | Status: AC
  Filled 2023-07-23: qty 20

## 2023-07-23 MED ORDER — SODIUM CHLORIDE (PF) 0.9 % IJ SOLN
INTRAMUSCULAR | Status: AC
  Filled 2023-07-23: qty 10

## 2023-07-23 MED ORDER — FENTANYL CITRATE PF 50 MCG/ML IJ SOSY
50.0000 ug | PREFILLED_SYRINGE | INTRAMUSCULAR | Status: DC
  Filled 2023-07-23: qty 1
  Filled 2023-07-23: qty 2

## 2023-07-23 MED ORDER — PHENYLEPHRINE HCL-NACL 20-0.9 MG/250ML-% IV SOLN
INTRAVENOUS | Status: DC | PRN
  Administered 2023-07-23: 40 ug/min via INTRAVENOUS

## 2023-07-23 MED ORDER — ORAL CARE MOUTH RINSE: 15.0000 mL | Freq: Once | OROMUCOSAL | Status: AC

## 2023-07-23 MED ORDER — DOCUSATE SODIUM 100 MG PO CAPS
100.0000 mg | ORAL_CAPSULE | Freq: Two times a day (BID) | ORAL | Status: DC
  Administered 2023-07-23 – 2023-07-25 (×4): 100 mg via ORAL
  Filled 2023-07-23 (×4): qty 1

## 2023-07-23 MED ORDER — ONDANSETRON HCL 4 MG/2ML IJ SOLN
INTRAMUSCULAR | Status: AC
Start: 2023-07-23 — End: ?
  Filled 2023-07-23: qty 2

## 2023-07-23 MED ORDER — SODIUM CHLORIDE (PF) 0.9 % IJ SOLN
INTRAMUSCULAR | Status: DC | PRN
  Administered 2023-07-23: 60 mL via INTRAVENOUS

## 2023-07-23 MED ORDER — PHENYLEPHRINE HCL (PRESSORS) 10 MG/ML IV SOLN
INTRAVENOUS | Status: DC | PRN
  Administered 2023-07-23: 80 ug via INTRAVENOUS
  Administered 2023-07-23: 160 ug via INTRAVENOUS

## 2023-07-23 MED ORDER — HYDROMORPHONE HCL 1 MG/ML IJ SOLN
0.5000 mg | INTRAMUSCULAR | Status: DC | PRN
  Administered 2023-07-23: 1 mg via INTRAVENOUS
  Filled 2023-07-23: qty 1

## 2023-07-23 MED ORDER — POLYETHYLENE GLYCOL 3350 17 G PO PACK
17.0000 g | PACK | Freq: Every day | ORAL | Status: DC | PRN
  Administered 2023-07-24 – 2023-07-25 (×2): 17 g via ORAL
  Filled 2023-07-23 (×2): qty 1

## 2023-07-23 MED ORDER — SODIUM CHLORIDE (PF) 0.9 % IJ SOLN
INTRAMUSCULAR | Status: AC
  Filled 2023-07-23: qty 50

## 2023-07-23 MED ORDER — LACTATED RINGERS IV SOLN: INTRAVENOUS | Status: DC

## 2023-07-23 MED ORDER — OXYCODONE HCL 5 MG PO TABS
5.0000 mg | ORAL_TABLET | ORAL | Status: DC | PRN
  Administered 2023-07-23 – 2023-07-25 (×3): 10 mg via ORAL
  Filled 2023-07-23 (×2): qty 2

## 2023-07-23 MED ORDER — ACETAMINOPHEN 325 MG PO TABS: 325.0000 mg | ORAL_TABLET | Freq: Four times a day (QID) | ORAL | Status: DC | PRN

## 2023-07-23 MED ORDER — RIVAROXABAN 10 MG PO TABS
10.0000 mg | ORAL_TABLET | Freq: Every day | ORAL | Status: DC
  Administered 2023-07-24 – 2023-07-25 (×2): 10 mg via ORAL
  Filled 2023-07-23 (×2): qty 1

## 2023-07-23 MED ORDER — ALPRAZOLAM 0.25 MG PO TABS
0.2500 mg | ORAL_TABLET | Freq: Every day | ORAL | Status: DC
  Administered 2023-07-23 – 2023-07-24 (×2): 0.25 mg via ORAL
  Filled 2023-07-23 (×2): qty 1

## 2023-07-23 MED ORDER — POVIDONE-IODINE 10 % EX SWAB: 2.0000 | Freq: Once | CUTANEOUS | Status: DC

## 2023-07-23 MED ORDER — MONTELUKAST SODIUM 10 MG PO TABS
10.0000 mg | ORAL_TABLET | Freq: Every day | ORAL | Status: DC
  Administered 2023-07-24 – 2023-07-25 (×2): 10 mg via ORAL
  Filled 2023-07-23 (×2): qty 1

## 2023-07-23 MED ORDER — HYDROXYZINE HCL 25 MG PO TABS
50.0000 mg | ORAL_TABLET | Freq: Every day | ORAL | Status: DC
  Administered 2023-07-23 – 2023-07-24 (×2): 50 mg via ORAL
  Filled 2023-07-23 (×2): qty 2

## 2023-07-23 MED ORDER — SODIUM CHLORIDE 0.9 % IR SOLN
Status: DC | PRN
  Administered 2023-07-23: 1000 mL

## 2023-07-23 MED ORDER — SODIUM CHLORIDE 0.9 % IV SOLN: INTRAVENOUS | Status: DC

## 2023-07-23 MED ORDER — BUPIVACAINE LIPOSOME 1.3 % IJ SUSP: 20.0000 mL | Freq: Once | INTRAMUSCULAR | Status: DC

## 2023-07-23 MED ORDER — FENTANYL CITRATE (PF) 100 MCG/2ML IJ SOLN
INTRAMUSCULAR | Status: DC | PRN
  Administered 2023-07-23: 50 ug via INTRAVENOUS
  Administered 2023-07-23 (×2): 12.5 ug via INTRAVENOUS
  Administered 2023-07-23: 25 ug via INTRAVENOUS

## 2023-07-23 MED ORDER — PHENOL 1.4 % MT LIQD: 1.0000 | OROMUCOSAL | Status: DC | PRN

## 2023-07-23 MED ORDER — DICYCLOMINE HCL 10 MG PO CAPS
10.0000 mg | ORAL_CAPSULE | Freq: Two times a day (BID) | ORAL | Status: DC
  Administered 2023-07-23 – 2023-07-25 (×4): 10 mg via ORAL
  Filled 2023-07-23 (×4): qty 1

## 2023-07-23 MED ORDER — BUPIVACAINE LIPOSOME 1.3 % IJ SUSP
INTRAMUSCULAR | Status: DC | PRN
  Administered 2023-07-23: 20 mL

## 2023-07-23 MED ORDER — METOCLOPRAMIDE HCL 5 MG PO TABS: 5.0000 mg | ORAL_TABLET | Freq: Three times a day (TID) | ORAL | Status: DC | PRN

## 2023-07-23 MED ORDER — HYDROXYZINE PAMOATE 25 MG PO CAPS
25.0000 mg | ORAL_CAPSULE | ORAL | Status: DC
Start: 2023-07-23 — End: 2023-07-23

## 2023-07-23 SURGICAL SUPPLY — 45 items
ATTUNE PSFEM RTSZ4 NARCEM KNEE (Femur) IMPLANT
ATTUNE PSRP INSR SZ4 7 KNEE (Insert) IMPLANT
BAG COUNTER SPONGE SURGICOUNT (BAG) IMPLANT
BAG ZIPLOCK 12X15 (MISCELLANEOUS) ×2 IMPLANT
BASE TIBIAL ROT PLAT SZ 3 KNEE (Knees) IMPLANT
BLADE SAG 18X100X1.27 (BLADE) ×2 IMPLANT
BLADE SAW SGTL 11.0X1.19X90.0M (BLADE) ×2 IMPLANT
BLADE SAW STABLE CUT 109 (BLADE) IMPLANT
BNDG ELASTIC 6INX 5YD STR LF (GAUZE/BANDAGES/DRESSINGS) ×2 IMPLANT
BOWL SMART MIX CTS (DISPOSABLE) ×2 IMPLANT
CEMENT HV SMART SET (Cement) ×4 IMPLANT
COVER SURGICAL LIGHT HANDLE (MISCELLANEOUS) ×2 IMPLANT
CUFF TRNQT CYL 34X4.125X (TOURNIQUET CUFF) ×2 IMPLANT
DERMABOND ADVANCED .7 DNX12 (GAUZE/BANDAGES/DRESSINGS) ×2 IMPLANT
DRAPE U-SHAPE 47X51 STRL (DRAPES) ×2 IMPLANT
DRSG AQUACEL AG ADV 3.5X10 (GAUZE/BANDAGES/DRESSINGS) ×2 IMPLANT
DURAPREP 26ML APPLICATOR (WOUND CARE) ×2 IMPLANT
ELECT REM PT RETURN 15FT ADLT (MISCELLANEOUS) ×2 IMPLANT
GLOVE BIO SURGEON STRL SZ 6.5 (GLOVE) IMPLANT
GLOVE BIO SURGEON STRL SZ8 (GLOVE) ×2 IMPLANT
GLOVE BIOGEL PI IND STRL 6.5 (GLOVE) IMPLANT
GLOVE BIOGEL PI IND STRL 7.0 (GLOVE) IMPLANT
GLOVE BIOGEL PI IND STRL 8 (GLOVE) ×2 IMPLANT
GOWN STRL REUS W/ TWL LRG LVL3 (GOWN DISPOSABLE) ×2 IMPLANT
HOLDER FOLEY CATH W/STRAP (MISCELLANEOUS) IMPLANT
IMMOBILIZER KNEE 20 (SOFTGOODS) ×1
IMMOBILIZER KNEE 20 THIGH 36 (SOFTGOODS) ×2 IMPLANT
KIT TURNOVER KIT A (KITS) IMPLANT
MANIFOLD NEPTUNE II (INSTRUMENTS) ×2 IMPLANT
NS IRRIG 1000ML POUR BTL (IV SOLUTION) ×2 IMPLANT
PACK TOTAL KNEE CUSTOM (KITS) ×2 IMPLANT
PADDING CAST COTTON 6X4 STRL (CAST SUPPLIES) ×4 IMPLANT
PATELLA MEDIAL ATTUN 35MM KNEE (Knees) IMPLANT
PIN STEINMAN FIXATION KNEE (PIN) IMPLANT
PROTECTOR NERVE ULNAR (MISCELLANEOUS) ×2 IMPLANT
SET HNDPC FAN SPRY TIP SCT (DISPOSABLE) ×2 IMPLANT
SUT MNCRL AB 4-0 PS2 18 (SUTURE) ×2 IMPLANT
SUT STRATAFIX 0 PDS 27 VIOLET (SUTURE) ×1
SUT VIC AB 2-0 CT1 TAPERPNT 27 (SUTURE) ×6 IMPLANT
SUTURE STRATFX 0 PDS 27 VIOLET (SUTURE) ×2 IMPLANT
TIBIAL BASE ROT PLAT SZ 3 KNEE (Knees) ×1 IMPLANT
TRAY FOLEY MTR SLVR 16FR STAT (SET/KITS/TRAYS/PACK) IMPLANT
TUBE SUCTION HIGH CAP CLEAR NV (SUCTIONS) ×2 IMPLANT
WATER STERILE IRR 1000ML POUR (IV SOLUTION) ×4 IMPLANT
WRAP KNEE MAXI GEL POST OP (GAUZE/BANDAGES/DRESSINGS) ×2 IMPLANT

## 2023-07-23 NOTE — Evaluation (Addendum)
Physical Therapy Evaluation Patient Details Name: Sullivan Backhus MRN: [id number] DOB: May 22, 1951 Today's Date: 07/23/2023  History of Present Illness  72 yo female presents to therapy s/p R TKA on 07/23/2023 due to failure of conservative measures. Pt PMH includes but is not limited to: depression, AMS, post laminectomy syndrome s/p surgery, chronic back and neck pain, PTSD, cervical ca, CVA, seizure and cholecystectomy.  Clinical Impression   Kace Asebedo is a 72 y.o. female POD 0 s/p R TKA. Patient reports IND with mobility at baseline. Patient is now limited by functional impairments (see PT problem list below) and requires CGA for bed mobility and min A for transfers. Patient was able to ambulate 18  feet with RW and min A and progressing to CGA level of assist and recliner close. Patient instructed in exercise to facilitate ROM and circulation to manage edema. Patient will benefit from continued skilled PT interventions to address impairments and progress towards PLOF. Acute PT will follow to progress mobility and stair training in preparation for safe discharge home with social support, PCA and OPPT services.           If plan is discharge home, recommend the following: A little help with walking and/or transfers;A little help with bathing/dressing/bathroom;Assistance with cooking/housework;Assist for transportation;Help with stairs or ramp for entrance;Supervision due to cognitive status   Can travel by private vehicle        Equipment Recommendations Rolling walker (2 wheels)  Recommendations for Other Services       Functional Status Assessment Patient has had a recent decline in their functional status and demonstrates the ability to make significant improvements in function in a reasonable and predictable amount of time.     Precautions / Restrictions Precautions Precautions: Fall;Knee Restrictions Weight Bearing Restrictions: No      Mobility  Bed Mobility Overal  bed mobility: Needs Assistance Bed Mobility: Supine to Sit     Supine to sit: Contact guard, HOB elevated, Used rails     General bed mobility comments: increased time and cues    Transfers Overall transfer level: Needs assistance Equipment used: Rolling walker (2 wheels) Transfers: Sit to/from Stand Sit to Stand: Min assist, From elevated surface           General transfer comment: cues for safety and sequencing    Ambulation/Gait Ambulation/Gait assistance: Contact guard assist, Min assist Gait Distance (Feet): 18 Feet Assistive device: Rolling walker (2 wheels) Gait Pattern/deviations: Step-to pattern, Antalgic, Trunk flexed Gait velocity: decreased     General Gait Details: pt required min A initally and able to progress to CGA, pt demonstrated vaulting like gait pattern with excessive L hip hiking and poor eccentric control with L LE to floor and pt limiting R stride length and foot clearnance, pt unable to follow cues for step to pattern at time of eval. pt will benefit from ongoing ed and instruction for more normalized pattern  Stairs            Wheelchair Mobility     Tilt Bed    Modified Rankin (Stroke Patients Only)       Balance Overall balance assessment: Needs assistance, History of Falls (1 fall in the past 6 months) Sitting-balance support: Feet supported Sitting balance-Leahy Scale: Fair     Standing balance support: Bilateral upper extremity supported, During functional activity, Reliant on assistive device for balance Standing balance-Leahy Scale: Poor  Pertinent Vitals/Pain Pain Assessment Pain Assessment: 0-10 Pain Score: 9  Pain Location: R knee and leg Pain Descriptors / Indicators: Aching, Constant, Burning, Discomfort, Dull, Operative site guarding Pain Intervention(s): Limited activity within patient's tolerance, Monitored during session, Premedicated before session, Repositioned,  Patient requesting pain meds-RN notified, Ice applied    Home Living Family/patient expects to be discharged to:: Private residence Living Arrangements: Alone Available Help at Discharge: Friend(s) Type of Home: Apartment ([facility] [address] Living apartment) Home Access: Level entry       Home Layout: One level Home Equipment: Grab bars - tub/shower;Grab bars - toilet;Shower seat;Lift chair      Prior Function Prior Level of Function : Needs assist (no longer driving)       Physical Assist : ADLs (physical) (IADLs)   ADLs (physical): IADLs Mobility Comments: pt reports limited with standing and amb tolerance and required social support for IADLs, IND with ADLs, self care tasks and no AD in home setting ADLs Comments: PAC 2.5 hrs 4 days a week and assist with household management. Unclear if pt has cognative deficits at baseline or suspect medications, pt having a difficult time following one step directions, processing for using personal phone and or ordering dinner. impulsive     Extremity/Trunk Assessment        Lower Extremity Assessment Lower Extremity Assessment: RLE deficits/detail RLE Deficits / Details: ankle DF/PF 5/5; SLR first with AA and pt able to maintain with < 10 degree lag, however unable to perform active RLE Sensation: WNL    Cervical / Trunk Assessment Cervical / Trunk Assessment: Normal  Communication   Communication Communication: No apparent difficulties  Cognition Arousal: Alert Behavior During Therapy: Impulsive, WFL for tasks assessed/performed Overall Cognitive Status: Difficult to assess                                 General Comments: unclear if differs from baseline, no family present        General Comments      Exercises Total Joint Exercises Ankle Circles/Pumps: AROM, Both, 10 reps   Assessment/Plan    PT Assessment Patient needs continued PT services  PT Problem List Decreased strength;Decreased range of  motion;Decreased activity tolerance;Decreased balance;Decreased mobility;Decreased coordination;Pain       PT Treatment Interventions DME instruction;Gait training;Functional mobility training;Therapeutic activities;Therapeutic exercise;Balance training;Neuromuscular re-education;Patient/family education;Modalities    PT Goals (Current goals can be found in the Care Plan section)  Acute Rehab PT Goals Patient Stated Goal: to loose weight and be more active, get out of the apartment more for felowship PT Goal Formulation: With patient Time For Goal Achievement: 08/06/23 Potential to Achieve Goals: Good    Frequency 7X/week     Co-evaluation               AM-PAC PT "6 Clicks" Mobility  Outcome Measure Help needed turning from your back to your side while in a flat bed without using bedrails?: A Little Help needed moving from lying on your back to sitting on the side of a flat bed without using bedrails?: A Little Help needed moving to and from a bed to a chair (including a wheelchair)?: A Little Help needed standing up from a chair using your arms (e.g., wheelchair or bedside chair)?: A Little Help needed to walk in hospital room?: A Little Help needed climbing 3-5 steps with a railing? : A Lot 6 Click Score: 17  End of Session Equipment Utilized During Treatment: Gait belt Activity Tolerance: Patient limited by fatigue Patient left: in chair;with call bell/phone within reach;with chair alarm set Nurse Communication: Mobility status PT Visit Diagnosis: Unsteadiness on feet (R26.81);Muscle weakness (generalized) (M62.81);Difficulty in walking, not elsewhere classified (R26.2);Pain;History of falling (Z91.81) Pain - Right/Left: Right Pain - part of body: Knee;Leg    Time: 1650-1720 PT Time Calculation (min) (ACUTE ONLY): 30 min   Charges:   PT Evaluation $PT Eval Low Complexity: 1 Low PT Treatments $Therapeutic Activity: 8-22 mins PT General Charges $$ ACUTE PT  VISIT: 1 Visit         Johnny Bridge, PT Acute Rehab   Jacqualyn Posey 07/23/2023, 5:41 PM

## 2023-07-23 NOTE — Transfer of Care (Signed)
Immediate Anesthesia Transfer of Care Note  Patient: Carla Tran  Procedure(s) Performed: TOTAL KNEE ARTHROPLASTY (Right: Knee)  Patient Location: PACU  Anesthesia Type:Regional and Spinal  Level of Consciousness: awake, alert , and oriented  Airway & Oxygen Therapy: Patient Spontanous Breathing  Post-op Assessment: Report given to RN and Post -op Vital signs reviewed and stable  Post vital signs: Reviewed and stable  Last Vitals:  Vitals Value Taken Time  BP 130/67 07/23/23 1222  Temp    Pulse 74 07/23/23 1223  Resp 15 07/23/23 1223  SpO2 94 % 07/23/23 1223  Vitals shown include unfiled device data.  Last Pain:  Vitals:   07/23/23 0723  TempSrc:   PainSc: 0-No pain         Complications: No notable events documented.

## 2023-07-23 NOTE — Anesthesia Postprocedure Evaluation (Signed)
Anesthesia Post Note  Patient: Carla Tran  Procedure(s) Performed: TOTAL KNEE ARTHROPLASTY (Right: Knee)     Patient location during evaluation: PACU Anesthesia Type: Regional, Spinal and MAC Level of consciousness: awake Pain management: pain level controlled Vital Signs Assessment: post-procedure vital signs reviewed and stable Respiratory status: spontaneous breathing, respiratory function stable and nonlabored ventilation Cardiovascular status: blood pressure returned to baseline and stable Postop Assessment: no headache, no backache and no apparent nausea or vomiting Anesthetic complications: no   No notable events documented.  Last Vitals:  Vitals:   07/23/23 1445 07/23/23 1522  BP: (!) 177/92 (!) 169/95  Pulse: 71 73  Resp: (!) 21 18  Temp: (!) 36.4 C 36.9 C  SpO2: 99% 97%    Last Pain:  Vitals:   07/23/23 1658  TempSrc:   PainSc: 2                  Linton Rump

## 2023-07-23 NOTE — Anesthesia Procedure Notes (Signed)
Spinal  Patient location during procedure: OR Start time: 07/23/2023 11:00 AM End time: 07/23/2023 11:06 AM Reason for block: surgical anesthesia Staffing Performed: anesthesiologist  Anesthesiologist: Linton Rump, MD Performed by: Linton Rump, MD Authorized by: Linton Rump, MD   Preanesthetic Checklist Completed: patient identified, IV checked, site marked, risks and benefits discussed, surgical consent, monitors and equipment checked, pre-op evaluation and timeout performed Spinal Block Patient position: sitting Prep: DuraPrep Patient monitoring: blood pressure and continuous pulse ox Approach: midline Location: L3-4 Injection technique: single-shot Needle Needle type: Pencan  Needle gauge: 24 G Needle length: 9 cm Additional Notes Risks and benefits of neuraxial anesthesia including, but not limited to, infection, bleeding, local anesthetic toxicity, headache, hypotension, back pain, block failure, etc. were discussed with the patient. The patient expressed understanding and consented to the procedure. I confirmed that the patient has no bleeding disorders and is not taking blood thinners. I confirmed the patient's last platelet count with the nurse. Monitors were applied. A time-out was performed immediately prior to the procedure. Sterile technique was used throughout the whole procedure.   3 attempt(s)

## 2023-07-23 NOTE — Op Note (Signed)
OPERATIVE REPORT-TOTAL KNEE ARTHROPLASTY   Pre-operative diagnosis- Osteoarthritis  Right knee(s)  Post-operative diagnosis- Osteoarthritis Right knee(s)  Procedure-  Right  Total Knee Arthroplasty  Surgeon- Gus Rankin. Niklaus Mamaril, MD  Assistant- Arcola Jansky, PA-C   Anesthesia-   Adductor canal block and spinal  EBL-5 mL   Drains None  Tourniquet time-  Total Tourniquet Time Documented: Thigh (Right) - 29 minutes Total: Thigh (Right) - 29 minutes     Complications- None  Condition-PACU - hemodynamically stable.   Brief Clinical Note  Carla Tran is a 72 y.o. year old female with end stage OA of her right knee with progressively worsening pain and dysfunction. She has constant pain, with activity and at rest and significant functional deficits with difficulties even with ADLs. She has had extensive non-op management including analgesics, injections of cortisone and viscosupplements, and home exercise program, but remains in significant pain with significant dysfunction.Radiographs show bone on bone arthritis patellofemoral. She presents now for right Total Knee Arthroplasty.     Procedure in detail---   The patient is brought into the operating room and positioned supine on the operating table. After successful administration of  Adductor canal block and spinal,   a tourniquet is placed high on the  Right thigh(s) and the lower extremity is prepped and draped in the usual sterile fashion. Time out is performed by the operating team and then the  Right lower extremity is wrapped in Esmarch, knee flexed and the tourniquet inflated to 300 mmHg.       A midline incision is made with a ten blade through the subcutaneous tissue to the level of the extensor mechanism. A fresh blade is used to make a medial parapatellar arthrotomy. Soft tissue over the proximal medial tibia is subperiosteally elevated to the joint line with a knife and into the semimembranosus bursa with a Cobb elevator.  Soft tissue over the proximal lateral tibia is elevated with attention being paid to avoiding the patellar tendon on the tibial tubercle. The patella is everted, knee flexed 90 degrees and the ACL and PCL are removed. Findings are bone on bone patellofemoral with exposed lateral bone.        The drill is used to create a starting hole in the distal femur and the canal is thoroughly irrigated with sterile saline to remove the fatty contents. The 5 degree Right  valgus alignment guide is placed into the femoral canal and the distal femoral cutting block is pinned to remove 9 mm off the distal femur. Resection is made with an oscillating saw.      The tibia is subluxed forward and the menisci are removed. The extramedullary alignment guide is placed referencing proximally at the medial aspect of the tibial tubercle and distally along the second metatarsal axis and tibial crest. The block is pinned to remove 2mm off the more deficient medial  side. Resection is made with an oscillating saw. Size 3is the most appropriate size for the tibia and the proximal tibia is prepared with the modular drill and keel punch for that size.      The femoral sizing guide is placed and size 4 is most appropriate. Rotation is marked off the epicondylar axis and confirmed by creating a rectangular flexion gap at 90 degrees. The size 4 cutting block is pinned in this rotation and the anterior, posterior and chamfer cuts are made with the oscillating saw. The intercondylar block is then placed and that cut is made.  Trial size 3 tibial component, trial size 4 narrow posterior stabilized femur and a 7  mm posterior stabilized rotating platform insert trial is placed. Full extension is achieved with excellent varus/valgus and anterior/posterior balance throughout full range of motion. The patella is everted and thickness measured to be 22  mm. Free hand resection is taken to 12 mm, a 35 template is placed, lug holes are drilled, trial  patella is placed, and it tracks normally. Osteophytes are removed off the posterior femur with the trial in place. All trials are removed and the cut bone surfaces prepared with pulsatile lavage. Cement is mixed and once ready for implantation, the size 3 tibial implant, size  4 narrow posterior stabilized femoral component, and the size 35 patella are cemented in place and the patella is held with the clamp. The trial insert is placed and the knee held in full extension. The Exparel (20 ml mixed with 60 ml saline) is injected into the extensor mechanism, posterior capsule, medial and lateral gutters and subcutaneous tissues.  All extruded cement is removed and once the cement is hard the permanent 7 mm posterior stabilized rotating platform insert is placed into the tibial tray.      The wound is copiously irrigated with saline solution and the extensor mechanism closed with # 0 Stratofix suture. The tourniquet is released for a total tourniquet time of 29  minutes. Flexion against gravity is 140 degrees and the patella tracks normally. Subcutaneous tissue is closed with 2.0 vicryl and subcuticular with running 4.0 Monocryl. The incision is cleaned and dried and steri-strips and a bulky sterile dressing are applied. The limb is placed into a knee immobilizer and the patient is awakened and transported to recovery in stable condition.      Please note that a surgical assistant was a medical necessity for this procedure in order to perform it in a safe and expeditious manner. Surgical assistant was necessary to retract the ligaments and vital neurovascular structures to prevent injury to them and also necessary for proper positioning of the limb to allow for anatomic placement of the prosthesis.   Gus Rankin Kanaya Gunnarson, MD    07/23/2023, 11:59 AM

## 2023-07-23 NOTE — Interval H&P Note (Signed)
History and Physical Interval Note:  07/23/2023 6:30 AM  Carla Tran  has presented today for surgery, with the diagnosis of right knee osteoarthritis.  The various methods of treatment have been discussed with the patient and family. After consideration of risks, benefits and other options for treatment, the patient has consented to  Procedure(s): TOTAL KNEE ARTHROPLASTY (Right) as a surgical intervention.  The patient's history has been reviewed, patient examined, no change in status, stable for surgery.  I have reviewed the patient's chart and labs.  Questions were answered to the patient's satisfaction.     Homero Fellers Zyrell Carmean

## 2023-07-23 NOTE — Plan of Care (Signed)
  Problem: Coping: Goal: Level of anxiety will decrease Outcome: Progressing   Problem: Pain Management: Goal: General experience of comfort will improve Outcome: Progressing

## 2023-07-23 NOTE — Progress Notes (Signed)
Orthopedic Tech Progress Note Patient Details:  Carla Tran [birth year] [id number]  Applied CPM CPM Right Knee CPM Right Knee: On Right Knee Flexion (Degrees): 40 Right Knee Extension (Degrees): 10  Post Interventions Patient Tolerated: Well Instructions Provided: Care of device, Adjustment of device  Sherilyn Banker 07/23/2023, 12:31 PM

## 2023-07-23 NOTE — Discharge Instructions (Addendum)
Carla Arabian, MD Total Joint Specialist [facility] [place] [address], [address] [place], [place] [address] [phone]  TOTAL KNEE REPLACEMENT POSTOPERATIVE DIRECTIONS    Knee Rehabilitation, Guidelines Following Surgery  Results after knee surgery are often greatly improved when you follow the exercise, range of motion and muscle strengthening exercises prescribed by your doctor. Safety measures are also important to protect the knee from further injury. If any of these exercises cause you to have increased pain or swelling in your knee joint, decrease the amount until you are comfortable again and slowly increase them. If you have problems or questions, call your caregiver or physical therapist for advice.   BLOOD CLOT PREVENTION Take a 10 mg Xarelto once a day for three weeks following surgery. Then take an 81 mg Aspirin once a day for three weeks. Then discontinue Aspirin. You may resume your vitamins/supplements once you have discontinued the Xarelto. Do not take any NSAIDs (Advil, Aleve, Ibuprofen, Meloxicam, etc.) until you have discontinued the Xarelto.   HOME CARE INSTRUCTIONS  Remove items at home which could result in a fall. This includes throw rugs or furniture in walking pathways.  ICE to the affected knee as much as tolerated. Icing helps control swelling. If the swelling is well controlled you will be more comfortable and rehab easier. Continue to use ice on the knee for pain and swelling from surgery. You may notice swelling that will progress down to the foot and ankle. This is normal after surgery. Elevate the leg when you are not up walking on it.    Continue to use the breathing machine which will help keep your temperature down. It is common for your temperature to cycle up and down following surgery, especially at night when you are not up moving around and exerting yourself. The breathing machine keeps your lungs expanded and your temperature  down. Do not place pillow under the operative knee, focus on keeping the knee straight while resting  DIET You may resume your previous home diet once you are discharged from the hospital.  DRESSING / WOUND CARE / SHOWERING Keep your bulky bandage on for 2 days. On the third post-operative day you may remove the Ace bandage and gauze. There is a waterproof adhesive bandage on your skin which will stay in place until your first follow-up appointment. Once you remove this you will not need to place another bandage You may begin showering 3 days following surgery, but do not submerge the incision under water.  ACTIVITY For the first 5 days, the key is rest and control of pain and swelling Do your home exercises twice a day starting on post-operative day 3. On the days you go to physical therapy, just do the home exercises once that day. You should rest, ice and elevate the leg for 50 minutes out of every hour. Get up and walk/stretch for 10 minutes per hour. After 5 days you can increase your activity slowly as tolerated. Walk with your walker as instructed. Use the walker until you are comfortable transitioning to a cane. Walk with the cane in the opposite hand of the operative leg. You may discontinue the cane once you are comfortable and walking steadily. Avoid periods of inactivity such as sitting longer than an hour when not asleep. This helps prevent blood clots.  You may discontinue the knee immobilizer once you are able to perform a straight leg raise while lying down. You may resume a sexual relationship in one month or  when given the OK by your doctor.  You may return to work once you are cleared by your doctor.  Do not drive a car for 6 weeks or until released by your surgeon.  Do not drive while taking narcotics.  TED HOSE STOCKINGS Wear the elastic stockings on both legs for three weeks following surgery during the day. You may remove them at night for sleeping.  WEIGHT  BEARING Weight bearing as tolerated with assist device (walker, cane, etc) as directed, use it as long as suggested by your surgeon or therapist, typically at least 4-6 weeks.  POSTOPERATIVE CONSTIPATION PROTOCOL Constipation - defined medically as fewer than three stools per week and severe constipation as less than one stool per week.  One of the most common issues patients have following surgery is constipation.  Even if you have a regular bowel pattern at home, your normal regimen is likely to be disrupted due to multiple reasons following surgery.  Combination of anesthesia, postoperative narcotics, change in appetite and fluid intake all can affect your bowels.  In order to avoid complications following surgery, here are some recommendations in order to help you during your recovery period.  Colace (docusate) - Pick up an over-the-counter form of Colace or another stool softener and take twice a day as long as you are requiring postoperative pain medications.  Take with a full glass of water daily.  If you experience loose stools or diarrhea, hold the colace until you stool forms back up. If your symptoms do not get better within 1 week or if they get worse, check with your doctor. Dulcolax (bisacodyl) - Pick up over-the-counter and take as directed by the product packaging as needed to assist with the movement of your bowels.  Take with a full glass of water.  Use this product as needed if not relieved by Colace only.  MiraLax (polyethylene glycol) - Pick up over-the-counter to have on hand. MiraLax is a solution that will increase the amount of water in your bowels to assist with bowel movements.  Take as directed and can mix with a glass of water, juice, soda, coffee, or tea. Take if you go more than two days without a movement. Do not use MiraLax more than once per day. Call your doctor if you are still constipated or irregular after using this medication for 7 days in a row.  If you continue  to have problems with postoperative constipation, please contact the office for further assistance and recommendations.  If you experience "the worst abdominal pain ever" or develop nausea or vomiting, please contact the office immediatly for further recommendations for treatment.  ITCHING If you experience itching with your medications, try taking only a single pain pill, or even half a pain pill at a time.  You can also use Benadryl over the counter for itching or also to help with sleep.   MEDICATIONS See your medication summary on the "After Visit Summary" that the nursing staff will review with you prior to discharge.  You may have some home medications which will be placed on hold until you complete the course of blood thinner medication.  It is important for you to complete the blood thinner medication as prescribed by your surgeon.  Continue your approved medications as instructed at time of discharge.  PRECAUTIONS If you experience chest pain or shortness of breath - call 911 immediately for transfer to the hospital emergency department.  If you develop a fever greater that 101 F, purulent  drainage from wound, increased redness or drainage from wound, foul odor from the wound/dressing, or calf pain - CONTACT YOUR SURGEON.                                                   FOLLOW-UP APPOINTMENTS Make sure you keep all of your appointments after your operation with your surgeon and caregivers. You should call the office at the above phone number and make an appointment for approximately two weeks after the date of your surgery or on the date instructed by your surgeon outlined in the "After Visit Summary".  RANGE OF MOTION AND STRENGTHENING EXERCISES  Rehabilitation of the knee is important following a knee injury or an operation. After just a few days of immobilization, the muscles of the thigh which control the knee become weakened and shrink (atrophy). Knee exercises are designed to build up  the tone and strength of the thigh muscles and to improve knee motion. Often times heat used for twenty to thirty minutes before working out will loosen up your tissues and help with improving the range of motion but do not use heat for the first two weeks following surgery. These exercises can be done on a training (exercise) mat, on the floor, on a table or on a bed. Use what ever works the best and is most comfortable for you Knee exercises include:  Leg Lifts - While your knee is still immobilized in a splint or cast, you can do straight leg raises. Lift the leg to 60 degrees, hold for 3 sec, and slowly lower the leg. Repeat 10-20 times 2-3 times daily. Perform this exercise against resistance later as your knee gets better.  Quad and Hamstring Sets - Tighten up the muscle on the front of the thigh (Quad) and hold for 5-10 sec. Repeat this 10-20 times hourly. Hamstring sets are done by pushing the foot backward against an object and holding for 5-10 sec. Repeat as with quad sets.  Leg Slides: Lying on your back, slowly slide your foot toward your buttocks, bending your knee up off the floor (only go as far as is comfortable). Then slowly slide your foot back down until your leg is flat on the floor again. Angel Wings: Lying on your back spread your legs to the side as far apart as you can without causing discomfort.  A rehabilitation program following serious knee injuries can speed recovery and prevent re-injury in the future due to weakened muscles. Contact your doctor or a physical therapist for more information on knee rehabilitation.   POST-OPERATIVE OPIOID TAPER INSTRUCTIONS: It is important to wean off of your opioid medication as soon as possible. If you do not need pain medication after your surgery it is ok to stop day one. Opioids include: Codeine, Hydrocodone(Norco, Vicodin), Oxycodone(Percocet, oxycontin) and hydromorphone amongst others.  Long term and even short term use of opiods can  cause: Increased pain response Dependence Constipation Depression Respiratory depression And more.  Withdrawal symptoms can include Flu like symptoms Nausea, vomiting And more Techniques to manage these symptoms Hydrate well Eat regular healthy meals Stay active Use relaxation techniques(deep breathing, meditating, yoga) Do Not substitute Alcohol to help with tapering If you have been on opioids for less than two weeks and do not have pain than it is ok to stop all together.  Plan to   wean off of opioids This plan should start within one week post op of your joint replacement. Maintain the same interval or time between taking each dose and first decrease the dose.  Cut the total daily intake of opioids by one tablet each day Next start to increase the time between doses. The last dose that should be eliminated is the evening dose.   IF YOU ARE TRANSFERRED TO A SKILLED REHAB FACILITY If the patient is transferred to a skilled rehab facility following release from the hospital, a list of the current medications will be sent to the facility for the patient to continue.  When discharged from the skilled rehab facility, please have the facility set up the patient's [place] prior to being released. Also, the skilled facility will be responsible for providing the patient with their medications at time of release from the facility to include their pain medication, the muscle relaxants, and their blood thinner medication. If the patient is still at the rehab facility at time of the two week follow up appointment, the skilled rehab facility will also need to assist the patient in arranging follow up appointment in our office and any transportation needs.  MAKE SURE YOU:  Understand these instructions.  Get help right away if you are not doing well or get worse.   DENTAL ANTIBIOTICS:  In most cases prophylactic antibiotics for Dental procdeures after total joint surgery are  not necessary.  Exceptions are as follows:  1. History of prior total joint infection  2. Severely immunocompromised (Organ Transplant, cancer chemotherapy, Rheumatoid biologic medications such as [place])  3. Poorly controlled diabetes (A1C &gt; 8.0, blood glucose over 200)  If you have one of these conditions, contact your surgeon for an antibiotic prescription, prior to your dental procedure.    Pick up stool softner and laxative for home use following surgery while on pain medications. Do not submerge incision under water. Please use good hand washing techniques while changing dressing each day. May shower starting three days after surgery. Please use a clean towel to pat the incision dry following showers. Continue to use ice for pain and swelling after surgery. Do not use any lotions or creams on the incision until instructed by your surgeon.      Information on my medicine - XARELTO (Rivaroxaban)  This medication education was reviewed with me or my healthcare representative as part of my discharge preparation.   Why was Xarelto prescribed for you? Xarelto was prescribed for you to reduce the risk of blood clots forming after orthopedic surgery. The medical term for these abnormal blood clots is venous thromboembolism (VTE).  What do you need to know about xarelto ? Take your Xarelto ONCE DAILY at the same time every day. You may take it either with or without food.  If you have difficulty swallowing the tablet whole, you may crush it and mix in applesauce just prior to taking your dose.  Take Xarelto exactly as prescribed by your doctor and DO NOT stop taking Xarelto without talking to the doctor who prescribed the medication.  Stopping without other VTE prevention medication to take the place of Xarelto may increase your risk of developing a clot.  After discharge, you should have regular check-up appointments with your healthcare provider that is prescribing  your Xarelto.    What do you do if you miss a dose? If you miss a dose, take it as soon as you remember on the same day then continue  your regularly scheduled once daily regimen the next day. Do not take two doses of Xarelto on the same day.   Important Safety Information A possible side effect of Xarelto is bleeding. You should call your healthcare provider right away if you experience any of the following: Bleeding from an injury or your nose that does not stop. Unusual colored urine (red or dark brown) or unusual colored stools (red or black). Unusual bruising for unknown reasons. A serious fall or if you hit your head (even if there is no bleeding).  Some medicines may interact with Xarelto and might increase your risk of bleeding while on Xarelto. To help avoid this, consult your healthcare provider or pharmacist prior to using any new prescription or non-prescription medications, including herbals, vitamins, non-steroidal anti-inflammatory drugs (NSAIDs) and supplements.  This website has more information on Xarelto: [email or web address]

## 2023-07-23 NOTE — Anesthesia Procedure Notes (Signed)
Anesthesia Regional Block: Adductor canal block   Pre-Anesthetic Checklist: , timeout performed,  Correct Patient, Correct Site, Correct Laterality,  Correct Procedure, Correct Position, site marked,  Risks and benefits discussed,  Surgical consent,  Pre-op evaluation,  At surgeon's request and post-op pain management  Laterality: Right  Prep: chloraprep       Needles:  Injection technique: Single-shot  Needle Type: Echogenic Stimulator Needle     Needle Length: 9cm  Needle Gauge: 21     Additional Needles:   Procedures:,,,, ultrasound used (permanent image in chart),,    Narrative:  Start time: 07/23/2023 9:14 AM End time: 07/23/2023 9:16 AM Injection made incrementally with aspirations every 5 mL.  Performed by: Personally  Anesthesiologist: Linton Rump, MD  Additional Notes: Discussed risks and benefits of nerve block including, but not limited to, prolonged and/or permanent nerve injury involving sensory and/or motor function. Monitors were applied and a time-out was performed. The nerve and associated structures were visualized under ultrasound guidance. After negative aspiration, local anesthetic was slowly injected around the nerve. There was no evidence of high pressure during the procedure. There were no paresthesias. VSS remained stable and the patient tolerated the procedure well.

## 2023-07-24 DIAGNOSIS — M1711 Unilateral primary osteoarthritis, right knee: Secondary | ICD-10-CM | POA: Diagnosis not present

## 2023-07-24 LAB — BASIC METABOLIC PANEL
Anion gap: 10 (ref 5–15)
BUN: 10 mg/dL (ref 8–23)
CO2: 27 mmol/L (ref 22–32)
Calcium: 9 mg/dL (ref 8.9–10.3)
Chloride: 101 mmol/L (ref 98–111)
Creatinine, Ser: 0.87 mg/dL (ref 0.44–1.00)
GFR, Estimated: >60 mL/min (ref >60)
Glucose, Bld: 133 mg/dL — ABNORMAL HIGH (ref 70–99)
Potassium: 4.1 mmol/L (ref 3.5–5.1)
Sodium: 138 mmol/L (ref 135–145)

## 2023-07-24 LAB — CBC
HCT: 34.2 % — ABNORMAL LOW (ref 36.0–46.0)
Hemoglobin: 11.2 g/dL — ABNORMAL LOW (ref 12.0–15.0)
MCH: 28.6 pg (ref 26.0–34.0)
MCHC: 32.7 g/dL (ref 30.0–36.0)
MCV: 87.5 fL (ref 80.0–100.0)
Platelets: 258 10*3/uL (ref 150–400)
RBC: 3.91 MIL/uL (ref 3.87–5.11)
RDW: 14.1 % (ref 11.5–15.5)
WBC: 11.2 10*3/uL — ABNORMAL HIGH (ref 4.0–10.5)
nRBC: 0 % (ref 0.0–0.2)

## 2023-07-24 MED ORDER — OXYCODONE HCL 5 MG PO TABS: 5.0000 mg | ORAL_TABLET | Freq: Four times a day (QID) | ORAL | 0 refills | Status: AC | PRN

## 2023-07-24 MED ORDER — FLUOCINONIDE 0.05 % EX SOLN: 1.0000 | Freq: Every day | CUTANEOUS | Status: DC

## 2023-07-24 MED ORDER — RIVAROXABAN 10 MG PO TABS: 10.0000 mg | ORAL_TABLET | Freq: Every day | ORAL | 0 refills | Status: AC

## 2023-07-24 MED ORDER — CYCLOBENZAPRINE HCL 10 MG PO TABS: 10.0000 mg | ORAL_TABLET | Freq: Three times a day (TID) | ORAL | 0 refills | Status: AC | PRN

## 2023-07-24 MED ORDER — LATANOPROST 0.005 % OP SOLN
1.0000 [drp] | Freq: Every day | OPHTHALMIC | Status: DC
  Filled 2023-07-24: qty 2.5

## 2023-07-24 NOTE — Care Management Obs Status (Signed)
MEDICARE OBSERVATION STATUS NOTIFICATION   Patient Details  Name: Carla Tran MRN: [id number] Date of Birth: 04-05-1951   Medicare Observation Status Notification Given:  Yes  verbal  Amada Jupiter, LCSW 07/24/2023, 3:26 PM

## 2023-07-24 NOTE — Progress Notes (Signed)
30 Day PASRR Note   Patient Details  Name: Carla Tran Date of Birth: 1951-03-07   Transition of Care [facility]) CM/SW Contact:    Amada Jupiter, LCSW Phone Number: 07/24/2023, 2:46 PM  To Whom It May Concern:  Please be advised that this patient will require a short-term nursing home stay - anticipated 30 days or less for rehabilitation and strengthening.   The plan is for return home.

## 2023-07-24 NOTE — Progress Notes (Signed)
Patient requested to see the eye drops order by her provider. This writer attempted to administer the eye drops, patient snatched them from my hand and asked to read the label. Patient believes the medication is fake and stated we were trying to poison her. She refused to give the drops back to RN. RN asked another RN to verify the medication for patient, but she asked [place] to leave an said she will show her MD the drops in the morning.

## 2023-07-24 NOTE — Progress Notes (Signed)
   Subjective: 1 Day Post-Op Procedure(s) (LRB): TOTAL KNEE ARTHROPLASTY (Right) Patient reports pain as moderate.   Patient seen in rounds by Dr. Lequita Halt. Patient had increased pain yesterday, doing somewhat better this AM. No other issues. Denies chest pain/SOB. We will continue therapy today.  Objective: Vital signs in last 24 hours: Temp:  [97.5 F (36.4 C)-98.5 F (36.9 C)] 98.3 F (36.8 C) (12/10 0456) Pulse Rate:  [67-77] 77 (12/10 0456) Resp:  [14-22] 17 (12/10 0456) BP: (130-177)/(65-121) 153/73 (12/10 0456) SpO2:  [93 %-100 %] 99 % (12/10 0456)  Intake/Output from previous day:  Intake/Output Summary (Last 24 hours) at 07/24/2023 0845 Last data filed at 07/24/2023 0651 Gross per 24 hour  Intake 3527 ml  Output 3430 ml  Net 97 ml     Intake/Output this shift: No intake/output data recorded.  Labs: Recent Labs    07/24/23 0315  HGB 11.2*   Recent Labs    07/24/23 0315  WBC 11.2*  RBC 3.91  HCT 34.2*  PLT 258   Recent Labs    07/24/23 0315  NA 138  K 4.1  CL 101  CO2 27  BUN 10  CREATININE 0.87  GLUCOSE 133*  CALCIUM 9.0   No results for input(s): "LABPT", "INR" in the last 72 hours.  Exam: General - Patient is Alert and Oriented Extremity - Neurologically intact Neurovascular intact Sensation intact distally Dorsiflexion/Plantar flexion intact Dressing - dressing C/D/I Motor Function - intact, moving foot and toes well on exam.   Past Medical History:  Diagnosis Date   Allergy    Anginal pain (HCC)    Anxiety    Asthma    Bursitis    shoulder   Cataract    forming    Cervical cancer (HCC)    Depression    DJD (degenerative joint disease)    Dyspnea    Dysrhythmia    Endometriosis    Fibromyalgia    Glaucoma    Head ache    Heart murmur    Hypertension    Irregular heart beat    PTSD (post-traumatic stress disorder)    PTSD (post-traumatic stress disorder)    Seizures (HCC)    x 1 only- in early 2000's and none  since    Skin cancer    Stroke (HCC)     Assessment/Plan: 1 Day Post-Op Procedure(s) (LRB): TOTAL KNEE ARTHROPLASTY (Right) Principal Problem:   Osteoarthritis of right knee  Estimated body mass index is 31.45 kg/m as calculated from the following:   Height as of this encounter: 5\' 2"  (1.575 m).   Weight as of this encounter: 78 kg. Advance diet Up with therapy D/C IV fluids    DVT Prophylaxis - Xarelto Weight bearing as tolerated. Continue therapy.  Plan is to go to Skilled nursing facility after hospital stay. Possible discharge tomorrow pending bed availability.  Arther Abbott, PA-C Orthopedic Surgery [phone] 07/24/2023, 8:45 AM

## 2023-07-24 NOTE — [place] FL2 (Signed)
[place] MEDICAID FL2 LEVEL OF CARE FORM     IDENTIFICATION  Patient Name: Carla Tran Birthdate: February 25, 1951 Sex: female Admission Date (Current Location): 07/23/2023  [place] and [place] Number:  Carla Tran [id number] M Facility and Address:  [facility],  [address], [place] [address]      Provider Number: [id number]  Attending Physician Name and Address:  Ollen Gross, MD  Relative Name and Phone Number:  Rayn Woodington (relative) [phone]    Current Level of Care: Hospital Recommended Level of Care: Skilled Nursing Facility Prior Approval Number:    Date Approved/Denied:   PASRR Number: [id number]  Discharge Plan: SNF    Current Diagnoses: Patient Active Problem List   Diagnosis Date Noted   Osteoarthritis of right knee 07/23/2023   Major depressive disorder with single episode, in partial remission (HCC) 07/22/2020   Altered mental status 07/23/2019   Cervical post-laminectomy syndrome 07/23/2019   Cervical spondylosis 07/23/2019   Lumbar facet joint syndrome 07/23/2019   Lumbosacral spondylosis without myelopathy 07/23/2019   Myofascial pain 07/23/2019   Neck pain 07/23/2019   Osteoarthritis of hip 07/23/2019   Unspecified inflammatory spondylopathy, cervical region [facility]) 07/23/2019   Pain of right sacroiliac joint 10/07/2018   Anxiety 05/01/2017   Sleep walking 12/23/2014   Chronic neck and back pain 05/17/2014   Chronic recurrent major depressive disorder (HCC) 05/17/2014   DDD (degenerative disc disease), cervical 05/17/2014   Lumbar disc disease with radiculopathy 05/17/2014   PTSD (post-traumatic stress disorder) 05/17/2014   DIARRHEA 11/17/2009    Orientation RESPIRATION BLADDER Height & Weight     Self, Time, Situation, Place  Normal Continent Weight: 171 lb 15.3 oz (78 kg) Height:  5\' 2"  (157.5 cm)  BEHAVIORAL SYMPTOMS/MOOD NEUROLOGICAL BOWEL NUTRITION STATUS      Continent Diet (regular)  AMBULATORY STATUS  COMMUNICATION OF NEEDS Skin   Limited Assist Verbally Other (Comment) (surgical incision only)                       Personal Care Assistance Level of Assistance  Bathing, Dressing Bathing Assistance: Limited assistance   Dressing Assistance: Limited assistance     Functional Limitations Info  Sight, Hearing, Speech Sight Info: Adequate Hearing Info: Adequate Speech Info: Adequate    SPECIAL CARE FACTORS FREQUENCY  OT (By licensed OT), PT (By licensed PT)     PT Frequency: 5x/wk OT Frequency: 5x/wk            Contractures Contractures Info: Not present    Additional Factors Info  Code Status, Allergies, Psychotropic Code Status Info: Full Allergies Info: Penicillins, Haemophilus Influenzae Vaccines, Mirtazapine, Darvon (Propoxyphene), Sulfonamide Derivatives, Codeine, Ivp Dye (Iodinated Contrast Media) Psychotropic Info: see MAR         Current Medications (07/25/2023):  This is the current hospital active medication list Current Facility-Administered Medications  Medication Dose Route Frequency Provider Last Rate Last Admin   0.9 %  sodium chloride infusion   Intravenous Continuous Eartha Inch, PA   Stopped at 07/24/23 5366   acetaminophen (TYLENOL) tablet 325-650 mg  325-650 mg Oral Q6H PRN Eartha Inch, PA       ALPRAZolam Prudy Feeler) tablet 0.25 mg  0.25 mg Oral Q1200 Aluisio, Homero Fellers, MD   0.25 mg at 07/24/23 1307   ALPRAZolam Prudy Feeler) tablet 0.25 mg  0.25 mg Oral QHS Ollen Gross, MD   0.25 mg at 07/24/23 2136   ALPRAZolam Prudy Feeler) tablet 0.5 mg  0.5 mg Oral Q breakfast Ollen Gross, MD  0.5 mg at 07/25/23 8841   bisacodyl (DULCOLAX) suppository 10 mg  10 mg Rectal Daily PRN Eartha Inch, PA       cyclobenzaprine (FLEXERIL) tablet 10 mg  10 mg Oral TID PRN Eartha Inch, PA   10 mg at 07/24/23 1030   dicyclomine (BENTYL) capsule 10 mg  10 mg Oral BID Eartha Inch, PA   10 mg at 07/25/23 0840   diphenhydrAMINE (BENADRYL) 12.5 MG/5ML  elixir 12.5-25 mg  12.5-25 mg Oral Q4H PRN Eartha Inch, PA   25 mg at 07/25/23 0243   docusate sodium (COLACE) capsule 100 mg  100 mg Oral BID Eartha Inch, PA   100 mg at 07/25/23 0840   escitalopram (LEXAPRO) tablet 10 mg  10 mg Oral Daily Eartha Inch, PA   10 mg at 07/25/23 6606   fluocinonide (LIDEX) 0.05 % external solution 1 Application  1 Application Topical Daily Ollen Gross, MD       gabapentin (NEURONTIN) capsule 300 mg  300 mg Oral BID Ollen Gross, MD   300 mg at 07/25/23 0840   gabapentin (NEURONTIN) capsule 600 mg  600 mg Oral QHS Ollen Gross, MD   600 mg at 07/24/23 2136   HYDROmorphone (DILAUDID) injection 0.5-1 mg  0.5-1 mg Intravenous Q2H PRN Eartha Inch, PA   1 mg at 07/23/23 1740   hydrOXYzine (ATARAX) tablet 25 mg  25 mg Oral Daily Ollen Gross, MD   25 mg at 07/25/23 3016   hydrOXYzine (ATARAX) tablet 50 mg  50 mg Oral QHS Ollen Gross, MD   50 mg at 07/24/23 2137   ipratropium (ATROVENT) 0.06 % nasal spray 2 spray  2 spray Each Nare BID Eartha Inch, PA   2 spray at 07/25/23 0840   latanoprost (XALATAN) 0.005 % ophthalmic solution 1 drop  1 drop Both Eyes QHS Aluisio, Homero Fellers, MD       menthol-cetylpyridinium (CEPACOL) lozenge 3 mg  1 lozenge Oral PRN Eartha Inch, PA       Or   phenol (CHLORASEPTIC) mouth spray 1 spray  1 spray Mouth/Throat PRN Eartha Inch, PA       metoCLOPramide (REGLAN) tablet 5-10 mg  5-10 mg Oral Q8H PRN Eartha Inch, PA       Or   metoCLOPramide (REGLAN) injection 5-10 mg  5-10 mg Intravenous Q8H PRN Eartha Inch, PA       montelukast (SINGULAIR) tablet 10 mg  10 mg Oral Daily Eartha Inch, PA   10 mg at 07/25/23 0838   ondansetron (ZOFRAN) tablet 4 mg  4 mg Oral Q6H PRN Eartha Inch, PA       Or   ondansetron (ZOFRAN) injection 4 mg  4 mg Intravenous Q6H PRN Eartha Inch, PA       oxyCODONE (Oxy IR/ROXICODONE) immediate release tablet 10-15 mg  10-15 mg Oral Q4H PRN Eartha Inch, PA   10 mg at 07/25/23 0242   oxyCODONE (Oxy IR/ROXICODONE) immediate release tablet 5-10 mg  5-10 mg Oral Q4H PRN Eartha Inch, PA   10 mg at 07/25/23 0839   polyethylene glycol (MIRALAX / GLYCOLAX) packet 17 g  17 g Oral Daily PRN Eartha Inch, PA   17 g at 07/24/23 1031   pregabalin (LYRICA) capsule 25 mg  25 mg Oral TID Eartha Inch, PA   25 mg at 07/25/23 0109   rivaroxaban (XARELTO) tablet 10 mg  10 mg Oral Q breakfast Eartha Inch, PA   10 mg at 07/25/23 0840   sodium  phosphate (FLEET) enema 1 enema  1 enema Rectal Once PRN Eartha Inch, PA         Discharge Medications: Please see discharge summary for a list of discharge medications.  Relevant Imaging Results:  Relevant Lab Results:   Additional Information SS# [id number]  Amada Jupiter, LCSW

## 2023-07-24 NOTE — TOC Initial Note (Signed)
Transition of Care [facility]) - Initial/Assessment Note    Patient Details  Name: Carla Tran MRN: [id number] Date of Birth: 12-10-1950  Transition of Care [facility]) CM/SW Contact:    Amada Jupiter, LCSW Phone Number: 07/24/2023, 2:38 PM  Clinical Narrative:                  Met with pt today to review dc needs.  Pt reports that she does have a few hours per week of assistance, however, not 24/7.  Had initially planned for home dc but, due to need for more assistance, plan has changed to SNF rehab.  She is hopeful to secure rehab at [facility] in [place].  Will reach out to this facility and others to begin bed search.  Expected Discharge Plan: Skilled Nursing Facility Barriers to Discharge: SNF Pending bed offer, Insurance Authorization   Patient Goals and CMS Choice Patient states their goals for this hospitalization and ongoing recovery are:: return home following SNF rehab          Expected Discharge Plan and Services In-house Referral: Clinical Social Work   Post Acute Care Choice: Skilled Nursing Facility Living arrangements for the past 2 months: Single Family Home                 DME Arranged: N/A DME Agency: NA                  Prior Living Arrangements/Services Living arrangements for the past 2 months: Single Family Home Lives with:: Self Patient language and need for interpreter reviewed:: Yes Do you feel safe going back to the place where you live?: Yes      Need for Family Participation in Patient Care: Yes (Comment) Care giver support system in place?: No (comment) Current home services: Homehealth aide Criminal Activity/Legal Involvement Pertinent to Current Situation/Hospitalization: No - Comment as needed  Activities of Daily Living   ADL Screening (condition at time of admission) Independently performs ADLs?: Yes (appropriate for developmental age) Is the patient deaf or have difficulty hearing?: No Does the patient have difficulty seeing, even  when wearing glasses/contacts?: No Does the patient have difficulty concentrating, remembering, or making decisions?: No  Permission Sought/Granted Permission sought to share information with : Family Supports Permission granted to share information with : Yes, Verbal Permission Granted  Share Information with NAME: relative, Reshunda Urrego @ [phone]           Emotional Assessment Appearance:: Appears stated age Attitude/Demeanor/Rapport: Gracious, Engaged Affect (typically observed): Accepting, Pleasant Orientation: : Oriented to Self, Oriented to Place, Oriented to  Time, Oriented to Situation Alcohol / Substance Use: Not Applicable Psych Involvement: No (comment)  Admission diagnosis:  Osteoarthritis of right knee [M17.11] Patient Active Problem List   Diagnosis Date Noted   Osteoarthritis of right knee 07/23/2023   Major depressive disorder with single episode, in partial remission (HCC) 07/22/2020   Altered mental status 07/23/2019   Cervical post-laminectomy syndrome 07/23/2019   Cervical spondylosis 07/23/2019   Lumbar facet joint syndrome 07/23/2019   Lumbosacral spondylosis without myelopathy 07/23/2019   Myofascial pain 07/23/2019   Neck pain 07/23/2019   Osteoarthritis of hip 07/23/2019   Unspecified inflammatory spondylopathy, cervical region (HCC) 07/23/2019   Pain of right sacroiliac joint 10/07/2018   Anxiety 05/01/2017   Sleep walking 12/23/2014   Chronic neck and back pain 05/17/2014   Chronic recurrent major depressive disorder (HCC) 05/17/2014   DDD (degenerative disc disease), cervical 05/17/2014   Lumbar disc disease with radiculopathy  05/17/2014   PTSD (post-traumatic stress disorder) 05/17/2014   DIARRHEA 11/17/2009   PCP:  Marvis Repress, MD Pharmacy:   [facility] [address] - [place], [place] - [address] [place] [address] Phone: [phone] Fax: [phone]  [facility] [place], [place] - [address] [place] [address] Phone: [phone] Fax: [phone]     Social Determinants of Health (SDOH) Social History: SDOH Screenings   Food Insecurity: No Food Insecurity (07/23/2023)  Housing: Patient Declined (07/23/2023)  Transportation Needs: Unmet Transportation Needs (07/23/2023)  Utilities: Not At Risk (07/23/2023)  Financial Resource Strain: Medium Risk (06/06/2023)   Received from [facility]  Physical Activity: Sufficiently Active (06/06/2023)   Received from [facility]  Social Connections: Socially Isolated (06/06/2023)   Received from [facility]  Stress: Stress Concern Present (06/06/2023)   Received from [facility]  Tobacco Use: Low Risk  (07/23/2023)   SDOH Interventions:     Readmission Risk Interventions     No data to display

## 2023-07-24 NOTE — Progress Notes (Signed)
Physical Therapy Treatment Patient Details Name: Carla Tran MRN: [id number] DOB: [birth year] Today's Date: 07/24/2023   History of Present Illness 72 yo female presents to therapy s/p R TKA on 07/23/2023 due to failure of conservative measures. Pt PMH includes but is not limited to: depression, AMS, post laminectomy syndrome s/p surgery, chronic back and neck pain, PTSD, cervical ca, CVA, seizure and cholecystectomy.    PT Comments  POD # 1 pm session Pt back in bed with MAX c/o fatigue.  Performed TE's in bed followed by ICE. Pt will need ST Rehab at SNF to address mobility and functional decline prior to safely returning home.    If plan is discharge home, recommend the following: A little help with walking and/or transfers;A little help with bathing/dressing/bathroom;Assistance with cooking/housework;Assist for transportation;Help with stairs or ramp for entrance;Supervision due to cognitive status   Can travel by private vehicle        Equipment Recommendations  Rolling walker (2 wheels)    Recommendations for Other Services       Precautions / Restrictions          Balance                                            Cognition Arousal: Alert Behavior During Therapy: Impulsive, WFL for tasks assessed/performed Overall Cognitive Status: Difficult to assess                                 General Comments: AxO x 3 improved cognition from eval.  Also impulsive, hyper talkativce and easily distracted.  Requirted repeat VC's to stay on task and safety with using walker.  At times difficult to understand.        Exercises  Total Knee Replacement TE's following HEP handout 10 reps B LE ankle pumps 05 reps towel squeezes 05 reps knee presses 05 reps heel slides  05 reps SAQ's 05 reps SLR's 05 reps ABD Educated on use of gait belt to assist with TE's Followed by ICE     General Comments        Pertinent Vitals/Pain Pain  Assessment Pain Assessment: 0-10 Pain Score: 8  Pain Location: R knee and leg Pain Descriptors / Indicators: Aching, Constant, Burning, Discomfort, Dull, Operative site guarding Pain Intervention(s): Monitored during session, Premedicated before session, Repositioned, Ice applied    Home Living                          Prior Function            PT Goals (current goals can now be found in the care plan section) Progress towards PT goals: Progressing toward goals    Frequency    7X/week      PT Plan      Co-evaluation              AM-PAC PT "6 Clicks" Mobility   Outcome Measure  Help needed turning from your back to your side while in a flat bed without using bedrails?: A Little Help needed moving from lying on your back to sitting on the side of a flat bed without using bedrails?: A Little Help needed moving to and from a bed to a chair (including a wheelchair)?: A Little Help needed standing up  from a chair using your arms (e.g., wheelchair or bedside chair)?: A Little Help needed to walk in hospital room?: A Little Help needed climbing 3-5 steps with a railing? : A Lot 6 Click Score: 17    End of Session Equipment Utilized During Treatment: Gait belt Activity Tolerance: Patient limited by pain Patient left: in bed;with call bell/phone within reach;with bed alarm set Nurse Communication: Mobility status PT Visit Diagnosis: Unsteadiness on feet (R26.81);Muscle weakness (generalized) (M62.81);Difficulty in walking, not elsewhere classified (R26.2);Pain;History of falling (Z91.81) Pain - Right/Left: Right Pain - part of body: Knee;Leg     Time: 1353-1411 PT Time Calculation (min) (ACUTE ONLY): 18 min  Charges:    $Gait Training: 8-22 mins $Therapeutic Activity: 8-22 mins PT General Charges $$ ACUTE PT VISIT: 1 Visit                     Felecia Shelling  PTA Acute  Rehabilitation Services Office M-F          [phone]

## 2023-07-24 NOTE — Progress Notes (Signed)
Physical Therapy Treatment Patient Details Name: Carla Tran MRN: [id number] DOB: March 03, 1951 Today's Date: 07/24/2023   History of Present Illness 72 yo female presents to therapy s/p R TKA on 07/23/2023 due to failure of conservative measures. Pt PMH includes but is not limited to: depression, AMS, post laminectomy syndrome s/p surgery, chronic back and neck pain, PTSD, cervical ca, CVA, seizure and cholecystectomy.    PT Comments  POD # 1 am session General Comments: AxO x 3 improved cognition from eval.  Also impulsive, hyper talkativce and easily distracted.  Requirted repeat VC's to stay on task and safety with using walker.  At times difficult to understand. Assisted OOB to amb was difficult. General bed mobility comments: pt able to self guide LE off bed with increased time.  General transfer comment: 50% VC's on proper hand placemewnt, LE placement as well as safety with turn completion.  Pt easily ditracted.  Unsteady esp with turns.  Pt attempted to pick walker up from ground and perform a quick turn.  Max VC's on safety and direction.  HOGH FALL RISK  General Gait Details: 75% VC's on proper walker to self distance, upright posture and to avoid "picking walker up off floor".  Gait pattern improved from eval with ledd Hip hick.  Limited distance due to increased pain. Then returned to room to perform some TE's following HEP handout.  Instructed on proper tech, freq as well as use of ICE.  Limited due to increased pain. Will see Pt again this afternoon for gait training and TE's. Pt lives home alone.  Ortho MD has indicated D/C plan is for SNF.      If plan is discharge home, recommend the following: A little help with walking and/or transfers;A little help with bathing/dressing/bathroom;Assistance with cooking/housework;Assist for transportation;Help with stairs or ramp for entrance;Supervision due to cognitive status   Can travel by private vehicle        Equipment Recommendations   Rolling walker (2 wheels)    Recommendations for Other Services       Precautions / Restrictions Precautions Precautions: Fall;Knee Precaution Comments: no pillow under knee Restrictions Weight Bearing Restrictions: No RLE Weight Bearing: Weight bearing as tolerated     Mobility  Bed Mobility Overal bed mobility: Needs Assistance Bed Mobility: Supine to Sit     Supine to sit: Supervision, Contact guard     General bed mobility comments: pt able to self guide LE off bed with increased time    Transfers Overall transfer level: Needs assistance Equipment used: Rolling walker (2 wheels) Transfers: Sit to/from Stand Sit to Stand: Contact guard assist, Min assist           General transfer comment: 50% VC's on proper hand placemewnt, LE placement as well as safety with turn completion.  Pt easily ditracted.  Unsteady esp with turns.  Pt attempted to pick walker up from ground and perform a quick turn.  Max VC's on safety and direction.  HOGH FALL RISK    Ambulation/Gait Ambulation/Gait assistance: Contact guard assist, Min assist Gait Distance (Feet): 34 Feet Assistive device: Rolling walker (2 wheels) Gait Pattern/deviations: Step-to pattern, Antalgic, Trunk flexed       General Gait Details: 75% VC's on proper walker to self distance, upright posture and to avoid "picking walker up off floor".  Gait pattern improved from eval with ledd Hip hick.  Limited distance due to increased pain.   Stairs  Wheelchair Mobility     Tilt Bed    Modified Rankin (Stroke Patients Only)       Balance                                            Cognition Arousal: Alert Behavior During Therapy: Impulsive, WFL for tasks assessed/performed Overall Cognitive Status: Difficult to assess                                 General Comments: AxO x 3 improved cognition from eval.  Also impulsive, hyper talkativce and easily  distracted.  Requirted repeat VC's to stay on task and safety with using walker.  At times difficult to understand.        Exercises  Total Knee Replacement TE's following HEP handout 10 reps B LE ankle pumps 05 reps towel squeezes 05 reps knee presses 05 reps heel slides  Educated on use of gait belt to assist with TE's Followed by ICE     General Comments        Pertinent Vitals/Pain Pain Assessment Pain Assessment: 0-10 Pain Score: 8  Pain Location: R knee and leg Pain Descriptors / Indicators: Aching, Constant, Burning, Discomfort, Dull, Operative site guarding Pain Intervention(s): Monitored during session, Premedicated before session, Repositioned, Ice applied    Home Living                          Prior Function            PT Goals (current goals can now be found in the care plan section) Progress towards PT goals: Progressing toward goals    Frequency    7X/week      PT Plan      Co-evaluation              AM-PAC PT "6 Clicks" Mobility   Outcome Measure  Help needed turning from your back to your side while in a flat bed without using bedrails?: A Little Help needed moving from lying on your back to sitting on the side of a flat bed without using bedrails?: A Little Help needed moving to and from a bed to a chair (including a wheelchair)?: A Little Help needed standing up from a chair using your arms (e.g., wheelchair or bedside chair)?: A Little Help needed to walk in hospital room?: A Little Help needed climbing 3-5 steps with a railing? : A Lot 6 Click Score: 17    End of Session Equipment Utilized During Treatment: Gait belt Activity Tolerance: Patient limited by pain Patient left: in chair;with call bell/phone within reach;with chair alarm set Nurse Communication: Mobility status PT Visit Diagnosis: Unsteadiness on feet (R26.81);Muscle weakness (generalized) (M62.81);Difficulty in walking, not elsewhere classified  (R26.2);Pain;History of falling (Z91.81) Pain - Right/Left: Right Pain - part of body: Knee;Leg     Time: 4540-9811 PT Time Calculation (min) (ACUTE ONLY): 34 min  Charges:    $Gait Training: 8-22 mins $Therapeutic Activity: 8-22 mins PT General Charges $$ ACUTE PT VISIT: 1 Visit                     Felecia Shelling  PTA Acute  Rehabilitation Services Office M-F          [phone]

## 2023-07-25 ENCOUNTER — Encounter (HOSPITAL_COMMUNITY): Payer: Self-pay | Admitting: Orthopedic Surgery

## 2023-07-25 DIAGNOSIS — M1711 Unilateral primary osteoarthritis, right knee: Secondary | ICD-10-CM | POA: Diagnosis not present

## 2023-07-25 LAB — CBC
HCT: 32.8 % — ABNORMAL LOW (ref 36.0–46.0)
Hemoglobin: 11 g/dL — ABNORMAL LOW (ref 12.0–15.0)
MCH: 29.1 pg (ref 26.0–34.0)
MCHC: 33.5 g/dL (ref 30.0–36.0)
MCV: 86.8 fL (ref 80.0–100.0)
Platelets: 320 10*3/uL (ref 150–400)
RBC: 3.78 MIL/uL — ABNORMAL LOW (ref 3.87–5.11)
RDW: 14.4 % (ref 11.5–15.5)
WBC: 10.8 10*3/uL — ABNORMAL HIGH (ref 4.0–10.5)
nRBC: 0 % (ref 0.0–0.2)

## 2023-07-25 NOTE — Progress Notes (Signed)
Called [facility] and report given to nurse Selena Batten.

## 2023-07-25 NOTE — Plan of Care (Signed)
  Problem: Activity: Goal: Risk for activity intolerance will decrease Outcome: Progressing   Problem: Safety: Goal: Ability to remain free from injury will improve Outcome: Progressing   Problem: Pain Management: Goal: General experience of comfort will improve Outcome: Progressing

## 2023-07-25 NOTE — TOC Transition Note (Signed)
Transition of Tran [facility]) - CM/SW Discharge Note   Patient Details  Name: Carla Tran MRN: [id number] Date of Birth: 09/26/1950  Transition of Tran [facility]) CM/SW Contact:  Amada Jupiter, LCSW Phone Number: 07/25/2023, 11:58 AM   Clinical Narrative:     Have reviewed SNF bed offers with pt and bed accepted at [facility] who can admit today.  Have secured insurance authorization as well.  MD/ PA aware and pt medically cleared for dc today.  PTAR has been set up for 2pm pick up time.  RN to call report to [phone].  No further TOC needs.  Final next level of Tran: Skilled Nursing Facility Barriers to Discharge: Barriers Resolved   Patient Goals and CMS Choice      Discharge Placement                Patient chooses bed at: [facility] Patient to be transferred to facility by: PTAR Name of family member notified: friend, Patsy Patient and family notified of of transfer: 07/25/23  Discharge Plan and Services Additional resources added to the After Visit Summary for   In-house Referral: Clinical Social Work   Post Acute Tran Choice: Skilled Nursing Facility          DME Arranged: N/A DME Agency: NA                  Social Determinants of Health (SDOH) Interventions SDOH Screenings   Food Insecurity: No Food Insecurity (07/23/2023)  Housing: Patient Declined (07/23/2023)  Transportation Needs: Unmet Transportation Needs (07/23/2023)  Utilities: Not At Risk (07/23/2023)  Financial Resource Strain: Medium Risk (06/06/2023)   Received from [facility]  Physical Activity: Sufficiently Active (06/06/2023)   Received from [facility]  Social Connections: Socially Isolated (06/06/2023)   Received from [facility]  Stress: Stress Concern Present (06/06/2023)   Received from [facility]  Tobacco Use: Low Risk  (07/23/2023)     Readmission Risk Interventions     No data to display

## 2023-07-25 NOTE — Discharge Summary (Cosign Needed Addendum)
Physician Discharge Summary   Patient ID: Carla Tran MRN: [id number] DOB/AGE: [birth year] 72 y.o.  Admit date: 07/23/2023 Discharge date: 07/25/2023  Primary Diagnosis: Osteoarthritis, right knee   Admission Diagnoses:  Past Medical History:  Diagnosis Date   Allergy    Anginal pain (HCC)    Anxiety    Asthma    Bursitis    shoulder   Cataract    forming    Cervical cancer (HCC)    Depression    DJD (degenerative joint disease)    Dyspnea    Dysrhythmia    Endometriosis    Fibromyalgia    Glaucoma    Head ache    Heart murmur    Hypertension    Irregular heart beat    PTSD (post-traumatic stress disorder)    PTSD (post-traumatic stress disorder)    Seizures (HCC)    x 1 only- in early 2000's and none since    Skin cancer    Stroke [facility])    Discharge Diagnoses:   Principal Problem:   Osteoarthritis of right knee  Estimated body mass index is 31.45 kg/m as calculated from the following:   Height as of this encounter: 5\' 2"  (1.575 m).   Weight as of this encounter: 78 kg.  Procedure:  Procedure(s) (LRB): TOTAL KNEE ARTHROPLASTY (Right)   Consults: None  HPI: Carla Tran is a 72 y.o. year old female with end stage OA of her right knee with progressively worsening pain and dysfunction. She has constant pain, with activity and at rest and significant functional deficits with difficulties even with ADLs. She has had extensive non-op management including analgesics, injections of cortisone and viscosupplements, and home exercise program, but remains in significant pain with significant dysfunction.Radiographs show bone on bone arthritis patellofemoral. She presents now for right Total Knee Arthroplasty.   Laboratory Data: Admission on 07/23/2023  Component Date Value Ref Range Status   Hgb A1c MFr Bld 07/23/2023 5.6  4.8 - 5.6 % Final   Comment: (NOTE) Pre diabetes:          5.7%-6.4%  Diabetes:              >6.4%  Glycemic control for   <7.0% adults with  diabetes    Mean Plasma Glucose 07/23/2023 114.02  mg/dL Final   Performed at [facility], [address], [place], [place] [address]   WBC 07/24/2023 11.2 (H)  4.0 - 10.5 K/uL Final   RBC 07/24/2023 3.91  3.87 - 5.11 MIL/uL Final   Hemoglobin 07/24/2023 11.2 (L)  12.0 - 15.0 g/dL Final   HCT 19/14/7829 34.2 (L)  36.0 - 46.0 % Final   MCV 07/24/2023 87.5  80.0 - 100.0 fL Final   MCH 07/24/2023 28.6  26.0 - 34.0 pg Final   MCHC 07/24/2023 32.7  30.0 - 36.0 g/dL Final   RDW 56/21/3086 14.1  11.5 - 15.5 % Final   Platelets 07/24/2023 258  150 - 400 K/uL Final   nRBC 07/24/2023 0.0  0.0 - 0.2 % Final   Performed at [facility], [address] [place], [place] [address]   Sodium 07/24/2023 138  135 - 145 mmol/L Final   Potassium 07/24/2023 4.1  3.5 - 5.1 mmol/L Final   Chloride 07/24/2023 101  98 - 111 mmol/L Final   CO2 07/24/2023 27  22 - 32 mmol/L Final   Glucose, Bld 07/24/2023 133 (H)  70 - 99 mg/dL Final   Glucose reference range applies  only to samples taken after fasting for at least 8 hours.   BUN 07/24/2023 10  8 - 23 mg/dL Final   Creatinine, Ser 07/24/2023 0.87  0.44 - 1.00 mg/dL Final   Calcium 16/05/9603 9.0  8.9 - 10.3 mg/dL Final   GFR, Estimated 07/24/2023 >60  >60 mL/min Final   Comment: (NOTE) Calculated using the CKD-EPI Creatinine Equation (2021)    Anion gap 07/24/2023 10  5 - 15 Final   Performed at [facility], [address], [place], [place] [address]   WBC 07/25/2023 10.8 (H)  4.0 - 10.5 K/uL Final   RBC 07/25/2023 3.78 (L)  3.87 - 5.11 MIL/uL Final   Hemoglobin 07/25/2023 11.0 (L)  12.0 - 15.0 g/dL Final   HCT 11/91/4782 32.8 (L)  36.0 - 46.0 % Final   MCV 07/25/2023 86.8  80.0 - 100.0 fL Final   MCH 07/25/2023 29.1  26.0 - 34.0 pg Final   MCHC 07/25/2023 33.5  30.0 - 36.0 g/dL Final   RDW 95/62/1308 14.4  11.5 - 15.5 % Final   Platelets 07/25/2023 320  150 - 400 K/uL Final   nRBC 07/25/2023 0.0  0.0 - 0.2 %  Final   Performed at [facility], [address], [place], [place] [address]  Hospital Outpatient Visit on 07/10/2023  Component Date Value Ref Range Status   WBC 07/10/2023 6.3  4.0 - 10.5 K/uL Final   RBC 07/10/2023 4.49  3.87 - 5.11 MIL/uL Final   Hemoglobin 07/10/2023 12.8  12.0 - 15.0 g/dL Final   HCT 69/62/9528 39.2  36.0 - 46.0 % Final   MCV 07/10/2023 87.3  80.0 - 100.0 fL Final   MCH 07/10/2023 28.5  26.0 - 34.0 pg Final   MCHC 07/10/2023 32.7  30.0 - 36.0 g/dL Final   RDW 41/32/4401 14.2  11.5 - 15.5 % Final   Platelets 07/10/2023 276  150 - 400 K/uL Final   nRBC 07/10/2023 0.0  0.0 - 0.2 % Final   Performed at [facility], [address] [place], [place] [address]   Sodium 07/10/2023 141  135 - 145 mmol/L Final   Potassium 07/10/2023 4.2  3.5 - 5.1 mmol/L Final   Chloride 07/10/2023 98  98 - 111 mmol/L Final   CO2 07/10/2023 26  22 - 32 mmol/L Final   Glucose, Bld 07/10/2023 152 (H)  70 - 99 mg/dL Final   Glucose reference range applies only to samples taken after fasting for at least 8 hours.   BUN 07/10/2023 14  8 - 23 mg/dL Final   Creatinine, Ser 07/10/2023 1.03 (H)  0.44 - 1.00 mg/dL Final   Calcium 36/64/4034 9.8  8.9 - 10.3 mg/dL Final   GFR, Estimated 07/10/2023 58 (L)  >60 mL/min Final   Comment: (NOTE) Calculated using the CKD-EPI Creatinine Equation (2021)    Anion gap 07/10/2023 17 (H)  5 - 15 Final   Performed at [facility], [address], [place], [place] [address]   MRSA, PCR 07/10/2023 NEGATIVE  NEGATIVE Final   Staphylococcus aureus 07/10/2023 NEGATIVE  NEGATIVE Final   Comment: (NOTE) The Xpert SA Assay (FDA approved for NASAL specimens in patients 27 years of age and older), is one component of a comprehensive surveillance program. It is not intended to diagnose infection nor to guide or monitor treatment. Performed at [facility], [address], [place], [place]  [address]      X-Rays:No  results found.  EKG: Orders placed or performed during the hospital encounter of 07/10/23   EKG 12 lead per protocol   EKG 12-Lead   EKG 12-Lead   EKG 12 lead per protocol     Hospital Course: Carla Tran is a 72 y.o. who was admitted to [facility]. They were brought to the operating room on 07/23/2023 and underwent Procedure(s): TOTAL KNEE ARTHROPLASTY.  Patient tolerated the procedure well and was later transferred to the recovery room and then to the orthopaedic floor for postoperative care. They were given PO and IV analgesics for pain control following their surgery. They were given 24 hours of postoperative antibiotics of  Anti-infectives (From admission, onward)    Start     Dose/Rate Route Frequency Ordered Stop   07/23/23 1700  ceFAZolin (ANCEF) IVPB 2g/100 mL premix        2 g 200 mL/hr over 30 Minutes Intravenous Every 6 hours 07/23/23 1526 07/23/23 2333   07/23/23 0645  ceFAZolin (ANCEF) IVPB 2g/100 mL premix        2 g 200 mL/hr over 30 Minutes Intravenous On call to O.R. 07/23/23 1610 07/23/23 1115     and started on DVT prophylaxis in the form of Xarelto.   PT and OT were ordered for total joint protocol. Discharge planning consulted to help with post-op disposition and equipment needs. Patient had a fair night on the evening of surgery. They started to get up OOB with physical therapy on POD #0. Continued to work with physical therapy into POD #2. Patient was seen during rounds on day two and was ready to discharge pending progress with physical therapy and SNF placement. Patient worked with physical therapy for a total of 3 sessions and was meeting their goals. Dressing was changed and the incision was C/D/I.  They were discharged to SNF later that day in stable condition.  Diet: Regular diet Activity: WBAT Follow-up: in 2 weeks Disposition: Skilled nursing facility Discharged Condition: stable   Discharge Instructions     Call MD  / Call 911   Complete by: As directed    If you experience chest pain or shortness of breath, CALL 911 and be transported to the hospital emergency room.  If you develope a fever above 101 F, pus (white drainage) or increased drainage or redness at the wound, or calf pain, call your surgeon's office.   Change dressing   Complete by: As directed    You may remove the bulky bandage (ACE wrap and gauze) two days after surgery. You will have an adhesive waterproof bandage underneath. Leave this in place until your first follow-up appointment.   Constipation Prevention   Complete by: As directed    Drink plenty of fluids.  Prune juice may be helpful.  You may use a stool softener, such as Colace (over the counter) 100 mg twice a day.  Use MiraLax (over the counter) for constipation as needed.   Diet - low sodium heart healthy   Complete by: As directed    Do not put a pillow under the knee. Place it under the heel.   Complete by: As directed    Driving restrictions   Complete by: As directed    No driving for two weeks   Post-operative opioid taper instructions:   Complete by: As directed    POST-OPERATIVE OPIOID TAPER INSTRUCTIONS: It is important to wean off of your opioid medication as soon as possible. If you do not need pain medication  after your surgery it is ok to stop day one. Opioids include: Codeine, Hydrocodone(Norco, Vicodin), Oxycodone(Percocet, oxycontin) and hydromorphone amongst others.  Long term and even short term use of opiods can cause: Increased pain response Dependence Constipation Depression Respiratory depression And more.  Withdrawal symptoms can include Flu like symptoms Nausea, vomiting And more Techniques to manage these symptoms Hydrate well Eat regular healthy meals Stay active Use relaxation techniques(deep breathing, meditating, yoga) Do Not substitute Alcohol to help with tapering If you have been on opioids for less than two weeks and do not have  pain than it is ok to stop all together.  Plan to wean off of opioids This plan should start within one week post op of your joint replacement. Maintain the same interval or time between taking each dose and first decrease the dose.  Cut the total daily intake of opioids by one tablet each day Next start to increase the time between doses. The last dose that should be eliminated is the evening dose.      TED hose   Complete by: As directed    Use stockings (TED hose) for three weeks on both leg(s).  You may remove them at night for sleeping.   Weight bearing as tolerated   Complete by: As directed       Allergies as of 07/25/2023       Reactions   Penicillins Anaphylaxis   Haemophilus Influenzae Vaccines Rash   Mirtazapine Other (See Comments)   Pt states this medication made her sleep walk.   Darvon [propoxyphene] Other (See Comments)   Unknown reaction   Sulfonamide Derivatives Other (See Comments)   Stomach Spasms    Codeine Itching, Rash   Ivp Dye [iodinated Contrast Media] Rash        Medication List     TAKE these medications    ALPRAZolam 0.25 MG tablet Commonly known as: XANAX 1 TID What changed:  how much to take how to take this when to take this additional instructions   cyclobenzaprine 10 MG tablet Commonly known as: FLEXERIL Take 1 tablet (10 mg total) by mouth 3 (three) times daily as needed for muscle spasms.   dextromethorphan-guaiFENesin 30-600 MG 12hr tablet Commonly known as: MUCINEX DM Take 1 tablet by mouth 2 (two) times daily.   dicyclomine 10 MG capsule Commonly known as: BENTYL Take 10 mg by mouth 2 (two) times daily.   escitalopram 10 MG tablet Commonly known as: Lexapro Take 1 tablet (10 mg total) by mouth daily.   fluocinonide 0.05 % external solution Commonly known as: LIDEX Apply 1 Application topically daily.   gabapentin 300 MG capsule Commonly known as: NEURONTIN 1 qam  1 q midday 2 qhs   hydrOXYzine 25 MG  capsule Commonly known as: Vistaril 1 qam  2  qhs   ipratropium 0.06 % nasal spray Commonly known as: ATROVENT Place 2 sprays into both nostrils 2 (two) times daily.   latanoprost 0.005 % ophthalmic solution Commonly known as: XALATAN Place 1 drop into both eyes at bedtime.   montelukast 10 MG tablet Commonly known as: SINGULAIR Take 10 mg by mouth daily.   multivitamin with minerals tablet Take 1 tablet by mouth daily.   oxyCODONE 5 MG immediate release tablet Commonly known as: Oxy IR/ROXICODONE Take 1-2 tablets (5-10 mg total) by mouth every 6 (six) hours as needed for severe pain (pain score 7-10).   Pataday 0.2 % Soln Generic drug: Olopatadine HCl Place 1 drop into both eyes  2 (two) times daily.   pregabalin 25 MG capsule Commonly known as: LYRICA Take 25 mg by mouth 3 (three) times daily.   rivaroxaban 10 MG Tabs tablet Commonly known as: XARELTO Take 1 tablet (10 mg total) by mouth daily with breakfast for 19 days. Start taking on: July 26, 2023   Skyrizi 150 MG/ML Sosy prefilled syringe Generic drug: risankizumab-rzaa Inject 150 mg into the skin every 3 (three) months.               Discharge Care Instructions  (From admission, onward)           Start     Ordered   07/25/23 0000  Weight bearing as tolerated        07/25/23 1109   07/25/23 0000  Change dressing       Comments: You may remove the bulky bandage (ACE wrap and gauze) two days after surgery. You will have an adhesive waterproof bandage underneath. Leave this in place until your first follow-up appointment.   07/25/23 1109            Follow-up Information     Ollen Gross, MD. Schedule an appointment as soon as possible for a visit in 2 week(s).   Specialty: Orthopedic Surgery Contact information: [address] [place] [address] [phone]                 Signed: R. Arcola Jansky, PA-C Orthopedic Surgery 07/25/2023, 11:09 AM

## 2023-07-25 NOTE — Progress Notes (Signed)
Physical Therapy Treatment Patient Details Name: Carla Tran MRN: [id number] DOB: February 17, 1951 Today's Date: 07/25/2023   History of Present Illness 72 yo female presents to therapy s/p R TKA on 07/23/2023 due to failure of conservative measures. Pt PMH includes but is not limited to: depression, AMS, post laminectomy syndrome s/p surgery, chronic back and neck pain, PTSD, cervical ca, CVA, seizure and cholecystectomy.    PT Comments  Pt was seen in bed. Pt seemed more confused than yesterday. Pt performed supine to sit with supervision, pt asked me to hold the gait belt around her R LE when sitting up. Pt performed sit to stand from slightly elevated bed with RW and CGA. Pt ambulated 60 ft in hallway with RW and CGA, cueing for safe walker distance. She had decreased step length and an antalgic gait. Once back to the room pt performed stand to sit onto bed with an uncontrolled descent. Pt was cued to be careful and to sit down slowly. Pt required mod assist + 2 to perform sit to supine, assist to bring Les onto bed and to move pt towards HOB. Pt stated she has been doing some of her total joint exercises. LPT recommends SNF.   If plan is discharge home, recommend the following: A little help with walking and/or transfers;A little help with bathing/dressing/bathroom;Assistance with cooking/housework;Assist for transportation;Help with stairs or ramp for entrance;Supervision due to cognitive status   Can travel by private vehicle        Equipment Recommendations  Rolling walker (2 wheels)    Recommendations for Other Services       Precautions / Restrictions Precautions Precautions: Fall;Knee Precaution Comments: no pillow under knee Restrictions Weight Bearing Restrictions: No RLE Weight Bearing: Weight bearing as tolerated     Mobility  Bed Mobility Overal bed mobility: Needs Assistance Bed Mobility: Supine to Sit, Sit to Supine     Supine to sit: CGA, HOB elevated, Used  rails Sit to supine: Mod assist, +2 for physical assistance, HOB elevated   General bed mobility comments: CGA with supine to sit, did not require assist from therapist, just cueing and redirecting to sit up and put feet on floor, pt used gait belt around ankle to move LE off EOB. Pt required mod assist + 2 to perform sit to supine to bring LEs onto bed and scoot up in bed.    Transfers Overall transfer level: Needs assistance Equipment used: Rolling walker (2 wheels) Transfers: Sit to/from Stand Sit to Stand: Contact guard assist           General transfer comment: Very impulsive, uses walker to pull herself up. Pt moved R LE forward upon standing without cueing from therapist. Pt "flops" when sitting. Min assist when standing from bed to reposition with RW. Overall pt erformed transfers better today.    Ambulation/Gait Ambulation/Gait assistance: Contact guard assist, Min assist Gait Distance (Feet): 60 Feet Assistive device: Rolling walker (2 wheels) Gait Pattern/deviations: Antalgic, Trunk flexed, Step-through pattern, Step-to pattern, Wide base of support Gait velocity: decreased     General Gait Details: Cues to keep walker closer, pt getting better with turns, CGA for safetly, increased distance, pt became slightlySOB during the last 10 ft but it resolved once seated on EOB.   Stairs             Wheelchair Mobility     Tilt Bed    Modified Rankin (Stroke Patients Only)       Balance  Cognition Arousal: Alert Behavior During Therapy: Impulsive, WFL for tasks assessed/performed Overall Cognitive Status: Difficult to assess                                 General Comments: A&O x 2, little more confused today. Very impulsive, gets distracted easily. Mentions things that she thinks happened, but has not.        Exercises      General Comments        Pertinent Vitals/Pain  Pain Assessment Pain Assessment: Faces Faces Pain Scale: Hurts even more Pain Location: R knee and leg Pain Descriptors / Indicators: Aching, Constant, Burning, Discomfort, Dull, Operative site guarding Pain Intervention(s): Monitored during session, Repositioned    Home Living                          Prior Function            PT Goals (current goals can now be found in the care plan section) Acute Rehab PT Goals Patient Stated Goal: to loose weight and be more active, get out of the apartment more for felowship PT Goal Formulation: With patient Time For Goal Achievement: 08/06/23 Progress towards PT goals: Progressing toward goals    Frequency    7X/week      PT Plan      Co-evaluation              AM-PAC PT "6 Clicks" Mobility   Outcome Measure  Help needed turning from your back to your side while in a flat bed without using bedrails?: A Little Help needed moving from lying on your back to sitting on the side of a flat bed without using bedrails?: A Little Help needed moving to and from a bed to a chair (including a wheelchair)?: A Little Help needed standing up from a chair using your arms (e.g., wheelchair or bedside chair)?: A Little Help needed to walk in hospital room?: A Little Help needed climbing 3-5 steps with a railing? : A Lot 6 Click Score: 17    End of Session Equipment Utilized During Treatment: Gait belt Activity Tolerance: Patient tolerated treatment well Patient left: in bed;with call bell/phone within reach;with bed alarm set Nurse Communication: Mobility status PT Visit Diagnosis: Unsteadiness on feet (R26.81);Muscle weakness (generalized) (M62.81);Difficulty in walking, not elsewhere classified (R26.2);Pain;History of falling (Z91.81) Pain - Right/Left: Right Pain - part of body: Knee;Leg     Time: 1610-9604 PT Time Calculation (min) (ACUTE ONLY): 14 min  Charges:    $Gait Training: 8-22 mins PT General Charges $$  ACUTE PT VISIT: 1 Visit           Lazaro Arms, SPTA 07/25/2023, 11:49 AM

## 2023-07-25 NOTE — Progress Notes (Signed)
   Subjective: 2 Days Post-Op Procedure(s) (LRB): TOTAL KNEE ARTHROPLASTY (Right) Patient seen in rounds by Dr. Lequita Halt. Patient reports pain as moderate.  Improving.  Patient is well, and has had no acute complaints or problems. Denies SOB or chest pain. Denies calf pain.  Objective: Vital signs in last 24 hours: Temp:  [97.6 F (36.4 C)-98.5 F (36.9 C)] 97.6 F (36.4 C) (12/11 1610) Pulse Rate:  [81-83] 82 (12/11 0608) Resp:  [14-18] 18 (12/11 0608) BP: (122-144)/(60-81) 139/73 (12/11 0608) SpO2:  [93 %-100 %] 100 % (12/11 9604)  Intake/Output from previous day:  Intake/Output Summary (Last 24 hours) at 07/25/2023 0806 Last data filed at 07/25/2023 0600 Gross per 24 hour  Intake 1240 ml  Output --  Net 1240 ml    Intake/Output this shift: No intake/output data recorded.  Labs: Recent Labs    07/24/23 0315  HGB 11.2*   Recent Labs    07/24/23 0315  WBC 11.2*  RBC 3.91  HCT 34.2*  PLT 258   Recent Labs    07/24/23 0315  NA 138  K 4.1  CL 101  CO2 27  BUN 10  CREATININE 0.87  GLUCOSE 133*  CALCIUM 9.0   No results for input(s): "LABPT", "INR" in the last 72 hours.  Exam: General - Patient is Alert and Oriented Extremity - Neurologically intact Neurovascular intact Sensation intact distally Dorsiflexion/Plantar flexion intact Dressing/Incision - clean, dry, no drainage Motor Function - intact, moving foot and toes well on exam.  Past Medical History:  Diagnosis Date   Allergy    Anginal pain (HCC)    Anxiety    Asthma    Bursitis    shoulder   Cataract    forming    Cervical cancer (HCC)    Depression    DJD (degenerative joint disease)    Dyspnea    Dysrhythmia    Endometriosis    Fibromyalgia    Glaucoma    Head ache    Heart murmur    Hypertension    Irregular heart beat    PTSD (post-traumatic stress disorder)    PTSD (post-traumatic stress disorder)    Seizures (HCC)    x 1 only- in early 2000's and none since    Skin  cancer    Stroke (HCC)     Assessment/Plan: 2 Days Post-Op Procedure(s) (LRB): TOTAL KNEE ARTHROPLASTY (Right) Principal Problem:   Osteoarthritis of right knee  Estimated body mass index is 31.45 kg/m as calculated from the following:   Height as of this encounter: 5\' 2"  (1.575 m).   Weight as of this encounter: 78 kg.  DVT Prophylaxis - Xarelto Weight-bearing as tolerated.  Continue physical therapy. Discharge to SNF once placement obtained.  Alfonzo Feller, PA-C Orthopedic Surgery 07/25/2023, 8:06 AM

## 2023-07-26 ENCOUNTER — Ambulatory Visit: Payer: 59 | Attending: Student | Admitting: Physical Therapy

## 2023-07-26 DIAGNOSIS — R600 Localized edema: Secondary | ICD-10-CM | POA: Insufficient documentation

## 2023-07-26 DIAGNOSIS — M25561 Pain in right knee: Secondary | ICD-10-CM | POA: Insufficient documentation

## 2023-07-26 DIAGNOSIS — M25661 Stiffness of right knee, not elsewhere classified: Secondary | ICD-10-CM | POA: Insufficient documentation

## 2023-08-09 ENCOUNTER — Ambulatory Visit: Payer: 59 | Admitting: Physical Therapy

## 2023-08-14 ENCOUNTER — Ambulatory Visit: Payer: 59

## 2023-08-14 ENCOUNTER — Other Ambulatory Visit: Payer: Self-pay

## 2023-08-14 DIAGNOSIS — M25661 Stiffness of right knee, not elsewhere classified: Secondary | ICD-10-CM

## 2023-08-14 DIAGNOSIS — R600 Localized edema: Secondary | ICD-10-CM | POA: Diagnosis present

## 2023-08-14 DIAGNOSIS — M25561 Pain in right knee: Secondary | ICD-10-CM

## 2023-08-14 NOTE — Therapy (Signed)
 OUTPATIENT PHYSICAL THERAPY LOWER EXTREMITY EVALUATION   Patient Name: Carla Tran MRN: [id number] DOB:1951-01-06, 72 y.o., female Today's Date: 08/14/2023  END OF SESSION:  PT End of Session - 08/14/23 0946     Visit Number 1    Number of Visits 12    Date for PT Re-Evaluation 10/12/23    PT Start Time 0947   Patient arrived late to her appointment.   PT Stop Time 1018    PT Time Calculation (min) 31 min    Activity Tolerance Patient limited by pain    Behavior During Therapy WFL for tasks assessed/performed             Past Medical History:  Diagnosis Date   Allergy    Anginal pain (HCC)    Anxiety    Asthma    Bursitis    shoulder   Cataract    forming    Cervical cancer (HCC)    Depression    DJD (degenerative joint disease)    Dyspnea    Dysrhythmia    Endometriosis    Fibromyalgia    Glaucoma    Head ache    Heart murmur    Hypertension    Irregular heart beat    PTSD (post-traumatic stress disorder)    PTSD (post-traumatic stress disorder)    Seizures (HCC)    x 1 only- in early 2000's and none since    Skin cancer    Stroke [facility])    Past Surgical History:  Procedure Laterality Date   BACK SURGERY     CERVICAL FUSION  2000   C4-5   CHOLECYSTECTOMY     COLONOSCOPY     ligaments removed from elbow     PARTIAL HYSTERECTOMY     SKIN CANCER EXCISION     skin cancer removed     from face x 2, abdomen and arm left    TENDON REPAIR Right    TOTAL KNEE ARTHROPLASTY Right 07/23/2023   Procedure: TOTAL KNEE ARTHROPLASTY;  Surgeon: Melodi Lerner, MD;  Location: [facility];  Service: Orthopedics;  Laterality: Right;   TUBAL LIGATION     Patient Active Problem List   Diagnosis Date Noted   Osteoarthritis of right knee 07/23/2023   Major depressive disorder with single episode, in partial remission (HCC) 07/22/2020   Altered mental status 07/23/2019   Cervical post-laminectomy syndrome 07/23/2019   Cervical spondylosis 07/23/2019   Lumbar facet  joint syndrome 07/23/2019   Lumbosacral spondylosis without myelopathy 07/23/2019   Myofascial pain 07/23/2019   Neck pain 07/23/2019   Osteoarthritis of hip 07/23/2019   Unspecified inflammatory spondylopathy, cervical region (HCC) 07/23/2019   Pain of right sacroiliac joint 10/07/2018   Anxiety 05/01/2017   Sleep walking 12/23/2014   Chronic neck and back pain 05/17/2014   Chronic recurrent major depressive disorder (HCC) 05/17/2014   DDD (degenerative disc disease), cervical 05/17/2014   Lumbar disc disease with radiculopathy 05/17/2014   PTSD (post-traumatic stress disorder) 05/17/2014   DIARRHEA 11/17/2009   REFERRING PROVIDER: Kristian Stabs, PA   REFERRING DIAG: Osteoarthritis of right knee   THERAPY DIAG:  Acute pain of right knee  Stiffness of right knee, not elsewhere classified  Localized edema  Rationale for Evaluation and Treatment: Rehabilitation  ONSET DATE: 07/23/23  SUBJECTIVE:   SUBJECTIVE STATEMENT: Patient reports that she had a right knee replacement on 07/23/23. She notes that it has been horrible since surgery as she has fallen and she cannot sleep. She has not had  any physical therapy since her surgery. She has been trying to do some exercise in bed.   PERTINENT HISTORY: Allergies, osteoarthritis, anxiety, chronic neck and back pain, PTSD, depression, asthma, history of cancer, fibromyalgia, glaucoma, history of a CVA, and history of seizures PAIN:  Are you having pain? Yes: NPRS scale: 10/10 Pain location: right hip radiating down to her knee Pain description: constant deep pain Aggravating factors: getting into and out of tub Relieving factors: heat   PRECAUTIONS: Fall  RED FLAGS: None   WEIGHT BEARING RESTRICTIONS: No  FALLS:  Has patient fallen in last 6 months? Yes. Number of falls 3+; she slipped at home and fell on her butt last week  LIVING ENVIRONMENT: Lives with: lives alone; has an aide that comes for 2.5 hours 3 times per  week Lives in: House/apartment Stairs: No Has following equipment at home: Wheelchair (manual)  OCCUPATION: disabled  PLOF: Independent  PATIENT GOALS: be able to walk, and reduced pain  NEXT MD VISIT: none scheduled  OBJECTIVE:  Note: Objective measures were completed at Evaluation unless otherwise noted.  COGNITION: Overall cognitive status: Within functional limits for tasks assessed     SENSATION: Patient reports numbness on her right lateral knee since surgery.   EDEMA:  Circumferential: R tibiofemoral joint line: 44.7 cm Left tibiofemoral joint line: 41.5 cm  PALPATION: TTP: right IT band and medial/lateral joint lines  LOWER EXTREMITY ROM:  Active ROM Right eval Left eval  Hip flexion    Hip extension    Hip abduction    Hip adduction    Hip internal rotation    Hip external rotation    Knee flexion 86/106 (PROM) 133  Knee extension 26 1  Ankle dorsiflexion    Ankle plantarflexion    Ankle inversion    Ankle eversion     (Blank rows = not tested)  LOWER EXTREMITY MMT: not tested due to surgical condition  GAIT: Assistive device utilized: Wheelchair (manual) Level of assistance: Total A                                                                                                                              TREATMENT DATE:     PATIENT EDUCATION:  Education details: POC, healing, objective findings, prognosis, and goals for physical therapy Person educated: Patient Education method: Explanation Education comprehension: verbalized understanding  HOME EXERCISE PROGRAM:   ASSESSMENT:  CLINICAL IMPRESSION: Patient is a 72 y.o. female who was seen today for physical therapy evaluation and treatment following a right total knee arthroplasty on 07/23/23. She presented with high pain severity and irritability with right knee active range of motion being the most aggravating to her familiar symptoms. She also exhibited significant active and passive  range of motions limitations due to pain and stiffness in her right knee. Recommend that she continue with skilled physical therapy to address her impairments to maximize her functional mobility.    OBJECTIVE IMPAIRMENTS: Abnormal gait,  decreased activity tolerance, decreased balance, decreased mobility, difficulty walking, decreased ROM, decreased strength, hypomobility, increased edema, impaired sensation, impaired tone, and pain.   ACTIVITY LIMITATIONS: carrying, lifting, standing, squatting, sleeping, stairs, transfers, bed mobility, dressing, and locomotion level  PARTICIPATION LIMITATIONS: meal prep, cleaning, laundry, driving, shopping, and community activity  PERSONAL FACTORS: Past/current experiences, Time since onset of injury/illness/exacerbation, Transportation, and 3+ comorbidities: Allergies, osteoarthritis, anxiety, chronic neck and back pain, PTSD, depression, asthma, history of cancer, fibromyalgia, glaucoma, history of a CVA, and history of seizures  are also affecting patient's functional outcome.   REHAB POTENTIAL: Fair    CLINICAL DECISION MAKING: Evolving/moderate complexity  EVALUATION COMPLEXITY: Moderate   GOALS: Goals reviewed with patient? Yes  LONG TERM GOALS: Target date: 09/11/23  Patient will be independent with her HEP.  Baseline:  Goal status: INITIAL  2.  Patient will be able to demonstrate at least 120 degrees of right knee flexion for improved function navigating steps.  Baseline:  Goal status: INITIAL  3.  Patient will improve her right knee extension to within 5 degrees of neutral for improved gait mechanics.  Baseline:  Goal status: INITIAL  4.  Patient will be able to ambulate at least 80 feet with a walker or the least restrictive assistive device for improved household mobility.  Baseline:  Goal status: INITIAL  5.  Patient will be able to perform a straight leg raise on the right lower extremity without an extension lag greater than 5  degrees for improved quadriceps strength.  Baseline:  Goal status: INITIAL  PLAN:  PT FREQUENCY: 2-3x/week  PT DURATION: 4 weeks  PLANNED INTERVENTIONS: 97164- PT Re-evaluation, 97110-Therapeutic exercises, 97530- Therapeutic activity, 97112- Neuromuscular re-education, 97535- Self Care, [id number]- Manual therapy, [phone]- Gait training, 97014- Electrical stimulation (unattended), 97016- Vasopneumatic device, Patient/Family education, Balance training, Stair training, Joint mobilization, Cryotherapy, and Moist heat  PLAN FOR NEXT SESSION: Nustep, heel slides, quad sets, manual therapy, provide HEP, and modalities as needed   Lacinda JAYSON Fass, PT 08/14/2023, 12:32 PM

## 2023-08-17 ENCOUNTER — Ambulatory Visit: Payer: 59 | Attending: Student

## 2023-08-17 DIAGNOSIS — M25561 Pain in right knee: Secondary | ICD-10-CM | POA: Diagnosis present

## 2023-08-17 DIAGNOSIS — R600 Localized edema: Secondary | ICD-10-CM | POA: Insufficient documentation

## 2023-08-17 DIAGNOSIS — M25661 Stiffness of right knee, not elsewhere classified: Secondary | ICD-10-CM | POA: Insufficient documentation

## 2023-08-17 NOTE — Therapy (Signed)
 OUTPATIENT PHYSICAL THERAPY LOWER EXTREMITY TREATMENT   Patient Name: Carla Tran MRN: [id number] DOB:02-Feb-1951, 73 y.o., female Today's Date: 08/17/2023  END OF SESSION:  PT End of Session - 08/17/23 0937     Visit Number 2    Number of Visits 12    Date for PT Re-Evaluation 10/12/23    PT Start Time 0930    PT Stop Time 0945    PT Time Calculation (min) 15 min    Activity Tolerance Patient limited by pain    Behavior During Therapy [facility] for tasks assessed/performed             Past Medical History:  Diagnosis Date   Allergy    Anginal pain (HCC)    Anxiety    Asthma    Bursitis    shoulder   Cataract    forming    Cervical cancer (HCC)    Depression    DJD (degenerative joint disease)    Dyspnea    Dysrhythmia    Endometriosis    Fibromyalgia    Glaucoma    Head ache    Heart murmur    Hypertension    Irregular heart beat    PTSD (post-traumatic stress disorder)    PTSD (post-traumatic stress disorder)    Seizures (HCC)    x 1 only- in early 2000's and none since    Skin cancer    Stroke [facility])    Past Surgical History:  Procedure Laterality Date   BACK SURGERY     CERVICAL FUSION  2000   C4-5   CHOLECYSTECTOMY     COLONOSCOPY     ligaments removed from elbow     PARTIAL HYSTERECTOMY     SKIN CANCER EXCISION     skin cancer removed     from face x 2, abdomen and arm left    TENDON REPAIR Right    TOTAL KNEE ARTHROPLASTY Right 07/23/2023   Procedure: TOTAL KNEE ARTHROPLASTY;  Surgeon: Melodi Lerner, MD;  Location: [facility];  Service: Orthopedics;  Laterality: Right;   TUBAL LIGATION     Patient Active Problem List   Diagnosis Date Noted   Osteoarthritis of right knee 07/23/2023   Major depressive disorder with single episode, in partial remission (HCC) 07/22/2020   Altered mental status 07/23/2019   Cervical post-laminectomy syndrome 07/23/2019   Cervical spondylosis 07/23/2019   Lumbar facet joint syndrome 07/23/2019   Lumbosacral  spondylosis without myelopathy 07/23/2019   Myofascial pain 07/23/2019   Neck pain 07/23/2019   Osteoarthritis of hip 07/23/2019   Unspecified inflammatory spondylopathy, cervical region (HCC) 07/23/2019   Pain of right sacroiliac joint 10/07/2018   Anxiety 05/01/2017   Sleep walking 12/23/2014   Chronic neck and back pain 05/17/2014   Chronic recurrent major depressive disorder (HCC) 05/17/2014   DDD (degenerative disc disease), cervical 05/17/2014   Lumbar disc disease with radiculopathy 05/17/2014   PTSD (post-traumatic stress disorder) 05/17/2014   DIARRHEA 11/17/2009   REFERRING PROVIDER: Kristian Stabs, PA   REFERRING DIAG: Osteoarthritis of right knee   THERAPY DIAG:  Acute pain of right knee  Localized edema  Stiffness of right knee, not elsewhere classified  Rationale for Evaluation and Treatment: Rehabilitation  ONSET DATE: 07/23/23  SUBJECTIVE:   SUBJECTIVE STATEMENT: Patient reports 8/10 right knee pain.  Pt reports having a IBS attack today and is unable to perform a lot of motion.    PERTINENT HISTORY: Allergies, osteoarthritis, anxiety, chronic neck and back pain, PTSD, depression, asthma,  history of cancer, fibromyalgia, glaucoma, history of a CVA, and history of seizures PAIN:  Are you having pain? Yes: NPRS scale: 8/10 Pain location: right hip radiating down to her knee Pain description: constant deep pain Aggravating factors: getting into and out of tub Relieving factors: heat   PRECAUTIONS: Fall  RED FLAGS: None   WEIGHT BEARING RESTRICTIONS: No  FALLS:  Has patient fallen in last 6 months? Yes. Number of falls 3+; she slipped at home and fell on her butt last week  LIVING ENVIRONMENT: Lives with: lives alone; has an aide that comes for 2.5 hours 3 times per week Lives in: House/apartment Stairs: No Has following equipment at home: Wheelchair (manual)  OCCUPATION: disabled  PLOF: Independent  PATIENT GOALS: be able to walk, and  reduced pain  NEXT MD VISIT: none scheduled  OBJECTIVE:  Note: Objective measures were completed at Evaluation unless otherwise noted.  COGNITION: Overall cognitive status: Within functional limits for tasks assessed     SENSATION: Patient reports numbness on her right lateral knee since surgery.   EDEMA:  Circumferential: R tibiofemoral joint line: 44.7 cm Left tibiofemoral joint line: 41.5 cm  PALPATION: TTP: right IT band and medial/lateral joint lines  LOWER EXTREMITY ROM:  Active ROM Right eval Left eval  Hip flexion    Hip extension    Hip abduction    Hip adduction    Hip internal rotation    Hip external rotation    Knee flexion 86/106 (PROM) 133  Knee extension 26 1  Ankle dorsiflexion    Ankle plantarflexion    Ankle inversion    Ankle eversion     (Blank rows = not tested)  LOWER EXTREMITY MMT: not tested due to surgical condition  GAIT: Assistive device utilized: Wheelchair (manual) Level of assistance: Total A                                                                                                                              TREATMENT DATE:                    08/17/23                   EXERCISE LOG  Exercise Repetitions and Resistance Comments  LAQs 15 reps   Seated Marches 15 reps   Heel Slides 15 reps            Blank cell = exercise not performed today    PATIENT EDUCATION:  Education details: POC, healing, objective findings, prognosis, and goals for physical therapy Person educated: Patient Education method: Explanation Education comprehension: verbalized understanding  HOME EXERCISE PROGRAM:   ASSESSMENT:  CLINICAL IMPRESSION: Pt arrives for today's treatment session reporting 8/10 right knee pain.  Pt reports having an IBS attack today and is unable to perform a lot of movement.  Pt unable to be positioned in supine due to stomach issues.  Pt reports performing  HEP from hospital while at home.  Pt eager to get off of RW.   Pt educated on the importance of performing exercises and being as active as possible.  Pt requested session to end early due to stomach issues.  Pt denied any change in pain at completion of today's treatment session.   OBJECTIVE IMPAIRMENTS: Abnormal gait, decreased activity tolerance, decreased balance, decreased mobility, difficulty walking, decreased ROM, decreased strength, hypomobility, increased edema, impaired sensation, impaired tone, and pain.   ACTIVITY LIMITATIONS: carrying, lifting, standing, squatting, sleeping, stairs, transfers, bed mobility, dressing, and locomotion level  PARTICIPATION LIMITATIONS: meal prep, cleaning, laundry, driving, shopping, and community activity  PERSONAL FACTORS: Past/current experiences, Time since onset of injury/illness/exacerbation, Transportation, and 3+ comorbidities: Allergies, osteoarthritis, anxiety, chronic neck and back pain, PTSD, depression, asthma, history of cancer, fibromyalgia, glaucoma, history of a CVA, and history of seizures  are also affecting patient's functional outcome.   REHAB POTENTIAL: Fair    CLINICAL DECISION MAKING: Evolving/moderate complexity  EVALUATION COMPLEXITY: Moderate   GOALS: Goals reviewed with patient? Yes  LONG TERM GOALS: Target date: 09/11/23  Patient will be independent with her HEP.  Baseline:  Goal status: INITIAL  2.  Patient will be able to demonstrate at least 120 degrees of right knee flexion for improved function navigating steps.  Baseline:  Goal status: INITIAL  3.  Patient will improve her right knee extension to within 5 degrees of neutral for improved gait mechanics.  Baseline:  Goal status: INITIAL  4.  Patient will be able to ambulate at least 80 feet with a walker or the least restrictive assistive device for improved household mobility.  Baseline:  Goal status: INITIAL  5.  Patient will be able to perform a straight leg raise on the right lower extremity without an extension  lag greater than 5 degrees for improved quadriceps strength.  Baseline:  Goal status: INITIAL  PLAN:  PT FREQUENCY: 2-3x/week  PT DURATION: 4 weeks  PLANNED INTERVENTIONS: 97164- PT Re-evaluation, 97110-Therapeutic exercises, 97530- Therapeutic activity, 97112- Neuromuscular re-education, 97535- Self Care, [id number]- Manual therapy, [phone]- Gait training, 97014- Electrical stimulation (unattended), 97016- Vasopneumatic device, Patient/Family education, Balance training, Stair training, Joint mobilization, Cryotherapy, and Moist heat  PLAN FOR NEXT SESSION: Nustep, heel slides, quad sets, manual therapy, provide HEP, and modalities as needed   Delon DELENA Gosling, PTA 08/17/2023, 9:56 AM

## 2023-08-21 ENCOUNTER — Ambulatory Visit: Payer: 59 | Admitting: *Deleted

## 2023-08-21 ENCOUNTER — Encounter: Payer: Self-pay | Admitting: *Deleted

## 2023-08-21 DIAGNOSIS — M25661 Stiffness of right knee, not elsewhere classified: Secondary | ICD-10-CM

## 2023-08-21 DIAGNOSIS — M25561 Pain in right knee: Secondary | ICD-10-CM

## 2023-08-21 DIAGNOSIS — R600 Localized edema: Secondary | ICD-10-CM

## 2023-08-21 NOTE — Therapy (Signed)
 OUTPATIENT PHYSICAL THERAPY LOWER EXTREMITY TREATMENT   Patient Name: Carla Tran MRN: [id number] DOB:Oct 06, 1950, 73 y.o., female Today's Date: 08/21/2023  END OF SESSION:  PT End of Session - 08/21/23 0936     Visit Number 3    Number of Visits 12    Date for PT Re-Evaluation 10/12/23    PT Start Time 0930    PT Stop Time 1025    PT Time Calculation (min) 55 min             Past Medical History:  Diagnosis Date   Allergy    Anginal pain (HCC)    Anxiety    Asthma    Bursitis    shoulder   Cataract    forming    Cervical cancer (HCC)    Depression    DJD (degenerative joint disease)    Dyspnea    Dysrhythmia    Endometriosis    Fibromyalgia    Glaucoma    Head ache    Heart murmur    Hypertension    Irregular heart beat    PTSD (post-traumatic stress disorder)    PTSD (post-traumatic stress disorder)    Seizures (HCC)    x 1 only- in early 2000's and none since    Skin cancer    Stroke [facility])    Past Surgical History:  Procedure Laterality Date   BACK SURGERY     CERVICAL FUSION  2000   C4-5   CHOLECYSTECTOMY     COLONOSCOPY     ligaments removed from elbow     PARTIAL HYSTERECTOMY     SKIN CANCER EXCISION     skin cancer removed     from face x 2, abdomen and arm left    TENDON REPAIR Right    TOTAL KNEE ARTHROPLASTY Right 07/23/2023   Procedure: TOTAL KNEE ARTHROPLASTY;  Surgeon: Melodi Lerner, MD;  Location: [facility];  Service: Orthopedics;  Laterality: Right;   TUBAL LIGATION     Patient Active Problem List   Diagnosis Date Noted   Osteoarthritis of right knee 07/23/2023   Major depressive disorder with single episode, in partial remission (HCC) 07/22/2020   Altered mental status 07/23/2019   Cervical post-laminectomy syndrome 07/23/2019   Cervical spondylosis 07/23/2019   Lumbar facet joint syndrome 07/23/2019   Lumbosacral spondylosis without myelopathy 07/23/2019   Myofascial pain 07/23/2019   Neck pain 07/23/2019   Osteoarthritis  of hip 07/23/2019   Unspecified inflammatory spondylopathy, cervical region (HCC) 07/23/2019   Pain of right sacroiliac joint 10/07/2018   Anxiety 05/01/2017   Sleep walking 12/23/2014   Chronic neck and back pain 05/17/2014   Chronic recurrent major depressive disorder (HCC) 05/17/2014   DDD (degenerative disc disease), cervical 05/17/2014   Lumbar disc disease with radiculopathy 05/17/2014   PTSD (post-traumatic stress disorder) 05/17/2014   DIARRHEA 11/17/2009   REFERRING PROVIDER: Kristian Stabs, PA   REFERRING DIAG: Osteoarthritis of right knee   THERAPY DIAG:  Acute pain of right knee  Localized edema  Stiffness of right knee, not elsewhere classified  Rationale for Evaluation and Treatment: Rehabilitation  ONSET DATE: 07/23/23  SUBJECTIVE:   SUBJECTIVE STATEMENT: Patient reports 7-8/10 right knee pain still.  Pt reports doing better today with IBS.   RT hip/LB pain today also. MD f/u 09-04-23  PERTINENT HISTORY: Allergies, osteoarthritis, anxiety, chronic neck and back pain, PTSD, depression, asthma, history of cancer, fibromyalgia, glaucoma, history of a CVA, and history of seizures PAIN:  Are you having pain?  Yes: NPRS scale: 8/10 Pain location: right hip radiating down to her knee Pain description: constant deep pain Aggravating factors: getting into and out of tub Relieving factors: heat   PRECAUTIONS: Fall  RED FLAGS: None   WEIGHT BEARING RESTRICTIONS: No  FALLS:  Has patient fallen in last 6 months? Yes. Number of falls 3+; she slipped at home and fell on her butt last week  LIVING ENVIRONMENT: Lives with: lives alone; has an aide that comes for 2.5 hours 3 times per week Lives in: House/apartment Stairs: No Has following equipment at home: Wheelchair (manual)  OCCUPATION: disabled  PLOF: Independent  PATIENT GOALS: be able to walk, and reduced pain  NEXT MD VISIT: none scheduled  OBJECTIVE:  Note: Objective measures were completed at  Evaluation unless otherwise noted.  COGNITION: Overall cognitive status: Within functional limits for tasks assessed     SENSATION: Patient reports numbness on her right lateral knee since surgery.   EDEMA:  Circumferential: R tibiofemoral joint line: 44.7 cm Left tibiofemoral joint line: 41.5 cm  PALPATION: TTP: right IT band and medial/lateral joint lines  LOWER EXTREMITY ROM:  Active ROM Right eval Left eval  Hip flexion    Hip extension    Hip abduction    Hip adduction    Hip internal rotation    Hip external rotation    Knee flexion 86/106 (PROM) 133  Knee extension 26 1  Ankle dorsiflexion    Ankle plantarflexion    Ankle inversion    Ankle eversion     (Blank rows = not tested)  LOWER EXTREMITY MMT: not tested due to surgical condition  GAIT: Assistive device utilized: Wheelchair (manual) Level of assistance: Total A                                                                                                                              TREATMENT DATE:                    08/21/23                   EXERCISE LOG    RT knee TKR  Exercise Repetitions and Resistance Comments  Nustep L1 x   10  mins seat 6    Stopped due to RT hip/LB pain  Rocker board X 3 mins   DF/PF   LAQs 15 reps   Seated Marches        SAQs 3x10   Knee glide heel slide X20    manual assist for flexion    Blank cell = exercise not performed today   Manual PROM for flexion AND Extension progression IFCx 15 mins 80-150hz  to RT knee Vaso on  low pressure x 15 mins for edema control  PATIENT EDUCATION:  Education details: POC, healing, objective findings, prognosis, and goals for physical therapy Person educated: Patient Education method: Explanation Education comprehension: verbalized understanding  HOME EXERCISE PROGRAM:  ASSESSMENT:  CLINICAL IMPRESSION: Pt arrives for today's treatment session reporting 7-8/10 right knee pain and RT hip/ LBP. She was able to tolerate therex  for quad activation as well as  ROM progression and did fair, but pain limited her progression. IFC and Vaso were used for pain and edema control RTR knee.  OBJECTIVE IMPAIRMENTS: Abnormal gait, decreased activity tolerance, decreased balance, decreased mobility, difficulty walking, decreased ROM, decreased strength, hypomobility, increased edema, impaired sensation, impaired tone, and pain.   ACTIVITY LIMITATIONS: carrying, lifting, standing, squatting, sleeping, stairs, transfers, bed mobility, dressing, and locomotion level  PARTICIPATION LIMITATIONS: meal prep, cleaning, laundry, driving, shopping, and community activity  PERSONAL FACTORS: Past/current experiences, Time since onset of injury/illness/exacerbation, Transportation, and 3+ comorbidities: Allergies, osteoarthritis, anxiety, chronic neck and back pain, PTSD, depression, asthma, history of cancer, fibromyalgia, glaucoma, history of a CVA, and history of seizures  are also affecting patient's functional outcome.   REHAB POTENTIAL: Fair    CLINICAL DECISION MAKING: Evolving/moderate complexity  EVALUATION COMPLEXITY: Moderate   GOALS: Goals reviewed with patient? Yes  LONG TERM GOALS: Target date: 09/11/23  Patient will be independent with her HEP.  Baseline:  Goal status: INITIAL  2.  Patient will be able to demonstrate at least 120 degrees of right knee flexion for improved function navigating steps.  Baseline:  Goal status: INITIAL  3.  Patient will improve her right knee extension to within 5 degrees of neutral for improved gait mechanics.  Baseline:  Goal status: INITIAL  4.  Patient will be able to ambulate at least 80 feet with a walker or the least restrictive assistive device for improved household mobility.  Baseline:  Goal status: INITIAL  5.  Patient will be able to perform a straight leg raise on the right lower extremity without an extension lag greater than 5 degrees for improved quadriceps strength.   Baseline:  Goal status: INITIAL  PLAN:  PT FREQUENCY: 2-3x/week  PT DURATION: 4 weeks  PLANNED INTERVENTIONS: 97164- PT Re-evaluation, 97110-Therapeutic exercises, 97530- Therapeutic activity, 97112- Neuromuscular re-education, 97535- Self Care, [id number]- Manual therapy, 97116- Gait training, 97014- Electrical stimulation (unattended), 97016- Vasopneumatic device, Patient/Family education, Balance training, Stair training, Joint mobilization, Cryotherapy, and Moist heat  PLAN FOR NEXT SESSION: Nustep, heel slides, quad sets, manual therapy, provide HEP, and modalities as needed   Duante Arocho,CHRIS, PTA 08/21/2023, 10:25 AM

## 2023-08-27 ENCOUNTER — Encounter: Payer: Self-pay | Admitting: *Deleted

## 2023-08-27 ENCOUNTER — Ambulatory Visit: Payer: 59 | Admitting: *Deleted

## 2023-08-27 DIAGNOSIS — R600 Localized edema: Secondary | ICD-10-CM

## 2023-08-27 DIAGNOSIS — M25561 Pain in right knee: Secondary | ICD-10-CM | POA: Diagnosis not present

## 2023-08-27 DIAGNOSIS — M25661 Stiffness of right knee, not elsewhere classified: Secondary | ICD-10-CM

## 2023-08-27 NOTE — Therapy (Signed)
 OUTPATIENT PHYSICAL THERAPY LOWER EXTREMITY TREATMENT   Patient Name: Carla Tran MRN: [id number] DOB:1951/04/21, 73 y.o., female Today's Date: 08/27/2023  END OF SESSION:  PT End of Session - 08/27/23 0937     Visit Number 4    Number of Visits 12    Date for PT Re-Evaluation 10/12/23    PT Start Time 0930    PT Stop Time 1025    PT Time Calculation (min) 55 min             Past Medical History:  Diagnosis Date   Allergy    Anginal pain (HCC)    Anxiety    Asthma    Bursitis    shoulder   Cataract    forming    Cervical cancer (HCC)    Depression    DJD (degenerative joint disease)    Dyspnea    Dysrhythmia    Endometriosis    Fibromyalgia    Glaucoma    Head ache    Heart murmur    Hypertension    Irregular heart beat    PTSD (post-traumatic stress disorder)    PTSD (post-traumatic stress disorder)    Seizures (HCC)    x 1 only- in early 2000's and none since    Skin cancer    Stroke [facility])    Past Surgical History:  Procedure Laterality Date   BACK SURGERY     CERVICAL FUSION  2000   C4-5   CHOLECYSTECTOMY     COLONOSCOPY     ligaments removed from elbow     PARTIAL HYSTERECTOMY     SKIN CANCER EXCISION     skin cancer removed     from face x 2, abdomen and arm left    TENDON REPAIR Right    TOTAL KNEE ARTHROPLASTY Right 07/23/2023   Procedure: TOTAL KNEE ARTHROPLASTY;  Surgeon: Melodi Lerner, MD;  Location: [facility];  Service: Orthopedics;  Laterality: Right;   TUBAL LIGATION     Patient Active Problem List   Diagnosis Date Noted   Osteoarthritis of right knee 07/23/2023   Major depressive disorder with single episode, in partial remission (HCC) 07/22/2020   Altered mental status 07/23/2019   Cervical post-laminectomy syndrome 07/23/2019   Cervical spondylosis 07/23/2019   Lumbar facet joint syndrome 07/23/2019   Lumbosacral spondylosis without myelopathy 07/23/2019   Myofascial pain 07/23/2019   Neck pain 07/23/2019   Osteoarthritis  of hip 07/23/2019   Unspecified inflammatory spondylopathy, cervical region (HCC) 07/23/2019   Pain of right sacroiliac joint 10/07/2018   Anxiety 05/01/2017   Sleep walking 12/23/2014   Chronic neck and back pain 05/17/2014   Chronic recurrent major depressive disorder (HCC) 05/17/2014   DDD (degenerative disc disease), cervical 05/17/2014   Lumbar disc disease with radiculopathy 05/17/2014   PTSD (post-traumatic stress disorder) 05/17/2014   DIARRHEA 11/17/2009   REFERRING PROVIDER: Kristian Stabs, PA   REFERRING DIAG: Osteoarthritis of right knee   THERAPY DIAG:  Acute pain of right knee  Localized edema  Stiffness of right knee, not elsewhere classified  Rationale for Evaluation and Treatment: Rehabilitation  ONSET DATE: 07/23/23  SUBJECTIVE:   SUBJECTIVE STATEMENT: Patient reports 7/10 right knee pain.  MD f/u 09-04-23  PERTINENT HISTORY: Allergies, osteoarthritis, anxiety, chronic neck and back pain, PTSD, depression, asthma, history of cancer, fibromyalgia, glaucoma, history of a CVA, and history of seizures PAIN:  Are you having pain? Yes: NPRS scale: 6/10 Pain location: right hip radiating down to her knee Pain description:  constant deep pain Aggravating factors: getting into and out of tub Relieving factors: heat   PRECAUTIONS: Fall  RED FLAGS: None   WEIGHT BEARING RESTRICTIONS: No  FALLS:  Has patient fallen in last 6 months? Yes. Number of falls 3+; she slipped at home and fell on her butt last week  LIVING ENVIRONMENT: Lives with: lives alone; has an aide that comes for 2.5 hours 3 times per week Lives in: House/apartment Stairs: No Has following equipment at home: Wheelchair (manual)  OCCUPATION: disabled  PLOF: Independent  PATIENT GOALS: be able to walk, and reduced pain  NEXT MD VISIT: none scheduled  OBJECTIVE:  Note: Objective measures were completed at Evaluation unless otherwise noted.  COGNITION: Overall cognitive status:  Within functional limits for tasks assessed     SENSATION: Patient reports numbness on her right lateral knee since surgery.   EDEMA:  Circumferential: R tibiofemoral joint line: 44.7 cm Left tibiofemoral joint line: 41.5 cm  PALPATION: TTP: right IT band and medial/lateral joint lines  LOWER EXTREMITY ROM:  Active ROM Right eval Left eval  Hip flexion    Hip extension    Hip abduction    Hip adduction    Hip internal rotation    Hip external rotation    Knee flexion 86/106 (PROM) 133  Knee extension 26 1  Ankle dorsiflexion    Ankle plantarflexion    Ankle inversion    Ankle eversion     (Blank rows = not tested)  LOWER EXTREMITY MMT: not tested due to surgical condition  GAIT: Assistive device utilized: Wheelchair (manual) Level of assistance: Total A                                                                                                                              TREATMENT DATE:                    08/27/23                   EXERCISE LOG    RT knee TKR  Exercise Repetitions and Resistance Comments  Nustep L1 x   15  mins seat 6      Rocker board X 5 mins   DF/PF   LAQs 2#  2x10 pause  at top   Seated Marches        SAQs x10   Knee glide heel slide X20    manual assist for flexion    Blank cell = exercise not performed today   Manual PROM for flexion AND Extension progression IFCx 15 mins 80-150hz  to RT knee Vaso on  low pressure x 15 mins for edema control  PATIENT EDUCATION:  Education details: POC, healing, objective findings, prognosis, and goals for physical therapy Person educated: Patient Education method: Explanation Education comprehension: verbalized understanding  HOME EXERCISE PROGRAM:   ASSESSMENT:  CLINICAL IMPRESSION: Pt arrives for today's treatment session reporting 7/10 right knee  pain. She was able to tolerate therex for quad activation as well as  ROM progression and did fair. Tolerated 2# wt on LAQ's today.  IFC and Vaso  were used for pain and edema control. Discussed importance of stretching at home.  OBJECTIVE IMPAIRMENTS: Abnormal gait, decreased activity tolerance, decreased balance, decreased mobility, difficulty walking, decreased ROM, decreased strength, hypomobility, increased edema, impaired sensation, impaired tone, and pain.   ACTIVITY LIMITATIONS: carrying, lifting, standing, squatting, sleeping, stairs, transfers, bed mobility, dressing, and locomotion level  PARTICIPATION LIMITATIONS: meal prep, cleaning, laundry, driving, shopping, and community activity  PERSONAL FACTORS: Past/current experiences, Time since onset of injury/illness/exacerbation, Transportation, and 3+ comorbidities: Allergies, osteoarthritis, anxiety, chronic neck and back pain, PTSD, depression, asthma, history of cancer, fibromyalgia, glaucoma, history of a CVA, and history of seizures  are also affecting patient's functional outcome.   REHAB POTENTIAL: Fair    CLINICAL DECISION MAKING: Evolving/moderate complexity  EVALUATION COMPLEXITY: Moderate   GOALS: Goals reviewed with patient? Yes  LONG TERM GOALS: Target date: 09/11/23  Patient will be independent with her HEP.  Baseline:  Goal status: INITIAL  2.  Patient will be able to demonstrate at least 120 degrees of right knee flexion for improved function navigating steps.  Baseline:  Goal status: INITIAL  3.  Patient will improve her right knee extension to within 5 degrees of neutral for improved gait mechanics.  Baseline:  Goal status: INITIAL  4.  Patient will be able to ambulate at least 80 feet with a walker or the least restrictive assistive device for improved household mobility.  Baseline:  Goal status: INITIAL  5.  Patient will be able to perform a straight leg raise on the right lower extremity without an extension lag greater than 5 degrees for improved quadriceps strength.  Baseline:  Goal status: INITIAL  PLAN:  PT FREQUENCY: 2-3x/week  PT  DURATION: 4 weeks  PLANNED INTERVENTIONS: 97164- PT Re-evaluation, 97110-Therapeutic exercises, 97530- Therapeutic activity, 97112- Neuromuscular re-education, 97535- Self Care, [id number]- Manual therapy, [id number]- Gait training, 97014- Electrical stimulation (unattended), 97016- Vasopneumatic device, Patient/Family education, Balance training, Stair training, Joint mobilization, Cryotherapy, and Moist heat  PLAN FOR NEXT SESSION: Nustep, heel slides, quad sets, manual therapy, provide HEP, and modalities as needed   Jacqulene Huntley,CHRIS, PTA 08/27/2023, 12:00 PM

## 2023-08-31 ENCOUNTER — Encounter: Payer: Self-pay | Admitting: *Deleted

## 2023-08-31 ENCOUNTER — Ambulatory Visit: Payer: 59 | Admitting: *Deleted

## 2023-08-31 DIAGNOSIS — M25561 Pain in right knee: Secondary | ICD-10-CM

## 2023-08-31 DIAGNOSIS — M25661 Stiffness of right knee, not elsewhere classified: Secondary | ICD-10-CM

## 2023-08-31 DIAGNOSIS — R600 Localized edema: Secondary | ICD-10-CM

## 2023-08-31 NOTE — Therapy (Addendum)
OUTPATIENT PHYSICAL THERAPY LOWER EXTREMITY TREATMENT   Patient Name: Carla Tran MRN: [id number] DOB:[birth year], 73 y.o., female Today's Date: 08/31/2023  END OF SESSION:  PT End of Session - 08/31/23 0950     Visit Number 5    Number of Visits 12    Date for PT Re-Evaluation 10/12/23    PT Start Time 0925    PT Stop Time 1025    PT Time Calculation (min) 60 min             Past Medical History:  Diagnosis Date   Allergy    Anginal pain (HCC)    Anxiety    Asthma    Bursitis    shoulder   Cataract    forming    Cervical cancer (HCC)    Depression    DJD (degenerative joint disease)    Dyspnea    Dysrhythmia    Endometriosis    Fibromyalgia    Glaucoma    Head ache    Heart murmur    Hypertension    Irregular heart beat    PTSD (post-traumatic stress disorder)    PTSD (post-traumatic stress disorder)    Seizures (HCC)    x 1 only- in early 2000's and none since    Skin cancer    Stroke [facility])    Past Surgical History:  Procedure Laterality Date   BACK SURGERY     CERVICAL FUSION  2000   C4-5   CHOLECYSTECTOMY     COLONOSCOPY     ligaments removed from elbow     PARTIAL HYSTERECTOMY     SKIN CANCER EXCISION     skin cancer removed     from face x 2, abdomen and arm left    TENDON REPAIR Right    TOTAL KNEE ARTHROPLASTY Right 07/23/2023   Procedure: TOTAL KNEE ARTHROPLASTY;  Surgeon: Ollen Gross, MD;  Location: [facility];  Service: Orthopedics;  Laterality: Right;   TUBAL LIGATION     Patient Active Problem List   Diagnosis Date Noted   Osteoarthritis of right knee 07/23/2023   Major depressive disorder with single episode, in partial remission (HCC) 07/22/2020   Altered mental status 07/23/2019   Cervical post-laminectomy syndrome 07/23/2019   Cervical spondylosis 07/23/2019   Lumbar facet joint syndrome 07/23/2019   Lumbosacral spondylosis without myelopathy 07/23/2019   Myofascial pain 07/23/2019   Neck pain 07/23/2019   Osteoarthritis  of hip 07/23/2019   Unspecified inflammatory spondylopathy, cervical region (HCC) 07/23/2019   Pain of right sacroiliac joint 10/07/2018   Anxiety 05/01/2017   Sleep walking 12/23/2014   Chronic neck and back pain 05/17/2014   Chronic recurrent major depressive disorder (HCC) 05/17/2014   DDD (degenerative disc disease), cervical 05/17/2014   Lumbar disc disease with radiculopathy 05/17/2014   PTSD (post-traumatic stress disorder) 05/17/2014   DIARRHEA 11/17/2009   REFERRING PROVIDER: Eartha Inch, PA   REFERRING DIAG: Osteoarthritis of right knee   THERAPY DIAG:  Acute pain of right knee  Localized edema  Stiffness of right knee, not elsewhere classified  Rationale for Evaluation and Treatment: Rehabilitation  ONSET DATE: 07/23/23  SUBJECTIVE:   SUBJECTIVE STATEMENT: Patient reports 01/21/09 right knee pain. And back is flared up as well  MD f/u 09-04-23  PERTINENT HISTORY: Allergies, osteoarthritis, anxiety, chronic neck and back pain, PTSD, depression, asthma, history of cancer, fibromyalgia, glaucoma, history of a CVA, and history of seizures PAIN:  Are you having pain? Yes: NPRS scale: 6/10 Pain location: right hip  radiating down to her knee Pain description: constant deep pain Aggravating factors: getting into and out of tub Relieving factors: heat   PRECAUTIONS: Fall  RED FLAGS: None   WEIGHT BEARING RESTRICTIONS: No  FALLS:  Has patient fallen in last 6 months? Yes. Number of falls 3+; she slipped at home and fell on her butt last week  LIVING ENVIRONMENT: Lives with: lives alone; has an aide that comes for 2.5 hours 3 times per week Lives in: House/apartment Stairs: No Has following equipment at home: Wheelchair (manual)  OCCUPATION: disabled  PLOF: Independent  PATIENT GOALS: be able to walk, and reduced pain  NEXT MD VISIT: none scheduled  OBJECTIVE:  Note: Objective measures were completed at Evaluation unless otherwise  noted.  COGNITION: Overall cognitive status: Within functional limits for tasks assessed     SENSATION: Patient reports numbness on her right lateral knee since surgery.   EDEMA:  Circumferential: R tibiofemoral joint line: 44.7 cm Left tibiofemoral joint line: 41.5 cm  PALPATION: TTP: right IT band and medial/lateral joint lines  LOWER EXTREMITY ROM:  Active ROM Right eval Left eval  Hip flexion    Hip extension    Hip abduction    Hip adduction    Hip internal rotation    Hip external rotation    Knee flexion 86/106 (PROM) 133  Knee extension 26 1  Ankle dorsiflexion    Ankle plantarflexion    Ankle inversion    Ankle eversion     (Blank rows = not tested)  LOWER EXTREMITY MMT: not tested due to surgical condition  GAIT: Assistive device utilized: Wheelchair (manual) Level of assistance: Total A                                                                                                                              TREATMENT DATE:                    08/31/23                   EXERCISE LOG    RT knee TKR  Exercise Repetitions and Resistance Comments  Nustep L3 x   15  mins seat 7,6      Rocker board X 5 mins   DF/PF   LAQs 2#  3x10 pause  at top   HS curl Red x15   Seated Marches    14in lunge Flexion stretch 3x10   SAQs    Knee glide heel slide     Blank cell = exercise not performed today   Manual PROM for flexion AND Extension progression IFCx 15 mins 80-150hz  to RT knee Vaso on  low pressure x 15 mins for edema control  PATIENT EDUCATION:  Education details: POC, healing, objective findings, prognosis, and goals for physical therapy Person educated: Patient Education method: Explanation Education comprehension: verbalized understanding  HOME EXERCISE PROGRAM:   ASSESSMENT:  CLINICAL IMPRESSION: Pt  arrived  today doing okay with RT knee and will f/u with MD on Monday. She was able to tolerate therex for quad activation as well as  ROM  progression. PROM today RT knee was  8-115 degrees.  IFC and Vaso were used for pain and edema control. Discussed importance of stretching at home.  MD note Pt not doing well today due to back pain.  08/31/23 PROGRESS REPORT:  Patient is making good progress with skilled physical therapy as evidenced by her objective measures, functional mobility, and progress toward her goals. She was able to nearly meet her right knee active range of motion goals. However, she has yet to meet her functional goals for physical therapy. Recommend that she continue with skilled physical therapy to address her remaining impairments to return to her prior level of function.   Candi Leash, PT, DPT   OBJECTIVE IMPAIRMENTS: Abnormal gait, decreased activity tolerance, decreased balance, decreased mobility, difficulty walking, decreased ROM, decreased strength, hypomobility, increased edema, impaired sensation, impaired tone, and pain.   ACTIVITY LIMITATIONS: carrying, lifting, standing, squatting, sleeping, stairs, transfers, bed mobility, dressing, and locomotion level  PARTICIPATION LIMITATIONS: meal prep, cleaning, laundry, driving, shopping, and community activity  PERSONAL FACTORS: Past/current experiences, Time since onset of injury/illness/exacerbation, Transportation, and 3+ comorbidities: Allergies, osteoarthritis, anxiety, chronic neck and back pain, PTSD, depression, asthma, history of cancer, fibromyalgia, glaucoma, history of a CVA, and history of seizures  are also affecting patient's functional outcome.   REHAB POTENTIAL: Fair    CLINICAL DECISION MAKING: Evolving/moderate complexity  EVALUATION COMPLEXITY: Moderate   GOALS: Goals reviewed with patient? Yes  LONG TERM GOALS: Target date: 09/11/23  Patient will be independent with her HEP.  Baseline:  Goal status: MET  2.  Patient will be able to demonstrate at least 120 degrees of right knee flexion for improved function navigating steps.   Baseline:  Goal status: On going  3.  Patient will improve her right knee extension to within 5 degrees of neutral for improved gait mechanics.  Baseline:  Goal status: On going  4.  Patient will be able to ambulate at least 80 feet with a walker or the least restrictive assistive device for improved household mobility.  Baseline:  Goal status   On going  5.  Patient will be able to perform a straight leg raise on the right lower extremity without an extension lag greater than 5 degrees for improved quadriceps strength.  Baseline:  Goal status: ON going  PLAN:  PT FREQUENCY: 2-3x/week  PT DURATION: 4 weeks  PLANNED INTERVENTIONS: 97164- PT Re-evaluation, 97110-Therapeutic exercises, 97530- Therapeutic activity, 97112- Neuromuscular re-education, 97535- Self Care, [id number]- Manual therapy, 97116- Gait training, 97014- Electrical stimulation (unattended), 97016- Vasopneumatic device, Patient/Family education, Balance training, Stair training, Joint mobilization, Cryotherapy, and Moist heat  PLAN FOR NEXT SESSION: Nustep, heel slides, quad sets, manual therapy, provide HEP, and modalities as needed   Shenice Dolder,CHRIS, PTA 08/31/2023, 11:55 AM

## 2023-09-04 ENCOUNTER — Encounter: Payer: 59 | Admitting: *Deleted

## 2023-09-17 ENCOUNTER — Ambulatory Visit (INDEPENDENT_AMBULATORY_CARE_PROVIDER_SITE_OTHER): Payer: Self-pay | Admitting: Licensed Clinical Social Worker

## 2023-09-17 DIAGNOSIS — Z91199 Patient's noncompliance with other medical treatment and regimen due to unspecified reason: Secondary | ICD-10-CM

## 2023-09-17 NOTE — Progress Notes (Signed)
Patient is a late cancel for appointment still recovering from knee surgery

## 2023-09-20 ENCOUNTER — Ambulatory Visit: Payer: 59 | Attending: Student

## 2023-09-20 DIAGNOSIS — R600 Localized edema: Secondary | ICD-10-CM | POA: Diagnosis present

## 2023-09-20 DIAGNOSIS — M25661 Stiffness of right knee, not elsewhere classified: Secondary | ICD-10-CM | POA: Insufficient documentation

## 2023-09-20 DIAGNOSIS — M25561 Pain in right knee: Secondary | ICD-10-CM | POA: Insufficient documentation

## 2023-09-20 NOTE — Therapy (Signed)
 OUTPATIENT PHYSICAL THERAPY LOWER EXTREMITY TREATMENT   Patient Name: Afomia Blackley MRN: [id number] DOB:1950/09/27, 73 y.o., female Today's Date: 09/20/2023  END OF SESSION:  PT End of Session - 09/20/23 0936     Visit Number 6    Number of Visits 12    Date for PT Re-Evaluation 10/12/23    PT Start Time 0930    PT Stop Time 1014    PT Time Calculation (min) 44 min    Activity Tolerance Patient limited by pain    Behavior During Therapy [facility] for tasks assessed/performed              Past Medical History:  Diagnosis Date   Allergy    Anginal pain (HCC)    Anxiety    Asthma    Bursitis    shoulder   Cataract    forming    Cervical cancer (HCC)    Depression    DJD (degenerative joint disease)    Dyspnea    Dysrhythmia    Endometriosis    Fibromyalgia    Glaucoma    Head ache    Heart murmur    Hypertension    Irregular heart beat    PTSD (post-traumatic stress disorder)    PTSD (post-traumatic stress disorder)    Seizures (HCC)    x 1 only- in early 2000's and none since    Skin cancer    Stroke [facility])    Past Surgical History:  Procedure Laterality Date   BACK SURGERY     CERVICAL FUSION  2000   C4-5   CHOLECYSTECTOMY     COLONOSCOPY     ligaments removed from elbow     PARTIAL HYSTERECTOMY     SKIN CANCER EXCISION     skin cancer removed     from face x 2, abdomen and arm left    TENDON REPAIR Right    TOTAL KNEE ARTHROPLASTY Right 07/23/2023   Procedure: TOTAL KNEE ARTHROPLASTY;  Surgeon: Melodi Lerner, MD;  Location: [facility];  Service: Orthopedics;  Laterality: Right;   TUBAL LIGATION     Patient Active Problem List   Diagnosis Date Noted   Osteoarthritis of right knee 07/23/2023   Major depressive disorder with single episode, in partial remission (HCC) 07/22/2020   Altered mental status 07/23/2019   Cervical post-laminectomy syndrome 07/23/2019   Cervical spondylosis 07/23/2019   Lumbar facet joint syndrome 07/23/2019   Lumbosacral  spondylosis without myelopathy 07/23/2019   Myofascial pain 07/23/2019   Neck pain 07/23/2019   Osteoarthritis of hip 07/23/2019   Unspecified inflammatory spondylopathy, cervical region (HCC) 07/23/2019   Pain of right sacroiliac joint 10/07/2018   Anxiety 05/01/2017   Sleep walking 12/23/2014   Chronic neck and back pain 05/17/2014   Chronic recurrent major depressive disorder (HCC) 05/17/2014   DDD (degenerative disc disease), cervical 05/17/2014   Lumbar disc disease with radiculopathy 05/17/2014   PTSD (post-traumatic stress disorder) 05/17/2014   DIARRHEA 11/17/2009   REFERRING PROVIDER: Kristian Stabs, PA   REFERRING DIAG: Osteoarthritis of right knee   THERAPY DIAG:  Acute pain of right knee  Localized edema  Stiffness of right knee, not elsewhere classified  Rationale for Evaluation and Treatment: Rehabilitation  ONSET DATE: 07/23/23  SUBJECTIVE:   SUBJECTIVE STATEMENT: Patient reports that she is hurting and her leg feels weak today. Patient reports that she feel since her last appointment which caused her to miss about weeks of therapy.   PERTINENT HISTORY: Allergies, osteoarthritis, anxiety, chronic  neck and back pain, PTSD, depression, asthma, history of cancer, fibromyalgia, glaucoma, history of a CVA, and history of seizures PAIN:  Are you having pain? Yes: NPRS scale: no pain score provided Pain location: right hip radiating down to her knee Pain description: constant deep pain Aggravating factors: getting into and out of tub Relieving factors: heat   PRECAUTIONS: Fall  RED FLAGS: None   WEIGHT BEARING RESTRICTIONS: No  FALLS:  Has patient fallen in last 6 months? Yes. Number of falls 3+; she slipped at home and fell on her butt last week  LIVING ENVIRONMENT: Lives with: lives alone; has an aide that comes for 2.5 hours 3 times per week Lives in: House/apartment Stairs: No Has following equipment at home: Wheelchair (manual)  OCCUPATION:  disabled  PLOF: Independent  PATIENT GOALS: be able to walk, and reduced pain  NEXT MD VISIT: 09/24/23  OBJECTIVE:  Note: Objective measures were completed at Evaluation unless otherwise noted.  COGNITION: Overall cognitive status: Within functional limits for tasks assessed     SENSATION: Patient reports numbness on her right lateral knee since surgery.   EDEMA:  Circumferential: R tibiofemoral joint line: 44.7 cm Left tibiofemoral joint line: 41.5 cm Circumferential on 09/20/23: R tibiofemoral joint line: 46.2 cm L tibiofemoral joint line: 42.1 cm   PALPATION: TTP: right IT band and medial/lateral joint lines  LOWER EXTREMITY ROM:  Active ROM Right eval Right 09/20/23 Left eval  Hip flexion     Hip extension     Hip abduction     Hip adduction     Hip internal rotation     Hip external rotation     Knee flexion 86/106 (PROM) 110 133  Knee extension 26 17 1   Ankle dorsiflexion     Ankle plantarflexion     Ankle inversion     Ankle eversion      (Blank rows = not tested)  LOWER EXTREMITY MMT: not tested due to surgical condition  GAIT: Assistive device utilized: Wheelchair (manual) Level of assistance: Total A                                                                                                                              TREATMENT DATE:                                     09/20/23 EXERCISE LOG  Exercise Repetitions and Resistance Comments  Nustep  L4 x 15 minutes; seat 9   LAQ 3 minutes RLE only   Seated hip ADD isometric  30 reps w/ 5 second hold   Seated HS curl  Red t-band x 30 reps RLE only   ROM measurements      Blank cell = exercise not performed today                  08/31/23  EXERCISE LOG    RT knee TKR  Exercise Repetitions and Resistance Comments  Nustep L3 x   15  mins seat 7,6      Rocker board X 5 mins   DF/PF   LAQs 2#  3x10 pause  at top   HS curl Red x15   Seated Marches    14in lunge Flexion stretch 3x10    SAQs    Knee glide heel slide     Blank cell = exercise not performed today   Manual PROM for flexion AND Extension progression IFCx 15 mins 80-150hz  to RT knee Vaso on  low pressure x 15 mins for edema control  PATIENT EDUCATION:  Education details: POC, healing, objective findings, prognosis, and goals for physical therapy Person educated: Patient Education method: Explanation Education comprehension: verbalized understanding  HOME EXERCISE PROGRAM:   ASSESSMENT:  CLINICAL IMPRESSION: Patient has regressed significantly since her last appointment on 08/31/23. Her knee flexion decreased by 5 degrees while her extension also regressed by 9 degrees. This regression can be partially attributed to her inability to attend physical therapy from 08/31/23-09/20/23. Today's treatment focused on familiar interventions for improved right knee mobility.  She required moderate cueing with long arc quads and seated hamstring curls to facilitate a full arc of motion. Pain also significantly limited her ability to be introduced to other new interventions. She also exhibited increased right lower extremity edema since her initial evaluation on 08/14/23. However, she did not exhibit any other signs or symptoms of a postoperative complication. Recommend that she continue with her current plan of care to address her remaining impairments to maximize her functional mobility.  OBJECTIVE IMPAIRMENTS: Abnormal gait, decreased activity tolerance, decreased balance, decreased mobility, difficulty walking, decreased ROM, decreased strength, hypomobility, increased edema, impaired sensation, impaired tone, and pain.   ACTIVITY LIMITATIONS: carrying, lifting, standing, squatting, sleeping, stairs, transfers, bed mobility, dressing, and locomotion level  PARTICIPATION LIMITATIONS: meal prep, cleaning, laundry, driving, shopping, and community activity  PERSONAL FACTORS: Past/current experiences, Time since onset of  injury/illness/exacerbation, Transportation, and 3+ comorbidities: Allergies, osteoarthritis, anxiety, chronic neck and back pain, PTSD, depression, asthma, history of cancer, fibromyalgia, glaucoma, history of a CVA, and history of seizures  are also affecting patient's functional outcome.   REHAB POTENTIAL: Fair    CLINICAL DECISION MAKING: Evolving/moderate complexity  EVALUATION COMPLEXITY: Moderate   GOALS: Goals reviewed with patient? Yes  LONG TERM GOALS: Target date: 09/11/23  Patient will be independent with her HEP.  Baseline:  Goal status: MET  2.  Patient will be able to demonstrate at least 120 degrees of right knee flexion for improved function navigating steps.  Baseline:  Goal status: On going  3.  Patient will improve her right knee extension to within 5 degrees of neutral for improved gait mechanics.  Baseline:  Goal status: On going  4.  Patient will be able to ambulate at least 80 feet with a walker or the least restrictive assistive device for improved household mobility.  Baseline:  Goal status   MET  5.  Patient will be able to perform a straight leg raise on the right lower extremity without an extension lag greater than 5 degrees for improved quadriceps strength.  Baseline:  Goal status: ON going  PLAN:  PT FREQUENCY: 2-3x/week  PT DURATION: 4 weeks  PLANNED INTERVENTIONS: 97164- PT Re-evaluation, 97110-Therapeutic exercises, 97530- Therapeutic activity, 97112- Neuromuscular re-education, 97535- Self Care, [id number]- Manual therapy, [id number]- Gait training, 97014- Electrical stimulation (unattended), 97016- Vasopneumatic device, Patient/Family  education, Balance training, Stair training, Joint mobilization, Cryotherapy, and Moist heat  PLAN FOR NEXT SESSION: Nustep, heel slides, quad sets, manual therapy, provide HEP, and modalities as needed   Lacinda JAYSON Fass, PT 09/20/2023, 2:03 PM

## 2023-09-28 ENCOUNTER — Ambulatory Visit: Payer: 59

## 2023-09-28 DIAGNOSIS — R600 Localized edema: Secondary | ICD-10-CM

## 2023-09-28 DIAGNOSIS — M25661 Stiffness of right knee, not elsewhere classified: Secondary | ICD-10-CM

## 2023-09-28 DIAGNOSIS — M25561 Pain in right knee: Secondary | ICD-10-CM

## 2023-09-28 NOTE — Therapy (Signed)
OUTPATIENT PHYSICAL THERAPY LOWER EXTREMITY TREATMENT   Patient Name: Carla Tran MRN: [id number] DOB:Sep 22, 1950, 73 y.o., female Today's Date: 09/28/2023  END OF SESSION:  PT End of Session - 09/28/23 0936     Visit Number 7    Number of Visits 12    Date for PT Re-Evaluation 10/12/23    PT Start Time 0931    PT Stop Time 1000    PT Time Calculation (min) 29 min    Activity Tolerance Patient limited by pain    Behavior During Therapy [facility] for tasks assessed/performed               Past Medical History:  Diagnosis Date   Allergy    Anginal pain (HCC)    Anxiety    Asthma    Bursitis    shoulder   Cataract    forming    Cervical cancer (HCC)    Depression    DJD (degenerative joint disease)    Dyspnea    Dysrhythmia    Endometriosis    Fibromyalgia    Glaucoma    Head ache    Heart murmur    Hypertension    Irregular heart beat    PTSD (post-traumatic stress disorder)    PTSD (post-traumatic stress disorder)    Seizures (HCC)    x 1 only- in early 2000's and none since    Skin cancer    Stroke [facility])    Past Surgical History:  Procedure Laterality Date   BACK SURGERY     CERVICAL FUSION  2000   C4-5   CHOLECYSTECTOMY     COLONOSCOPY     ligaments removed from elbow     PARTIAL HYSTERECTOMY     SKIN CANCER EXCISION     skin cancer removed     from face x 2, abdomen and arm left    TENDON REPAIR Right    TOTAL KNEE ARTHROPLASTY Right 07/23/2023   Procedure: TOTAL KNEE ARTHROPLASTY;  Surgeon: Ollen Gross, MD;  Location: [facility];  Service: Orthopedics;  Laterality: Right;   TUBAL LIGATION     Patient Active Problem List   Diagnosis Date Noted   Osteoarthritis of right knee 07/23/2023   Major depressive disorder with single episode, in partial remission (HCC) 07/22/2020   Altered mental status 07/23/2019   Cervical post-laminectomy syndrome 07/23/2019   Cervical spondylosis 07/23/2019   Lumbar facet joint syndrome 07/23/2019   Lumbosacral  spondylosis without myelopathy 07/23/2019   Myofascial pain 07/23/2019   Neck pain 07/23/2019   Osteoarthritis of hip 07/23/2019   Unspecified inflammatory spondylopathy, cervical region (HCC) 07/23/2019   Pain of right sacroiliac joint 10/07/2018   Anxiety 05/01/2017   Sleep walking 12/23/2014   Chronic neck and back pain 05/17/2014   Chronic recurrent major depressive disorder (HCC) 05/17/2014   DDD (degenerative disc disease), cervical 05/17/2014   Lumbar disc disease with radiculopathy 05/17/2014   PTSD (post-traumatic stress disorder) 05/17/2014   DIARRHEA 11/17/2009   REFERRING PROVIDER: Eartha Inch, PA   REFERRING DIAG: Osteoarthritis of right knee   THERAPY DIAG:  Acute pain of right knee  Localized edema  Stiffness of right knee, not elsewhere classified  Rationale for Evaluation and Treatment: Rehabilitation  ONSET DATE: 07/23/23  SUBJECTIVE:   SUBJECTIVE STATEMENT: Patient reports that the back of her leg is hurting this morning. She has to leave her treatment early today.   PERTINENT HISTORY: Allergies, osteoarthritis, anxiety, chronic neck and back pain, PTSD, depression, asthma, history of  cancer, fibromyalgia, glaucoma, history of a CVA, and history of seizures PAIN:  Are you having pain? Yes: NPRS scale: no pain score provided Pain location: right hip radiating down to her knee Pain description: constant deep pain Aggravating factors: getting into and out of tub Relieving factors: heat   PRECAUTIONS: Fall  RED FLAGS: None   WEIGHT BEARING RESTRICTIONS: No  FALLS:  Has patient fallen in last 6 months? Yes. Number of falls 3+; she slipped at home and fell on her butt last week  LIVING ENVIRONMENT: Lives with: lives alone; has an aide that comes for 2.5 hours 3 times per week Lives in: House/apartment Stairs: No Has following equipment at home: Wheelchair (manual)  OCCUPATION: disabled  PLOF: Independent  PATIENT GOALS: be able to  walk, and reduced pain  NEXT MD VISIT: 09/24/23  OBJECTIVE:  Note: Objective measures were completed at Evaluation unless otherwise noted.  COGNITION: Overall cognitive status: Within functional limits for tasks assessed     SENSATION: Patient reports numbness on her right lateral knee since surgery.   EDEMA:  Circumferential: R tibiofemoral joint line: 44.7 cm Left tibiofemoral joint line: 41.5 cm Circumferential on 09/20/23: R tibiofemoral joint line: 46.2 cm L tibiofemoral joint line: 42.1 cm   PALPATION: TTP: right IT band and medial/lateral joint lines  LOWER EXTREMITY ROM:  Active ROM Right eval Right 09/20/23 Left eval  Hip flexion     Hip extension     Hip abduction     Hip adduction     Hip internal rotation     Hip external rotation     Knee flexion 86/106 (PROM) 110 133  Knee extension 26 17 1   Ankle dorsiflexion     Ankle plantarflexion     Ankle inversion     Ankle eversion      (Blank rows = not tested)  LOWER EXTREMITY MMT: not tested due to surgical condition  GAIT: Assistive device utilized: Wheelchair (manual) Level of assistance: Total A                                                                                                                              TREATMENT DATE:                                     09/28/23 EXERCISE LOG  Exercise Repetitions and Resistance Comments  Nustep  L4 x 15 minutes    Rocker board  2 minutes   Lunges onto step  8" step x 20 reps  For R knee flexion  LAQ 20 reps  RLE only   Standing HS curl  15 reps  RLE only    Blank cell = exercise not performed today  09/20/23 EXERCISE LOG  Exercise Repetitions and Resistance Comments  Nustep  L4 x 15 minutes; seat 9   LAQ 3 minutes RLE only   Seated hip ADD isometric  30 reps w/ 5 second hold   Seated HS curl  Red t-band x 30 reps RLE only   ROM measurements      Blank cell = exercise not performed today                  08/31/23                    EXERCISE LOG    RT knee TKR  Exercise Repetitions and Resistance Comments  Nustep L3 x   15  mins seat 7,6      Rocker board X 5 mins   DF/PF   LAQs 2#  3x10 pause  at top   HS curl Red x15   Seated Marches    14in lunge Flexion stretch 3x10   SAQs    Knee glide heel slide     Blank cell = exercise not performed today   Manual PROM for flexion AND Extension progression IFCx 15 mins 80-150hz  to RT knee Vaso on  low pressure x 15 mins for edema control  PATIENT EDUCATION:  Education details: healing and anatomy Person educated: Patient Education method: Explanation Education comprehension: verbalized understanding  HOME EXERCISE PROGRAM:   ASSESSMENT:  CLINICAL IMPRESSION: Today's treatment focused on familiar interventions for improved right knee mobility. She required moderate cueing with today's interventions for proper exercise performance to facilitate knee mobility without aggravating her familiar symptoms. She reported that her knee felt alright upon the conclusion of treatment. She continues to require skilled physical therapy to address her remaining impairments to maximize her functional mobility.   OBJECTIVE IMPAIRMENTS: Abnormal gait, decreased activity tolerance, decreased balance, decreased mobility, difficulty walking, decreased ROM, decreased strength, hypomobility, increased edema, impaired sensation, impaired tone, and pain.   ACTIVITY LIMITATIONS: carrying, lifting, standing, squatting, sleeping, stairs, transfers, bed mobility, dressing, and locomotion level  PARTICIPATION LIMITATIONS: meal prep, cleaning, laundry, driving, shopping, and community activity  PERSONAL FACTORS: Past/current experiences, Time since onset of injury/illness/exacerbation, Transportation, and 3+ comorbidities: Allergies, osteoarthritis, anxiety, chronic neck and back pain, PTSD, depression, asthma, history of cancer, fibromyalgia, glaucoma, history of a CVA, and history of  seizures  are also affecting patient's functional outcome.   REHAB POTENTIAL: Fair    CLINICAL DECISION MAKING: Evolving/moderate complexity  EVALUATION COMPLEXITY: Moderate   GOALS: Goals reviewed with patient? Yes  LONG TERM GOALS: Target date: 09/11/23  Patient will be independent with her HEP.  Baseline:  Goal status: MET  2.  Patient will be able to demonstrate at least 120 degrees of right knee flexion for improved function navigating steps.  Baseline:  Goal status: On going  3.  Patient will improve her right knee extension to within 5 degrees of neutral for improved gait mechanics.  Baseline:  Goal status: On going  4.  Patient will be able to ambulate at least 80 feet with a walker or the least restrictive assistive device for improved household mobility.  Baseline:  Goal status   MET  5.  Patient will be able to perform a straight leg raise on the right lower extremity without an extension lag greater than 5 degrees for improved quadriceps strength.  Baseline:  Goal status: ON going  PLAN:  PT FREQUENCY: 2-3x/week  PT DURATION: 4 weeks  PLANNED INTERVENTIONS: 16109- PT Re-evaluation,  97110-Therapeutic exercises, 97530- Therapeutic activity, [id number]- Neuromuscular re-education, 97535- Self Care, [id number]- Manual therapy, [phone]- Gait training, 97014- Electrical stimulation (unattended), 97016- Vasopneumatic device, Patient/Family education, Balance training, Stair training, Joint mobilization, Cryotherapy, and Moist heat  PLAN FOR NEXT SESSION: Nustep, heel slides, quad sets, manual therapy, provide HEP, and modalities as needed   Granville Lewis, PT 09/28/2023, 10:09 AM

## 2023-10-03 ENCOUNTER — Ambulatory Visit (HOSPITAL_COMMUNITY): Payer: 59 | Admitting: Psychiatry

## 2023-10-05 ENCOUNTER — Ambulatory Visit: Payer: 59

## 2023-10-05 DIAGNOSIS — R600 Localized edema: Secondary | ICD-10-CM

## 2023-10-05 DIAGNOSIS — M25561 Pain in right knee: Secondary | ICD-10-CM | POA: Diagnosis not present

## 2023-10-05 DIAGNOSIS — M25661 Stiffness of right knee, not elsewhere classified: Secondary | ICD-10-CM

## 2023-10-05 NOTE — Therapy (Signed)
OUTPATIENT PHYSICAL THERAPY LOWER EXTREMITY TREATMENT   Patient Name: Carla Tran MRN: [id number] DOB:01-05-1951, 73 y.o., female Today's Date: 10/05/2023  END OF SESSION:  PT End of Session - 10/05/23 0935     Visit Number 8    Number of Visits 12    Date for PT Re-Evaluation 10/12/23    PT Start Time 0930    PT Stop Time 1000    PT Time Calculation (min) 30 min    Activity Tolerance Patient limited by pain    Behavior During Therapy [facility] for tasks assessed/performed                Past Medical History:  Diagnosis Date   Allergy    Anginal pain (HCC)    Anxiety    Asthma    Bursitis    shoulder   Cataract    forming    Cervical cancer (HCC)    Depression    DJD (degenerative joint disease)    Dyspnea    Dysrhythmia    Endometriosis    Fibromyalgia    Glaucoma    Head ache    Heart murmur    Hypertension    Irregular heart beat    PTSD (post-traumatic stress disorder)    PTSD (post-traumatic stress disorder)    Seizures (HCC)    x 1 only- in early 2000's and none since    Skin cancer    Stroke [facility])    Past Surgical History:  Procedure Laterality Date   BACK SURGERY     CERVICAL FUSION  2000   C4-5   CHOLECYSTECTOMY     COLONOSCOPY     ligaments removed from elbow     PARTIAL HYSTERECTOMY     SKIN CANCER EXCISION     skin cancer removed     from face x 2, abdomen and arm left    TENDON REPAIR Right    TOTAL KNEE ARTHROPLASTY Right 07/23/2023   Procedure: TOTAL KNEE ARTHROPLASTY;  Surgeon: Ollen Gross, MD;  Location: [facility];  Service: Orthopedics;  Laterality: Right;   TUBAL LIGATION     Patient Active Problem List   Diagnosis Date Noted   Osteoarthritis of right knee 07/23/2023   Major depressive disorder with single episode, in partial remission (HCC) 07/22/2020   Altered mental status 07/23/2019   Cervical post-laminectomy syndrome 07/23/2019   Cervical spondylosis 07/23/2019   Lumbar facet joint syndrome 07/23/2019   Lumbosacral  spondylosis without myelopathy 07/23/2019   Myofascial pain 07/23/2019   Neck pain 07/23/2019   Osteoarthritis of hip 07/23/2019   Unspecified inflammatory spondylopathy, cervical region (HCC) 07/23/2019   Pain of right sacroiliac joint 10/07/2018   Anxiety 05/01/2017   Sleep walking 12/23/2014   Chronic neck and back pain 05/17/2014   Chronic recurrent major depressive disorder (HCC) 05/17/2014   DDD (degenerative disc disease), cervical 05/17/2014   Lumbar disc disease with radiculopathy 05/17/2014   PTSD (post-traumatic stress disorder) 05/17/2014   DIARRHEA 11/17/2009   REFERRING PROVIDER: Eartha Inch, PA   REFERRING DIAG: Osteoarthritis of right knee   THERAPY DIAG:  Acute pain of right knee  Localized edema  Stiffness of right knee, not elsewhere classified  Rationale for Evaluation and Treatment: Rehabilitation  ONSET DATE: 07/23/23  SUBJECTIVE:   SUBJECTIVE STATEMENT: Patient reports that her knee is still hurting, but she has been trying to do a lot when she is not at therapy. She is only able to stay 30 minutes today.   PERTINENT HISTORY:  Allergies, osteoarthritis, anxiety, chronic neck and back pain, PTSD, depression, asthma, history of cancer, fibromyalgia, glaucoma, history of a CVA, and history of seizures PAIN:  Are you having pain? Yes: NPRS scale: 8/10 Pain location: right hip radiating down to her knee Pain description: constant deep pain Aggravating factors: getting into and out of tub Relieving factors: heat   PRECAUTIONS: Fall  RED FLAGS: None   WEIGHT BEARING RESTRICTIONS: No  FALLS:  Has patient fallen in last 6 months? Yes. Number of falls 3+; she slipped at home and fell on her butt last week  LIVING ENVIRONMENT: Lives with: lives alone; has an aide that comes for 2.5 hours 3 times per week Lives in: House/apartment Stairs: No Has following equipment at home: Wheelchair (manual)  OCCUPATION: disabled  PLOF:  Independent  PATIENT GOALS: be able to walk, and reduced pain  NEXT MD VISIT: 09/24/23  OBJECTIVE:  Note: Objective measures were completed at Evaluation unless otherwise noted.  COGNITION: Overall cognitive status: Within functional limits for tasks assessed     SENSATION: Patient reports numbness on her right lateral knee since surgery.   EDEMA:  Circumferential: R tibiofemoral joint line: 44.7 cm Left tibiofemoral joint line: 41.5 cm Circumferential on 09/20/23: R tibiofemoral joint line: 46.2 cm L tibiofemoral joint line: 42.1 cm   PALPATION: TTP: right IT band and medial/lateral joint lines  LOWER EXTREMITY ROM:  Active ROM Right eval Right 09/20/23 Left eval  Hip flexion     Hip extension     Hip abduction     Hip adduction     Hip internal rotation     Hip external rotation     Knee flexion 86/106 (PROM) 110 133  Knee extension 26 17 1   Ankle dorsiflexion     Ankle plantarflexion     Ankle inversion     Ankle eversion      (Blank rows = not tested)  LOWER EXTREMITY MMT: not tested due to surgical condition  GAIT: Assistive device utilized: Wheelchair (manual) Level of assistance: Total A                                                                                                                              TREATMENT DATE:                                     10/05/23 EXERCISE LOG  Exercise Repetitions and Resistance Comments  Nustep  L4 x 15 minutes; seat 6   Rocker board  3 minutes   Standing HS stretch  4 x 30 seconds RLE only   Marching on foam  2 minutes   Lunges onto step  6" step x 15 reps  RLE only   Blank cell = exercise not performed today  09/28/23 EXERCISE LOG  Exercise Repetitions and Resistance Comments  Nustep  L4 x 15 minutes    Rocker board  2 minutes   Lunges onto step  8" step x 20 reps  For R knee flexion  LAQ 20 reps  RLE only   Standing HS curl  15 reps  RLE only    Blank cell = exercise not  performed today                                    09/20/23 EXERCISE LOG  Exercise Repetitions and Resistance Comments  Nustep  L4 x 15 minutes; seat 9   LAQ 3 minutes RLE only   Seated hip ADD isometric  30 reps w/ 5 second hold   Seated HS curl  Red t-band x 30 reps RLE only   ROM measurements      Blank cell = exercise not performed today   PATIENT EDUCATION:  Education details: healing and anatomy Person educated: Patient Education method: Explanation Education comprehension: verbalized understanding  HOME EXERCISE PROGRAM:   ASSESSMENT:  CLINICAL IMPRESSION: Patient was introduced to marching on foam and a standing hamstring stretch to facilitate quadriceps and improved hamstring extensibility needed for improved mobility. She required minimal cueing with marching on foam for improved foot clearance to promote increased time in single leg stance. She was unable to be progressed with other new or familiar interventions as she reported having to leave today's treatment early. She reported that her knee was "burning" upon the conclusion of treatment. She continues to require skilled physical therapy to address her remaining impairments to maximize her functional mobility.    OBJECTIVE IMPAIRMENTS: Abnormal gait, decreased activity tolerance, decreased balance, decreased mobility, difficulty walking, decreased ROM, decreased strength, hypomobility, increased edema, impaired sensation, impaired tone, and pain.   ACTIVITY LIMITATIONS: carrying, lifting, standing, squatting, sleeping, stairs, transfers, bed mobility, dressing, and locomotion level  PARTICIPATION LIMITATIONS: meal prep, cleaning, laundry, driving, shopping, and community activity  PERSONAL FACTORS: Past/current experiences, Time since onset of injury/illness/exacerbation, Transportation, and 3+ comorbidities: Allergies, osteoarthritis, anxiety, chronic neck and back pain, PTSD, depression, asthma, history of cancer,  fibromyalgia, glaucoma, history of a CVA, and history of seizures  are also affecting patient's functional outcome.   REHAB POTENTIAL: Fair    CLINICAL DECISION MAKING: Evolving/moderate complexity  EVALUATION COMPLEXITY: Moderate   GOALS: Goals reviewed with patient? Yes  LONG TERM GOALS: Target date: 09/11/23  Patient will be independent with her HEP.  Baseline:  Goal status: MET  2.  Patient will be able to demonstrate at least 120 degrees of right knee flexion for improved function navigating steps.  Baseline:  Goal status: On going  3.  Patient will improve her right knee extension to within 5 degrees of neutral for improved gait mechanics.  Baseline:  Goal status: On going  4.  Patient will be able to ambulate at least 80 feet with a walker or the least restrictive assistive device for improved household mobility.  Baseline:  Goal status   MET  5.  Patient will be able to perform a straight leg raise on the right lower extremity without an extension lag greater than 5 degrees for improved quadriceps strength.  Baseline:  Goal status: ON going  PLAN:  PT FREQUENCY: 2-3x/week  PT DURATION: 4 weeks  PLANNED INTERVENTIONS: 97164- PT Re-evaluation, 97110-Therapeutic exercises, 97530- Therapeutic activity, [id number]- Neuromuscular re-education, 97535- Self Care, [id number]-  Manual therapy, [id number]- Gait training, 16109- Electrical stimulation (unattended), 97016- Vasopneumatic device, Patient/Family education, Balance training, Stair training, Joint mobilization, Cryotherapy, and Moist heat  PLAN FOR NEXT SESSION: Nustep, heel slides, quad sets, manual therapy, provide HEP, and modalities as needed   Granville Lewis, PT 10/05/2023, 10:20 AM

## 2023-10-10 ENCOUNTER — Ambulatory Visit (HOSPITAL_COMMUNITY): Payer: 59 | Admitting: Psychiatry

## 2023-10-12 ENCOUNTER — Ambulatory Visit: Payer: 59

## 2023-10-12 DIAGNOSIS — R600 Localized edema: Secondary | ICD-10-CM

## 2023-10-12 DIAGNOSIS — M25661 Stiffness of right knee, not elsewhere classified: Secondary | ICD-10-CM

## 2023-10-12 DIAGNOSIS — M25561 Pain in right knee: Secondary | ICD-10-CM

## 2023-10-12 NOTE — Therapy (Signed)
 OUTPATIENT PHYSICAL THERAPY LOWER EXTREMITY TREATMENT   Patient Name: Carla Tran MRN: [id number] DOB:1951/04/26, 73 y.o., female Today's Date: 10/12/2023  END OF SESSION:  PT End of Session - 10/12/23 0944     Visit Number 9    Number of Visits 12    Date for PT Re-Evaluation 10/12/23    PT Start Time 0930    PT Stop Time 1000   Patient requested to leave early.   PT Time Calculation (min) 30 min    Activity Tolerance Patient tolerated treatment well    Behavior During Therapy WFL for tasks assessed/performed                 Past Medical History:  Diagnosis Date   Allergy    Anginal pain (HCC)    Anxiety    Asthma    Bursitis    shoulder   Cataract    forming    Cervical cancer (HCC)    Depression    DJD (degenerative joint disease)    Dyspnea    Dysrhythmia    Endometriosis    Fibromyalgia    Glaucoma    Head ache    Heart murmur    Hypertension    Irregular heart beat    PTSD (post-traumatic stress disorder)    PTSD (post-traumatic stress disorder)    Seizures (HCC)    x 1 only- in early 2000's and none since    Skin cancer    Stroke [facility])    Past Surgical History:  Procedure Laterality Date   BACK SURGERY     CERVICAL FUSION  2000   C4-5   CHOLECYSTECTOMY     COLONOSCOPY     ligaments removed from elbow     PARTIAL HYSTERECTOMY     SKIN CANCER EXCISION     skin cancer removed     from face x 2, abdomen and arm left    TENDON REPAIR Right    TOTAL KNEE ARTHROPLASTY Right 07/23/2023   Procedure: TOTAL KNEE ARTHROPLASTY;  Surgeon: Ollen Gross, MD;  Location: [facility];  Service: Orthopedics;  Laterality: Right;   TUBAL LIGATION     Patient Active Problem List   Diagnosis Date Noted   Osteoarthritis of right knee 07/23/2023   Major depressive disorder with single episode, in partial remission (HCC) 07/22/2020   Altered mental status 07/23/2019   Cervical post-laminectomy syndrome 07/23/2019   Cervical spondylosis 07/23/2019   Lumbar  facet joint syndrome 07/23/2019   Lumbosacral spondylosis without myelopathy 07/23/2019   Myofascial pain 07/23/2019   Neck pain 07/23/2019   Osteoarthritis of hip 07/23/2019   Unspecified inflammatory spondylopathy, cervical region (HCC) 07/23/2019   Pain of right sacroiliac joint 10/07/2018   Anxiety 05/01/2017   Sleep walking 12/23/2014   Chronic neck and back pain 05/17/2014   Chronic recurrent major depressive disorder (HCC) 05/17/2014   DDD (degenerative disc disease), cervical 05/17/2014   Lumbar disc disease with radiculopathy 05/17/2014   PTSD (post-traumatic stress disorder) 05/17/2014   DIARRHEA 11/17/2009   REFERRING PROVIDER: Eartha Inch, PA   REFERRING DIAG: Osteoarthritis of right knee   THERAPY DIAG:  Acute pain of right knee  Localized edema  Stiffness of right knee, not elsewhere classified  Rationale for Evaluation and Treatment: Rehabilitation  ONSET DATE: 07/23/23  SUBJECTIVE:   SUBJECTIVE STATEMENT: Patient reports that she feels better than she did previously. However, she can only stay for 30 minutes.   PERTINENT HISTORY: Allergies, osteoarthritis, anxiety, chronic neck and back  pain, PTSD, depression, asthma, history of cancer, fibromyalgia, glaucoma, history of a CVA, and history of seizures PAIN:  Are you having pain? Yes: NPRS scale: no pain score provided Pain location: right hip radiating down to her knee Pain description: constant deep pain Aggravating factors: getting into and out of tub Relieving factors: heat   PRECAUTIONS: Fall  RED FLAGS: None   WEIGHT BEARING RESTRICTIONS: No  FALLS:  Has patient fallen in last 6 months? Yes. Number of falls 3+; she slipped at home and fell on her butt last week  LIVING ENVIRONMENT: Lives with: lives alone; has an aide that comes for 2.5 hours 3 times per week Lives in: House/apartment Stairs: No Has following equipment at home: Wheelchair (manual)  OCCUPATION: disabled  PLOF:  Independent  PATIENT GOALS: be able to walk, and reduced pain  NEXT MD VISIT: date unknown (between 10/22/23-10/26/23)  OBJECTIVE:  Note: Objective measures were completed at Evaluation unless otherwise noted.  COGNITION: Overall cognitive status: Within functional limits for tasks assessed     SENSATION: Patient reports numbness on her right lateral knee since surgery.   EDEMA:  Circumferential: R tibiofemoral joint line: 44.7 cm Left tibiofemoral joint line: 41.5 cm Circumferential on 09/20/23: R tibiofemoral joint line: 46.2 cm L tibiofemoral joint line: 42.1 cm   PALPATION: TTP: right IT band and medial/lateral joint lines  LOWER EXTREMITY ROM:  Active ROM Right eval Right 09/20/23 Left eval  Hip flexion     Hip extension     Hip abduction     Hip adduction     Hip internal rotation     Hip external rotation     Knee flexion 86/106 (PROM) 110 133  Knee extension 26 17 1   Ankle dorsiflexion     Ankle plantarflexion     Ankle inversion     Ankle eversion      (Blank rows = not tested)  LOWER EXTREMITY MMT: not tested due to surgical condition  GAIT: Assistive device utilized: Wheelchair (manual) Level of assistance: Total A                                                                                                                              TREATMENT DATE:                                     10/12/23 EXERCISE LOG  Exercise Repetitions and Resistance Comments  Nustep  L3-4 x 15 minutes; seat 6-5   Standing HS stretch  4 x 30 seconds RLE only   Step up  6" step x 20 reps  Leading with RLE  Rocker board  3 minutes         Blank cell = exercise not performed today  10/05/23 EXERCISE LOG  Exercise Repetitions and Resistance Comments  Nustep  L4 x 15 minutes; seat 6   Rocker board  3 minutes   Standing HS stretch  4 x 30 seconds RLE only   Marching on foam  2 minutes   Lunges onto step  6" step x 15 reps  RLE only   Blank  cell = exercise not performed today                                    09/28/23 EXERCISE LOG  Exercise Repetitions and Resistance Comments  Nustep  L4 x 15 minutes    Rocker board  2 minutes   Lunges onto step  8" step x 20 reps  For R knee flexion  LAQ 20 reps  RLE only   Standing HS curl  15 reps  RLE only    Blank cell = exercise not performed today   PATIENT EDUCATION:  Education details: healing and anatomy Person educated: Patient Education method: Explanation Education comprehension: verbalized understanding  HOME EXERCISE PROGRAM:   ASSESSMENT:  CLINICAL IMPRESSION: Patient was progressed with step ups for improved function navigating steps. She required minimal cueing with a standing hamstring stretch for proper positioning to facilitate improved hamstring soft tissue extensibility. Her treatment was limited by her inability to attend the full physical therapy appointment as she had to leave early. She reported that her knee felt alright upon the conclusion of treatment. She continues to require skilled physical therapy to address her remaining impairments to return to her prior level of function.   OBJECTIVE IMPAIRMENTS: Abnormal gait, decreased activity tolerance, decreased balance, decreased mobility, difficulty walking, decreased ROM, decreased strength, hypomobility, increased edema, impaired sensation, impaired tone, and pain.   ACTIVITY LIMITATIONS: carrying, lifting, standing, squatting, sleeping, stairs, transfers, bed mobility, dressing, and locomotion level  PARTICIPATION LIMITATIONS: meal prep, cleaning, laundry, driving, shopping, and community activity  PERSONAL FACTORS: Past/current experiences, Time since onset of injury/illness/exacerbation, Transportation, and 3+ comorbidities: Allergies, osteoarthritis, anxiety, chronic neck and back pain, PTSD, depression, asthma, history of cancer, fibromyalgia, glaucoma, history of a CVA, and history of seizures  are also  affecting patient's functional outcome.   REHAB POTENTIAL: Fair    CLINICAL DECISION MAKING: Evolving/moderate complexity  EVALUATION COMPLEXITY: Moderate   GOALS: Goals reviewed with patient? Yes  LONG TERM GOALS: Target date: 09/11/23  Patient will be independent with her HEP.  Baseline:  Goal status: MET  2.  Patient will be able to demonstrate at least 120 degrees of right knee flexion for improved function navigating steps.  Baseline:  Goal status: On going  3.  Patient will improve her right knee extension to within 5 degrees of neutral for improved gait mechanics.  Baseline:  Goal status: On going  4.  Patient will be able to ambulate at least 80 feet with a walker or the least restrictive assistive device for improved household mobility.  Baseline:  Goal status   MET  5.  Patient will be able to perform a straight leg raise on the right lower extremity without an extension lag greater than 5 degrees for improved quadriceps strength.  Baseline:  Goal status: ON going  PLAN:  PT FREQUENCY: 2-3x/week  PT DURATION: 4 weeks  PLANNED INTERVENTIONS: 97164- PT Re-evaluation, 97110-Therapeutic exercises, 97530- Therapeutic activity, 97112- Neuromuscular re-education, 97535- Self Care, 16109- Manual therapy, [id number]- Gait training, 97014- Electrical stimulation (unattended), 97016- Vasopneumatic  device, Patient/Family education, Balance training, Stair training, Joint mobilization, Cryotherapy, and Moist heat  PLAN FOR NEXT SESSION: Nustep, heel slides, quad sets, manual therapy, provide HEP, and modalities as needed   Granville Lewis, PT 10/12/2023, 1:01 PM

## 2023-10-16 ENCOUNTER — Ambulatory Visit (INDEPENDENT_AMBULATORY_CARE_PROVIDER_SITE_OTHER): Payer: 59 | Admitting: Licensed Clinical Social Worker

## 2023-10-16 DIAGNOSIS — F411 Generalized anxiety disorder: Secondary | ICD-10-CM | POA: Diagnosis not present

## 2023-10-16 DIAGNOSIS — F329 Major depressive disorder, single episode, unspecified: Secondary | ICD-10-CM

## 2023-10-16 DIAGNOSIS — F4312 Post-traumatic stress disorder, chronic: Secondary | ICD-10-CM
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
## 2023-10-26 ENCOUNTER — Encounter

## 2023-11-02 ENCOUNTER — Other Ambulatory Visit (HOSPITAL_COMMUNITY): Payer: Self-pay | Admitting: Psychiatry

## 2023-11-07 ENCOUNTER — Other Ambulatory Visit (HOSPITAL_COMMUNITY): Payer: Self-pay | Admitting: Psychiatry

## 2023-11-07 MED ORDER — ALPRAZOLAM 0.25 MG PO TABS: ORAL_TABLET | ORAL | 3 refills | Status: DC

## 2023-11-07 MED ORDER — ESCITALOPRAM OXALATE 10 MG PO TABS: 10.0000 mg | ORAL_TABLET | Freq: Every day | ORAL | 5 refills | Status: DC

## 2023-11-09 ENCOUNTER — Ambulatory Visit: Attending: Student

## 2023-11-09 DIAGNOSIS — R600 Localized edema: Secondary | ICD-10-CM | POA: Insufficient documentation

## 2023-11-09 DIAGNOSIS — M25561 Pain in right knee: Secondary | ICD-10-CM | POA: Diagnosis present

## 2023-11-09 DIAGNOSIS — M25661 Stiffness of right knee, not elsewhere classified: Secondary | ICD-10-CM | POA: Diagnosis present

## 2023-11-09 NOTE — Therapy (Signed)
 OUTPATIENT PHYSICAL THERAPY LOWER EXTREMITY TREATMENT   Patient Name: Carla Tran MRN: [id number] DOB:November 21, 1950, 73 y.o., female Today's Date: 11/09/2023  END OF SESSION:  PT End of Session - 11/09/23 0940     Visit Number 10    Number of Visits 12    Date for PT Re-Evaluation 10/12/23    PT Start Time 0930    PT Stop Time 1003    PT Time Calculation (min) 33 min    Activity Tolerance Patient tolerated treatment well    Behavior During Therapy WFL for tasks assessed/performed                  Past Medical History:  Diagnosis Date   Allergy    Anginal pain (HCC)    Anxiety    Asthma    Bursitis    shoulder   Cataract    forming    Cervical cancer (HCC)    Depression    DJD (degenerative joint disease)    Dyspnea    Dysrhythmia    Endometriosis    Fibromyalgia    Glaucoma    Head ache    Heart murmur    Hypertension    Irregular heart beat    PTSD (post-traumatic stress disorder)    PTSD (post-traumatic stress disorder)    Seizures (HCC)    x 1 only- in early 2000's and none since    Skin cancer    Stroke [facility])    Past Surgical History:  Procedure Laterality Date   BACK SURGERY     CERVICAL FUSION  2000   C4-5   CHOLECYSTECTOMY     COLONOSCOPY     ligaments removed from elbow     PARTIAL HYSTERECTOMY     SKIN CANCER EXCISION     skin cancer removed     from face x 2, abdomen and arm left    TENDON REPAIR Right    TOTAL KNEE ARTHROPLASTY Right 07/23/2023   Procedure: TOTAL KNEE ARTHROPLASTY;  Surgeon: Ollen Gross, MD;  Location: [facility];  Service: Orthopedics;  Laterality: Right;   TUBAL LIGATION     Patient Active Problem List   Diagnosis Date Noted   Osteoarthritis of right knee 07/23/2023   Major depressive disorder with single episode, in partial remission (HCC) 07/22/2020   Altered mental status 07/23/2019   Cervical post-laminectomy syndrome 07/23/2019   Cervical spondylosis 07/23/2019   Lumbar facet joint syndrome 07/23/2019    Lumbosacral spondylosis without myelopathy 07/23/2019   Myofascial pain 07/23/2019   Neck pain 07/23/2019   Osteoarthritis of hip 07/23/2019   Unspecified inflammatory spondylopathy, cervical region (HCC) 07/23/2019   Pain of right sacroiliac joint 10/07/2018   Anxiety 05/01/2017   Sleep walking 12/23/2014   Chronic neck and back pain 05/17/2014   Chronic recurrent major depressive disorder (HCC) 05/17/2014   DDD (degenerative disc disease), cervical 05/17/2014   Lumbar disc disease with radiculopathy 05/17/2014   PTSD (post-traumatic stress disorder) 05/17/2014   DIARRHEA 11/17/2009   REFERRING PROVIDER: Eartha Inch, PA   REFERRING DIAG: Osteoarthritis of right knee   THERAPY DIAG:  Acute pain of right knee  Localized edema  Stiffness of right knee, not elsewhere classified  Rationale for Evaluation and Treatment: Rehabilitation  ONSET DATE: 07/23/23  SUBJECTIVE:   SUBJECTIVE STATEMENT: Patient reports that she has some "pressure" in her thighs, but she got a good report from Dr. Despina Hick. She requested to be discharged at this time.   PERTINENT HISTORY: Allergies, osteoarthritis, anxiety,  chronic neck and back pain, PTSD, depression, asthma, history of cancer, fibromyalgia, glaucoma, history of a CVA, and history of seizures PAIN:  Are you having pain? Yes: NPRS scale: no pain score provided Pain location: right hip radiating down to her knee Pain description: constant deep pain Aggravating factors: getting into and out of tub Relieving factors: heat   PRECAUTIONS: Fall  RED FLAGS: None   WEIGHT BEARING RESTRICTIONS: No  FALLS:  Has patient fallen in last 6 months? Yes. Number of falls 3+; she slipped at home and fell on her butt last week  LIVING ENVIRONMENT: Lives with: lives alone; has an aide that comes for 2.5 hours 3 times per week Lives in: House/apartment Stairs: No Has following equipment at home: Wheelchair (manual)  OCCUPATION:  disabled  PLOF: Independent  PATIENT GOALS: be able to walk, and reduced pain  NEXT MD VISIT: date unknown (between 10/22/23-10/26/23)  OBJECTIVE:  Note: Objective measures were completed at Evaluation unless otherwise noted.  COGNITION: Overall cognitive status: Within functional limits for tasks assessed     SENSATION: Patient reports numbness on her right lateral knee since surgery.   EDEMA:  Circumferential: R tibiofemoral joint line: 44.7 cm Left tibiofemoral joint line: 41.5 cm Circumferential on 09/20/23: R tibiofemoral joint line: 46.2 cm L tibiofemoral joint line: 42.1 cm   PALPATION: TTP: right IT band and medial/lateral joint lines  LOWER EXTREMITY ROM:  Active ROM Right eval Right 09/20/23 Left eval  Hip flexion     Hip extension     Hip abduction     Hip adduction     Hip internal rotation     Hip external rotation     Knee flexion 86/106 (PROM) 110 133  Knee extension 26 17 1   Ankle dorsiflexion     Ankle plantarflexion     Ankle inversion     Ankle eversion      (Blank rows = not tested)  LOWER EXTREMITY MMT: not tested due to surgical condition  GAIT: Assistive device utilized: Wheelchair (manual) Level of assistance: Total A                                                                                                                              TREATMENT DATE:                                     11/09/23 EXERCISE LOG  Exercise Repetitions and Resistance Comments  Nustep  L4 x 15 minutes @ seat 6   SLR  15 reps  RLE only   Bridge  10 reps    Rocker board  2.5 minutes        Blank cell = exercise not performed today  10/12/23 EXERCISE LOG  Exercise Repetitions and Resistance Comments  Nustep  L3-4 x 15 minutes; seat 6-5   Standing HS stretch  4 x 30 seconds RLE only   Step up  6" step x 20 reps  Leading with RLE  Rocker board  3 minutes         Blank cell = exercise not performed today                                     10/05/23 EXERCISE LOG  Exercise Repetitions and Resistance Comments  Nustep  L4 x 15 minutes; seat 6   Rocker board  3 minutes   Standing HS stretch  4 x 30 seconds RLE only   Marching on foam  2 minutes   Lunges onto step  6" step x 15 reps  RLE only   Blank cell = exercise not performed today   PATIENT EDUCATION:  Education details: healing and anatomy Person educated: Patient Education method: Explanation Education comprehension: verbalized understanding  HOME EXERCISE PROGRAM:   ASSESSMENT:  CLINICAL IMPRESSION: Patient has made good progress with skilled physical therapy as evidenced by her subjective reports, objective measures, functional mobility, and progress toward her goals. She was able to meet most of her goals for skilled physical therapy.  However, she was unable to meet her active right knee extension goal. She was encouraged to continue with her home exercise program to address this remaining impairment. She felt comfortable being discharged at this time with her home exercise program.    PHYSICAL THERAPY DISCHARGE SUMMARY  Visits from Start of Care: 10  Current functional level related to goals / functional outcomes: Patient was able to meet most of her goals for skilled physical therapy.   Remaining deficits: Right knee extension active range of motion   Education / Equipment: HEP  Patient agrees to discharge. Patient goals were partially met. Patient is being discharged due to being pleased with the current functional level.   OBJECTIVE IMPAIRMENTS: Abnormal gait, decreased activity tolerance, decreased balance, decreased mobility, difficulty walking, decreased ROM, decreased strength, hypomobility, increased edema, impaired sensation, impaired tone, and pain.   ACTIVITY LIMITATIONS: carrying, lifting, standing, squatting, sleeping, stairs, transfers, bed mobility, dressing, and locomotion level  PARTICIPATION LIMITATIONS: meal prep,  cleaning, laundry, driving, shopping, and community activity  PERSONAL FACTORS: Past/current experiences, Time since onset of injury/illness/exacerbation, Transportation, and 3+ comorbidities: Allergies, osteoarthritis, anxiety, chronic neck and back pain, PTSD, depression, asthma, history of cancer, fibromyalgia, glaucoma, history of a CVA, and history of seizures  are also affecting patient's functional outcome.   REHAB POTENTIAL: Fair    CLINICAL DECISION MAKING: Evolving/moderate complexity  EVALUATION COMPLEXITY: Moderate   GOALS: Goals reviewed with patient? Yes  LONG TERM GOALS: Target date: 09/11/23  Patient will be independent with her HEP.  Baseline:  Goal status: MET  2.  Patient will be able to demonstrate at least 120 degrees of right knee flexion for improved function navigating steps.  Baseline: 122 degrees on 11/09/23 Goal status: MET  3.  Patient will improve her right knee extension to within 5 degrees of neutral for improved gait mechanics.  Baseline: 12 degrees from neutral Goal status: NOT MET  4.  Patient will be able to ambulate at least 80 feet with a walker or the least restrictive assistive device for improved household mobility.  Baseline:  Goal status   MET  5.  Patient will be able to perform a straight leg raise on the right lower extremity without an extension lag greater than 5 degrees for improved quadriceps strength.  Baseline: No extension lag observed Goal status: MET  PLAN:  PT FREQUENCY: 2-3x/week  PT DURATION: 4 weeks  PLANNED INTERVENTIONS: 97164- PT Re-evaluation, 97110-Therapeutic exercises, 97530- Therapeutic activity, 97112- Neuromuscular re-education, 97535- Self Care, 86578- Manual therapy, 97116- Gait training, 97014- Electrical stimulation (unattended), 97016- Vasopneumatic device, Patient/Family education, Balance training, Stair training, Joint mobilization, Cryotherapy, and Moist heat  PLAN FOR NEXT SESSION: Nustep, heel  slides, quad sets, manual therapy, provide HEP, and modalities as needed   Granville Lewis, PT 11/09/2023, 2:01 PM

## 2023-11-14 ENCOUNTER — Encounter (HOSPITAL_COMMUNITY): Payer: Self-pay | Admitting: Psychiatry

## 2023-11-14 ENCOUNTER — Other Ambulatory Visit: Payer: Self-pay

## 2023-11-14 ENCOUNTER — Ambulatory Visit (HOSPITAL_BASED_OUTPATIENT_CLINIC_OR_DEPARTMENT_OTHER): Payer: 59 | Admitting: Psychiatry

## 2023-11-14 VITALS — BP 155/84 | HR 89 | Ht 62.0 in | Wt 174.0 lb

## 2023-11-14 DIAGNOSIS — F411 Generalized anxiety disorder: Secondary | ICD-10-CM

## 2023-11-14 MED ORDER — GABAPENTIN 300 MG PO CAPS: ORAL_CAPSULE | ORAL | 4 refills | Status: AC

## 2023-11-14 MED ORDER — HYDROXYZINE PAMOATE 25 MG PO CAPS: ORAL_CAPSULE | ORAL | 5 refills | Status: DC

## 2023-11-14 MED ORDER — ESCITALOPRAM OXALATE 10 MG PO TABS: 10.0000 mg | ORAL_TABLET | Freq: Every day | ORAL | 5 refills | Status: DC

## 2023-11-14 MED ORDER — ALPRAZOLAM 0.25 MG PO TABS: ORAL_TABLET | ORAL | 4 refills | Status: DC

## 2023-11-14 NOTE — Progress Notes (Signed)
 Psychiatric Initial Adult Assessment   Patient Identification: Carla Tran MRN:  [id number] Date of Evaluation:  11/14/2023 Referral Source: Dr. Shawn Tran Chief Complaint:    Visit Diagnosis: Major depression chronic  History of Present     Today the patient is seen in the office.  She actually is doing quite well.  She had a right knee replaced and has gone through rehab and is walking well.  She feels good.  She denies daily depression.  Her son has gone into alcohol treatment he is going through detox and I think he is doing better.  The patient's children are adults and doing better.  The patient is sleeping and eating well.  She has got good energy.  She is very independent.  She rents her house but she likes the house she has been living in.  Financially she is stable.  Overall her Tran is good.   I connected with Carla Tran on 11/14/23 at  2:00 PM EDT by telephone and verified that I am speaking with the correct person using two identifiers.  Location: Patient: home Provider: office   I discussed the limitations, risks, security and privacy concerns of performing an evaluation and management service by telephone and the availability of in person appointments. I also discussed with the patient that there may be a patient responsible charge related to this service. The patient expressed understanding and agreed to proceed. :    I discussed the assessment and treatment plan with the patient. The patient was provided an opportunity to ask questions and all were answered. The patient agreed with the plan and demonstrated an understanding of the instructions.   The patient was advised to call back or seek an in-person evaluation if the symptoms worsen or if the condition fails to improve as anticipated.  I provided 30 minutes of non-face-to-face time during this encounter.   Carla Balsam, MD   Depression Symptoms:  disturbed sleep, (Hypo) Manic Symptoms:   Anxiety  Symptoms:   Psychotic Symptoms:   PTSD Symptoms:   Past Psychiatric History: Under the care of Dr. Yetta Tran and hospitalized for alcohol treatment  Previous Psychotropic Medications: Xanax 0.5 mg 4 times daily, Celexa 40 mg, Seroquel unclear dose  Substance Abuse History in the last 12 months:  Yes.    Consequences of Substance Abuse:   Past Medical History:  Past Medical History:  Diagnosis Date   Allergy    Anginal pain (HCC)    Anxiety    Asthma    Bursitis    shoulder   Cataract    forming    Cervical cancer (HCC)    Depression    DJD (degenerative joint disease)    Dyspnea    Dysrhythmia    Endometriosis    Fibromyalgia    Glaucoma    Head ache    Heart murmur    Hypertension    Irregular heart beat    PTSD (post-traumatic stress disorder)    PTSD (post-traumatic stress disorder)    Seizures (HCC)    x 1 only- in early 2000's and none since    Skin cancer    Stroke [facility])     Past Surgical History:  Procedure Laterality Date   BACK SURGERY     CERVICAL FUSION  2000   C4-5   CHOLECYSTECTOMY     COLONOSCOPY     ligaments removed from elbow     PARTIAL HYSTERECTOMY     SKIN CANCER EXCISION  skin cancer removed     from face x 2, abdomen and arm left    TENDON REPAIR Right    TOTAL KNEE ARTHROPLASTY Right 07/23/2023   Procedure: TOTAL KNEE ARTHROPLASTY;  Surgeon: Carla Gross, MD;  Location: [facility];  Service: Orthopedics;  Laterality: Right;   TUBAL LIGATION      Family Psychiatric History:   Family History:  Family History  Problem Relation Age of Onset   Diabetes Mother    Prostate cancer Father    Breast cancer Maternal Aunt    Diabetes Sister    Esophageal cancer Sister    Colon cancer Neg Hx    Rectal cancer Neg Hx    Stomach cancer Neg Hx    Colon polyps Neg Hx     Social History:   Social History   Socioeconomic History   Marital status: Divorced    Spouse name: Not on file   Number of children: 2   Years of education: Not  on file   Highest education level: Not on file  Occupational History   Occupation: disabled  Tobacco Use   Smoking status: Never   Smokeless tobacco: Never  Vaping Use   Vaping status: Never Used  Substance and Sexual Activity   Alcohol use: Not Currently   Drug use: Not Currently    Frequency: 1.0 times per week    Types: Marijuana   Sexual activity: Not Currently  Other Topics Concern   Not on file  Social History Narrative   Not on file   Social Drivers of Tran   Financial Resource Strain: Low Risk  (09/10/2023)   Received from Carla Tran   Overall Financial Resource Strain (CARDIA)    Difficulty of Paying Living Expenses: Not hard at all  Food Insecurity: No Food Insecurity (09/10/2023)   Received from Carla Tran   Hunger Vital Sign    Worried About Running Out of Food in the Last Year: Never true    Ran Out of Food in the Last Year: Never true  Transportation Needs: No Transportation Needs (09/10/2023)   Received from Carla Tran - Transportation    Lack of Transportation (Medical): No    Lack of Transportation (Non-Medical): No  Recent Concern: Transportation Needs - Unmet Transportation Needs (07/23/2023)   PRAPARE - Transportation    Lack of Transportation (Medical): Yes    Lack of Transportation (Non-Medical): Yes  Physical Activity: Sufficiently Active (06/06/2023)   Received from Carla Tran [place]   Exercise Vital Sign    Days of Exercise per Week: 5 days    Minutes of Exercise per Session: 30 min  Stress: Stress Concern Present (06/06/2023)   Received from Carla Tran - Occupational Stress Questionnaire    Feeling of Stress : Very much  Social Connections: Socially Isolated (06/06/2023)   Received from Carla Tran   Social Network    How would you rate your social network (family, work, friends)?: Little participation, lonely and socially isolated    Additional Social History:   Allergies:    Allergies  Allergen Reactions   Penicillins Anaphylaxis   Haemophilus Influenzae Vaccines Rash   Mirtazapine Other (See Comments)    Pt states this medication made her sleep walk.   Darvon [Propoxyphene] Other (See Comments)    Unknown reaction   Sulfonamide Derivatives Other (See Comments)    Stomach Spasms    Codeine Itching and Rash   Ivp Dye [Iodinated Contrast Media]  Rash    Metabolic Disorder Labs: Lab Results  Component Value Date   HGBA1C 5.6 07/23/2023   MPG 114.02 07/23/2023   No results found for: "PROLACTIN" No results found for: "CHOL", "TRIG", "HDL", "CHOLHDL", "VLDL", "LDLCALC" Lab Results  Component Value Date   TSH 0.81 11/17/2009    Therapeutic Level Labs: No results found for: "LITHIUM" No results found for: "CBMZ" No results found for: "VALPROATE"  Current Medications: Current Outpatient Medications  Medication Sig Dispense Refill   cyclobenzaprine (FLEXERIL) 10 MG tablet Take 1 tablet (10 mg total) by mouth 3 (three) times daily as needed for muscle spasms. 40 tablet 0   dextromethorphan-guaiFENesin (MUCINEX DM) 30-600 MG 12hr tablet Take 1 tablet by mouth 2 (two) times daily.     fluocinonide (LIDEX) 0.05 % external solution Apply 1 Application topically daily.     ipratropium (ATROVENT) 0.06 % nasal spray Place 2 sprays into both nostrils 2 (two) times daily.     latanoprost (XALATAN) 0.005 % ophthalmic solution Place 1 drop into both eyes at bedtime.     montelukast (SINGULAIR) 10 MG tablet Take 10 mg by mouth daily.     Multiple Vitamins-Minerals (MULTIVITAMIN WITH MINERALS) tablet Take 1 tablet by mouth daily.     oxyCODONE (OXY IR/ROXICODONE) 5 MG immediate release tablet Take 1-2 tablets (5-10 mg total) by mouth every 6 (six) hours as needed for severe pain (pain score 7-10). 42 tablet 0   PATADAY 0.2 % SOLN Place 1 drop into both eyes 2 (two) times daily.     pregabalin (LYRICA) 25 MG capsule Take 25 mg by mouth 3 (three) times daily.      risankizumab-rzaa (SKYRIZI) 150 MG/ML SOSY prefilled syringe Inject 150 mg into the skin every 3 (three) months.     ALPRAZolam (XANAX) 0.25 MG tablet 1  TID 90 tablet 4   dicyclomine (BENTYL) 10 MG capsule Take 10 mg by mouth 2 (two) times daily.     escitalopram (LEXAPRO) 10 MG tablet Take 1 tablet (10 mg total) by mouth daily. 30 tablet 5   gabapentin (NEURONTIN) 300 MG capsule 1 qam  1 q midday 2 qhs 120 capsule 4   hydrOXYzine (VISTARIL) 25 MG capsule 1 qam  2  qhs 90 capsule 5   No current facility-administered medications for this visit.    Musculoskeletal: Strength & Muscle Tone: Normal gait & Station: normal Patient leans: N/A  Psychiatric Specialty Exam: ROS  Blood pressure (!) 155/84, pulse 89, height 5\' 2"  (1.575 m), weight 174 lb (78.9 kg).Body mass index is 31.83 kg/m.  General Appearance: Casual  Eye Contact:  Good  Speech:  Clear and Coherent  Volume:  Normal  Mood:  Negative  Affect:  Congruent  Thought Process:  Goal Directed  Orientation:  Full (Time, Place, and Person)  Thought Content:  WDL  Suicidal Thoughts:  No  Homicidal Thoughts:  No  Memory:  Negative  Judgement:  Fair  Insight:  Fair  Psychomotor Activity:  Normal  Concentration:    Recall:  Poor  Fund of Knowledge:Poor  Language: Fair  Akathisia:  No  Handed:  Right  AIMS (if indicated):  not done  Assets:  Desire for Improvement  ADL's:    Cognition: Impaired,  Mild  Sleep:  Fair   Screenings: Flowsheet Row Admission (Discharged) from 07/23/2023 in [facility] from [facility]/18/2022 in [facility]  C-SSRS RISK CATEGORY No Risk No Risk  Assessment and Plan: 11/14/2023    This patient's diagnosis is generalized anxiety disorder.  At this time she will continue taking the Lexapro 10 mg for this condition.  She also takes Vistaril 25 mg 1 in the morning and 2 at night.  In addition she takes Xanax 0.25 mg 3 times daily.  And  she takes gabapentin 300 mg 1 in the morning and 2 at night.  She is not over sedated.  She has had no falls.  She feels very even uncomfortable.  Her anxiety is well controlled at this time.  She will return to see me again in 4 months.

## 2023-12-11 ENCOUNTER — Ambulatory Visit (INDEPENDENT_AMBULATORY_CARE_PROVIDER_SITE_OTHER): Admitting: Licensed Clinical Social Worker

## 2023-12-11 DIAGNOSIS — F4312 Post-traumatic stress disorder, chronic: Secondary | ICD-10-CM | POA: Diagnosis not present

## 2023-12-11 DIAGNOSIS — F411 Generalized anxiety disorder: Secondary | ICD-10-CM

## 2023-12-11 DIAGNOSIS — F329 Major depressive disorder, single episode, unspecified: Secondary | ICD-10-CM
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
## 2024-02-04 ENCOUNTER — Ambulatory Visit (INDEPENDENT_AMBULATORY_CARE_PROVIDER_SITE_OTHER): Admitting: Licensed Clinical Social Worker

## 2024-02-04 DIAGNOSIS — F4312 Post-traumatic stress disorder, chronic: Secondary | ICD-10-CM | POA: Diagnosis not present

## 2024-02-04 DIAGNOSIS — F411 Generalized anxiety disorder: Secondary | ICD-10-CM

## 2024-02-04 DIAGNOSIS — F329 Major depressive disorder, single episode, unspecified: Secondary | ICD-10-CM
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
## 2024-03-04 ENCOUNTER — Other Ambulatory Visit (HOSPITAL_COMMUNITY): Payer: Self-pay | Admitting: Psychiatry

## 2024-03-18 ENCOUNTER — Ambulatory Visit (HOSPITAL_BASED_OUTPATIENT_CLINIC_OR_DEPARTMENT_OTHER): Admitting: Psychiatry

## 2024-03-18 VITALS — BP 138/88 | HR 77 | Ht 62.0 in | Wt 174.0 lb

## 2024-03-18 DIAGNOSIS — F411 Generalized anxiety disorder: Secondary | ICD-10-CM

## 2024-03-18 MED ORDER — ESCITALOPRAM OXALATE 10 MG PO TABS: 10.0000 mg | ORAL_TABLET | Freq: Every day | ORAL | 5 refills | Status: DC

## 2024-03-18 MED ORDER — HYDROXYZINE PAMOATE 25 MG PO CAPS: ORAL_CAPSULE | ORAL | 5 refills | Status: DC

## 2024-03-18 MED ORDER — ALPRAZOLAM 0.25 MG PO TABS: ORAL_TABLET | ORAL | 4 refills | Status: AC

## 2024-03-18 NOTE — Progress Notes (Signed)
 Psychiatric Initial Adult Assessment   Patient Identification: Carla Tran MRN:  [id number] Date of Evaluation:  03/18/2024 Referral Source: Dr. Gwynneth Chief Complaint:    Visit Diagnosis: Major depression chronic  History of Present      Today the patient is at her baseline.  She had a little trouble getting here but she got.  She no longer drives.  She is dependent upon transportation from the system.  The patient says that she seems to feel more isolated.  She has had some trouble getting out.  She says her anxiety level is somewhat higher but she does not know why.  The patient had successful knee surgery.  Which is great.  Her son is doing well at this time.  He is still in rehab.  The patient denies persistent depression.  She generally is sleeping and eating pretty well.  She really is at her baseline.  I cannot quite explain why she is having difficulty getting out of her house but I think there is most likely transient.  I suspect it also might be related to the heat that now is going away..   I connected with Tilton Sharps on 03/18/24 at  2:30 PM EDT by telephone and verified that I am speaking with the correct person using two identifiers.  Location: Patient: home Provider: office   I discussed the limitations, risks, security and privacy concerns of performing an evaluation and management service by telephone and the availability of in person appointments. I also discussed with the patient that there may be a patient responsible charge related to this service. The patient expressed understanding and agreed to proceed. :    I discussed the assessment and treatment plan with the patient. The patient was provided an opportunity to ask questions and all were answered. The patient agreed with the plan and demonstrated an understanding of the instructions.   The patient was advised to call back or seek an in-person evaluation if the symptoms worsen or if the condition fails  to improve as anticipated.  I provided 30 minutes of non-face-to-face time during this encounter.   Elna LILLETTE Lo, MD   Depression Symptoms:  disturbed sleep, (Hypo) Manic Symptoms:   Anxiety Symptoms:   Psychotic Symptoms:   PTSD Symptoms:   Past Psychiatric History: Under the care of Dr. Joshua and hospitalized for alcohol treatment  Previous Psychotropic Medications: Xanax  0.5 mg 4 times daily, Celexa  40 mg, Seroquel unclear dose  Substance Abuse History in the last 12 months:  Yes.    Consequences of Substance Abuse:   Past Medical History:  Past Medical History:  Diagnosis Date   Allergy    Anginal pain (HCC)    Anxiety    Asthma    Bursitis    shoulder   Cataract    forming    Cervical cancer (HCC)    Depression    DJD (degenerative joint disease)    Dyspnea    Dysrhythmia    Endometriosis    Fibromyalgia    Glaucoma    Head ache    Heart murmur    Hypertension    Irregular heart beat    PTSD (post-traumatic stress disorder)    PTSD (post-traumatic stress disorder)    Seizures (HCC)    x 1 only- in early 2000's and none since    Skin cancer    Stroke [facility])     Past Surgical History:  Procedure Laterality Date   BACK SURGERY  CERVICAL FUSION  2000   C4-5   CHOLECYSTECTOMY     COLONOSCOPY     ligaments removed from elbow     PARTIAL HYSTERECTOMY     SKIN CANCER EXCISION     skin cancer removed     from face x 2, abdomen and arm left    TENDON REPAIR Right    TOTAL KNEE ARTHROPLASTY Right 07/23/2023   Procedure: TOTAL KNEE ARTHROPLASTY;  Surgeon: Melodi Lerner, MD;  Location: [facility];  Service: Orthopedics;  Laterality: Right;   TUBAL LIGATION      Family Psychiatric History:   Family History:  Family History  Problem Relation Age of Onset   Diabetes Mother    Prostate cancer Father    Breast cancer Maternal Aunt    Diabetes Sister    Esophageal cancer Sister    Colon cancer Neg Hx    Rectal cancer Neg Hx    Stomach cancer Neg  Hx    Colon polyps Neg Hx     Social History:   Social History   Socioeconomic History   Marital status: Divorced    Spouse name: Not on file   Number of children: 2   Years of education: Not on file   Highest education level: Not on file  Occupational History   Occupation: disabled  Tobacco Use   Smoking status: Never   Smokeless tobacco: Never  Vaping Use   Vaping status: Never Used  Substance and Sexual Activity   Alcohol use: Not Currently   Drug use: Not Currently    Frequency: 1.0 times per week    Types: Marijuana   Sexual activity: Not Currently  Other Topics Concern   Not on file  Social History Narrative   Not on file   Social Drivers of Health   Financial Resource Strain: Low Risk  (09/10/2023)   Received from [facility]   Overall Financial Resource Strain (CARDIA)    Difficulty of Paying Living Expenses: Not hard at all  Food Insecurity: No Food Insecurity (09/10/2023)   Received from [facility]   Hunger Vital Sign    Within the past 12 months, you worried that your food would run out before you got the money to buy more.: Never true    Within the past 12 months, the food you bought just didn't last and you didn't have money to get more.: Never true  Transportation Needs: No Transportation Needs (09/10/2023)   Received from [facility] - Transportation    Lack of Transportation (Medical): No    Lack of Transportation (Non-Medical): No  Recent Concern: Transportation Needs - Unmet Transportation Needs (07/23/2023)   PRAPARE - Transportation    Lack of Transportation (Medical): Yes    Lack of Transportation (Non-Medical): Yes  Physical Activity: Sufficiently Active (06/06/2023)   Received from [facility]   Exercise Vital Sign    On average, how many days per week do you engage in moderate to strenuous exercise (like a brisk walk)?: 5 days    On average, how many minutes do you engage in exercise at this level?: 30 min  Stress: Stress  Concern Present (06/06/2023)   Received from [facility] - Occupational Stress Questionnaire    Feeling of Stress : Very much  Social Connections: Socially Isolated (06/06/2023)   Received from [facility]   Social Network    How would you rate your social network (family, work, friends)?: Little  participation, lonely and socially isolated    Additional Social History:   Allergies:   Allergies  Allergen Reactions   Penicillins Anaphylaxis   Haemophilus Influenzae Vaccines Rash   Mirtazapine Other (See Comments)    Pt states this medication made her sleep walk.   Darvon [Propoxyphene] Other (See Comments)    Unknown reaction   Sulfonamide Derivatives Other (See Comments)    Stomach Spasms    Codeine Itching and Rash   Ivp Dye [Iodinated Contrast Media] Rash    Metabolic Disorder Labs: Lab Results  Component Value Date   HGBA1C 5.6 07/23/2023   MPG 114.02 07/23/2023   No results found for: PROLACTIN No results found for: CHOL, TRIG, HDL, CHOLHDL, VLDL, LDLCALC Lab Results  Component Value Date   TSH 0.81 11/17/2009    Therapeutic Level Labs: No results found for: LITHIUM No results found for: CBMZ No results found for: VALPROATE  Current Medications: Current Outpatient Medications  Medication Sig Dispense Refill   ALPRAZolam  (XANAX ) 0.25 MG tablet 1  TID 90 tablet 4   cyclobenzaprine  (FLEXERIL ) 10 MG tablet Take 1 tablet (10 mg total) by mouth 3 (three) times daily as needed for muscle spasms. 40 tablet 0   dextromethorphan-guaiFENesin (MUCINEX DM) 30-600 MG 12hr tablet Take 1 tablet by mouth 2 (two) times daily.     dicyclomine  (BENTYL ) 10 MG capsule Take 10 mg by mouth 2 (two) times daily.     escitalopram  (LEXAPRO ) 10 MG tablet Take 1 tablet (10 mg total) by mouth daily. 30 tablet 5   fluocinonide  (LIDEX ) 0.05 % external solution Apply 1 Application topically daily.     gabapentin  (NEURONTIN ) 300  MG capsule 1 qam  1 q midday 2 qhs 120 capsule 4   hydrOXYzine  (VISTARIL ) 25 MG capsule 2 qam  2  qhs 120 capsule 5   ipratropium (ATROVENT ) 0.06 % nasal spray Place 2 sprays into both nostrils 2 (two) times daily.     latanoprost  (XALATAN ) 0.005 % ophthalmic solution Place 1 drop into both eyes at bedtime.     montelukast  (SINGULAIR ) 10 MG tablet Take 10 mg by mouth daily.     Multiple Vitamins-Minerals (MULTIVITAMIN WITH MINERALS) tablet Take 1 tablet by mouth daily.     oxyCODONE  (OXY IR/ROXICODONE ) 5 MG immediate release tablet Take 1-2 tablets (5-10 mg total) by mouth every 6 (six) hours as needed for severe pain (pain score 7-10). 42 tablet 0   PATADAY 0.2 % SOLN Place 1 drop into both eyes 2 (two) times daily.     pregabalin  (LYRICA ) 25 MG capsule Take 25 mg by mouth 3 (three) times daily.     risankizumab-rzaa (SKYRIZI) 150 MG/ML SOSY prefilled syringe Inject 150 mg into the skin every 3 (three) months.     No current facility-administered medications for this visit.    Musculoskeletal: Strength & Muscle Tone: Normal gait & Station: normal Patient leans: N/A  Psychiatric Specialty Exam: ROS  Blood pressure 138/88, pulse 77, height 5' 2 (1.575 m), weight 174 lb (78.9 kg), SpO2 100%.Body mass index is 31.83 kg/m.  General Appearance: Casual  Eye Contact:  Good  Speech:  Clear and Coherent  Volume:  Normal  Mood:  Negative  Affect:  Congruent  Thought Process:  Goal Directed  Orientation:  Full (Time, Place, and Person)  Thought Content:  WDL  Suicidal Thoughts:  No  Homicidal Thoughts:  No  Memory:  Negative  Judgement:  Fair  Insight:  Fair  Psychomotor Activity:  Normal  Concentration:    Recall:  Poor  Fund of Knowledge:Poor  Language: Fair  Akathisia:  No  Handed:  Right  AIMS (if indicated):  not done  Assets:  Desire for Improvement  ADL's:    Cognition: Impaired,  Mild  Sleep:  Fair   Screenings: Flowsheet Row Admission (Discharged) from 07/23/2023 in  [facility] from 03/18/2022 in [facility] at [facility]  C-SSRS RISK CATEGORY No Risk No Risk    Assessment and Plan: 03/18/2024  This patient's diagnosis is generalized anxiety disorder.  She continues taking Lexapro  10 mg.  She also takes Vistaril  and today we are going to slightly increase the dose.  She was taking 50 mg 3 times daily and today we will increase it to 2 in the morning and 2 at night.  She also takes a lower dose of Xanax  0.25 mg 1 3 times daily.  She also takes gabapentin  for anxiety.  She takes 3 mg 1 in the morning and 2 at night.  Overall this patient is stable.

## 2024-04-07 ENCOUNTER — Ambulatory Visit (HOSPITAL_COMMUNITY): Admitting: Licensed Clinical Social Worker

## 2024-04-29 ENCOUNTER — Ambulatory Visit (INDEPENDENT_AMBULATORY_CARE_PROVIDER_SITE_OTHER): Admitting: Licensed Clinical Social Worker

## 2024-04-29 DIAGNOSIS — F411 Generalized anxiety disorder: Secondary | ICD-10-CM | POA: Diagnosis not present

## 2024-04-29 DIAGNOSIS — F329 Major depressive disorder, single episode, unspecified: Secondary | ICD-10-CM

## 2024-04-29 DIAGNOSIS — F4312 Post-traumatic stress disorder, chronic: Secondary | ICD-10-CM | POA: Diagnosis not present
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
## 2024-08-13 ENCOUNTER — Ambulatory Visit (HOSPITAL_COMMUNITY): Admitting: Licensed Clinical Social Worker

## 2024-08-13 DIAGNOSIS — F329 Major depressive disorder, single episode, unspecified: Secondary | ICD-10-CM | POA: Diagnosis not present

## 2024-08-13 DIAGNOSIS — F4312 Post-traumatic stress disorder, chronic: Secondary | ICD-10-CM | POA: Diagnosis not present

## 2024-08-13 DIAGNOSIS — F411 Generalized anxiety disorder: Secondary | ICD-10-CM

## 2024-08-13 NOTE — Progress Notes (Signed)
 Due to technological difficulties more than 50 % done by phone   Virtual Visit via Video Note  I connected with Carla Tran on 08/13/2024 at 11:00 AM EST by a video enabled telemedicine application and verified that I am speaking with the correct person using two identifiers.  Location: Patient: home Provider: home office   I discussed the limitations of evaluation and management by telemedicine and the availability of in person appointments. The patient expressed understanding and agreed to proceed.  I discussed the assessment and treatment plan with the patient. The patient was provided an opportunity to ask questions and all were answered. The patient agreed with the plan and demonstrated an understanding of the instructions.   The patient was advised to call back or seek an in-person evaluation if the symptoms worsen or if the condition fails to improve as anticipated.  I provided 45 minutes of non-face-to-face time during this encounter.  THERAPIST PROGRESS NOTE  Session Time: 11:00 AM to 11:45 AM  Participation Level: Active  Behavioral Response: CasualAlertEuthymic  Type of Therapy: Individual Therapy  Treatment Goals addressed: Patient continue to work on depression, help her managing stressors of medical issues, continue to address trauma, anxiety, patient added getting her medications straightened, coping.    ProgressTowards Goals: Progressing-patient having confusion explored this with her possible causes and steps to take, talked about stressors in general as well as updates about positives going on with her  Interventions: Solution Focused, Strength-based, Supportive, and Other: coping  Summary: Carla Tran is a 73 y.o. female who presents with Christmas better saw everyone but not son and grandson. Issue with son he is back to drinking.  Patient wanted to talk to therapist about her concern about confusion. She doesn't feel balanced, normal doesn't not to  remember everything, confused with  family members and names.  Therapist for said neuropsych talk to primary care for for screening patient mention sleepwalking doing that again.  Therapist not a sleep expert but looked it up and said reasons to be concerned when episodes are frequent or involve dangerous behaviors, confusion lasts longer than a few minutes, there are other symptoms like memory loss during the day hallucination or personality change. Patient reporting confusion lasting longer than a few minutes.  Therapist said although there could be other sources for this the first step would be to talk to primary care who can do preliminary screenings and do referral to specialist. Patient says has nightmares as well either nothing or nightmares.therapist believes that would be more mental health issue.  Patient says wants to stay home not come in for appointments but do them virtually. Do virtual appointments keep her mind busy do things around the house to keep her muscles from going flabby. 12/9 knee looking good use walker one week and walking around. Needs to remind herself to slow down hyper tell herself that.  Has a caretaker coming therapist thinks it is really good for her not just to help with the interaction another positive is that Earnie gets her walking. She comes Tuesday Friday 9-12 four days and she is really good patient says. Patient shared a humorous story at her daughter's have stepped slipped and fell in the bushes positive side of her mental health was able to laugh.  Therapist said glad she is not her was not funny at the time but now something that home will do.  Therapist left to very funny.  In general good connection therapeutic for sessions for patient for various stressors  events that happened in her life a place to share and processed feelings  Suicidal/Homicidal: No  Plan: Return again in 11/04/24 at 10 AM. 2.  Processed thoughts and feelings in session help with stressors also  focus on positives that help with mood management  Diagnosis: Major depressive disorder chronic,  PTSD chronic, GAD  Collaboration of Care: Other none needed  Patient/Guardian was advised Release of Information must be obtained prior to any record release in order to collaborate their care with an outside provider. Patient/Guardian was advised if they have not already done so to contact the registration department to sign all necessary forms in order for us  to release information regarding their care.   Consent: Patient/Guardian gives verbal consent for treatment and assignment of benefits for services provided during this visit. Patient/Guardian expressed understanding and agreed to proceed.   Ronal Sink, LCSW 08/13/2024

## 2024-08-20 ENCOUNTER — Encounter (HOSPITAL_COMMUNITY): Payer: Self-pay | Admitting: Psychiatry

## 2024-08-20 ENCOUNTER — Ambulatory Visit (HOSPITAL_COMMUNITY): Admitting: Psychiatry

## 2024-08-20 ENCOUNTER — Other Ambulatory Visit: Payer: Self-pay

## 2024-08-20 VITALS — BP 134/78 | HR 89 | Ht 62.5 in | Wt 176.0 lb

## 2024-08-20 DIAGNOSIS — F411 Generalized anxiety disorder: Secondary | ICD-10-CM | POA: Diagnosis not present

## 2024-08-20 MED ORDER — ESCITALOPRAM OXALATE 10 MG PO TABS: 10.0000 mg | ORAL_TABLET | Freq: Every day | ORAL | 5 refills | Status: AC

## 2024-08-20 MED ORDER — HYDROXYZINE PAMOATE 25 MG PO CAPS: ORAL_CAPSULE | ORAL | 5 refills | Status: AC

## 2024-08-20 NOTE — Progress Notes (Signed)
 " Psychiatric Initial Adult Assessment   Patient Identification: Carla Tran MRN:  [id number] Date of Evaluation:  08/20/2024 Referral Source: Dr. Gwynneth Chief Complaint:    Visit Diagnosis: Major depression chronic  History of Present    Today's date is August 20, 2024.  Today this patient is actually doing pretty well.  Her mood is good.  Her anxiety seems to be well-controlled.  She has a home health aide care agency that comes a few hours nearly every day.  The patient denies depression her mood is good she is sleeping and eating well.  She takes her medicines as prescribed.  Decrease her Vistaril  to 25 mg to twice daily has been helpful.  Her anxiety is well relatively well-controlled.  Location: Patient: home Provider: office   I discussed the limitations, risks, security and privacy concerns of performing an evaluation and management service by telephone and the availability of in person appointments. I also discussed with the patient that there may be a patient responsible charge related to this service. The patient expressed understanding and agreed to proceed. :    I discussed the assessment and treatment plan with the patient. The patient was provided an opportunity to ask questions and all were answered. The patient agreed with the plan and demonstrated an understanding of the instructions.   The patient was advised to call back or seek an in-person evaluation if the symptoms worsen or if the condition fails to improve as anticipated.  I provided 30 minutes of non-face-to-face time during this encounter.   Elna LILLETTE Lo, MD   Depression Symptoms:  disturbed sleep, (Hypo) Manic Symptoms:   Anxiety Symptoms:   Psychotic Symptoms:   PTSD Symptoms:   Past Psychiatric History: Under the care of Dr. Joshua and hospitalized for alcohol treatment  Previous Psychotropic Medications: Xanax  0.5 mg 4 times daily, Celexa  40 mg, Seroquel unclear dose  Substance Abuse  History in the last 12 months:  Yes.    Consequences of Substance Abuse:   Past Medical History:  Past Medical History:  Diagnosis Date   Allergy    Anginal pain    Anxiety    Asthma    Bursitis    shoulder   Cataract    forming    Cervical cancer (HCC)    Depression    DJD (degenerative joint disease)    Dyspnea    Dysrhythmia    Endometriosis    Fibromyalgia    Glaucoma    Head ache    Heart murmur    Hypertension    Irregular heart beat    PTSD (post-traumatic stress disorder)    PTSD (post-traumatic stress disorder)    Seizures (HCC)    x 1 only- in early 2000's and none since    Skin cancer    Stroke [facility])     Past Surgical History:  Procedure Laterality Date   BACK SURGERY     CERVICAL FUSION  2000   C4-5   CHOLECYSTECTOMY     COLONOSCOPY     ligaments removed from elbow     PARTIAL HYSTERECTOMY     SKIN CANCER EXCISION     skin cancer removed     from face x 2, abdomen and arm left    TENDON REPAIR Right    TOTAL KNEE ARTHROPLASTY Right 07/23/2023   Procedure: TOTAL KNEE ARTHROPLASTY;  Surgeon: Melodi Lerner, MD;  Location: [facility];  Service: Orthopedics;  Laterality: Right;   TUBAL LIGATION  Family Psychiatric History:   Family History:  Family History  Problem Relation Age of Onset   Diabetes Mother    Prostate cancer Father    Breast cancer Maternal Aunt    Diabetes Sister    Esophageal cancer Sister    Colon cancer Neg Hx    Rectal cancer Neg Hx    Stomach cancer Neg Hx    Colon polyps Neg Hx     Social History:   Social History   Socioeconomic History   Marital status: Divorced    Spouse name: Not on file   Number of children: 2   Years of education: Not on file   Highest education level: Not on file  Occupational History   Occupation: disabled  Tobacco Use   Smoking status: Never   Smokeless tobacco: Never  Vaping Use   Vaping status: Never Used  Substance and Sexual Activity   Alcohol use: Not Currently   Drug  use: Not Currently    Frequency: 1.0 times per week    Types: Marijuana   Sexual activity: Not Currently  Other Topics Concern   Not on file  Social History Narrative   Not on file   Social Drivers of Health   Tobacco Use: Low Risk (08/20/2024)   Patient History    Smoking Tobacco Use: Never    Smokeless Tobacco Use: Never    Passive Exposure: Not on file  Financial Resource Strain: Low Risk (09/10/2023)   Received from [facility]   Overall Financial Resource Strain (CARDIA)    Difficulty of Paying Living Expenses: Not hard at all  Food Insecurity: No Food Insecurity (09/10/2023)   Received from [facility]   Epic    Within the past 12 months, you worried that your food would run out before you got the money to buy more.: Never true    Within the past 12 months, the food you bought just didn't last and you didn't have money to get more.: Never true  Transportation Needs: No Transportation Needs (09/10/2023)   Received from [facility] - Transportation    Lack of Transportation (Medical): No    Lack of Transportation (Non-Medical): No  Recent Concern: Transportation Needs - Unmet Transportation Needs (07/23/2023)   PRAPARE - Transportation    Lack of Transportation (Medical): Yes    Lack of Transportation (Non-Medical): Yes  Physical Activity: Sufficiently Active (06/06/2023)   Received from [facility]   Exercise Vital Sign    On average, how many days per week do you engage in moderate to strenuous exercise (like a brisk walk)?: 5 days    On average, how many minutes do you engage in exercise at this level?: 30 min  Stress: Stress Concern Present (06/06/2023)   Received from [facility] of Occupational [facility] - Occupational Stress Questionnaire    Feeling of Stress : Very much  Social Connections: Socially Isolated (06/06/2023)   Received from [facility]   Social Network    How would you rate your social network (family, work,  friends)?: Little participation, lonely and socially isolated  Depression (PHQ2-9): Not on file  Alcohol Screen: Not on file  Housing: Low Risk (09/10/2023)   [facility]    In the last 12 months, was there a time when you were not able to pay the mortgage or rent on time?: No    In the past 12 months, how many times have you  moved where you were living?: 0    At any time in the past 12 months, were you homeless or living in a shelter (including now)?: No  Utilities: Not At Risk (09/10/2023)   Received from [facility] Utilities    Threatened with loss of utilities: No  Health Literacy: Not on file    Additional Social History:   Allergies:   Allergies  Allergen Reactions   Penicillins Anaphylaxis   Haemophilus Influenzae Vaccines Rash   Mirtazapine Other (See Comments)    Pt states this medication made her sleep walk.   Darvon [Propoxyphene] Other (See Comments)    Unknown reaction   Sulfonamide Derivatives Other (See Comments)    Stomach Spasms    Codeine Itching and Rash   Ivp Dye [Iodinated Contrast Media] Rash    Metabolic Disorder Labs: Lab Results  Component Value Date   HGBA1C 5.6 07/23/2023   MPG 114.02 07/23/2023   No results found for: PROLACTIN No results found for: CHOL, TRIG, HDL, CHOLHDL, VLDL, LDLCALC Lab Results  Component Value Date   TSH 0.81 11/17/2009    Therapeutic Level Labs: No results found for: LITHIUM No results found for: CBMZ No results found for: VALPROATE  Current Medications: Current Outpatient Medications  Medication Sig Dispense Refill   ALPRAZolam  (XANAX ) 0.25 MG tablet 1  TID 90 tablet 4   cyclobenzaprine  (FLEXERIL ) 10 MG tablet Take 1 tablet (10 mg total) by mouth 3 (three) times daily as needed for muscle spasms. 40 tablet 0   dextromethorphan-guaiFENesin (MUCINEX DM) 30-600 MG 12hr tablet Take 1 tablet by mouth 2 (two) times daily.     dicyclomine  (BENTYL ) 10 MG capsule  Take 10 mg by mouth 2 (two) times daily.     fluocinonide  (LIDEX ) 0.05 % external solution Apply 1 Application topically daily.     gabapentin  (NEURONTIN ) 300 MG capsule 1 qam  1 q midday 2 qhs 120 capsule 4   ipratropium (ATROVENT ) 0.06 % nasal spray Place 2 sprays into both nostrils 2 (two) times daily.     latanoprost  (XALATAN ) 0.005 % ophthalmic solution Place 1 drop into both eyes at bedtime.     montelukast  (SINGULAIR ) 10 MG tablet Take 10 mg by mouth daily.     Multiple Vitamins-Minerals (MULTIVITAMIN WITH MINERALS) tablet Take 1 tablet by mouth daily.     oxyCODONE  (OXY IR/ROXICODONE ) 5 MG immediate release tablet Take 1-2 tablets (5-10 mg total) by mouth every 6 (six) hours as needed for severe pain (pain score 7-10). 42 tablet 0   PATADAY 0.2 % SOLN Place 1 drop into both eyes 2 (two) times daily.     pregabalin  (LYRICA ) 25 MG capsule Take 25 mg by mouth 3 (three) times daily.     risankizumab-rzaa (SKYRIZI) 150 MG/ML SOSY prefilled syringe Inject 150 mg into the skin every 3 (three) months.     escitalopram  (LEXAPRO ) 10 MG tablet Take 1 tablet (10 mg total) by mouth daily. 30 tablet 5   hydrOXYzine  (VISTARIL ) 25 MG capsule 2 qam  2  qhs 120 capsule 5   No current facility-administered medications for this visit.    Musculoskeletal: Strength & Muscle Tone: Normal gait & Station: normal Patient leans: N/A  Psychiatric Specialty Exam: ROS  Blood pressure 134/78, pulse 89, height 5' 2.5 (1.588 m), weight 176 lb (79.8 kg).Body mass index is 31.68 kg/m.  General Appearance: Casual  Eye Contact:  Good  Speech:  Clear and Coherent  Volume:  Normal  Mood:  Negative  Affect:  Congruent  Thought Process:  Goal Directed  Orientation:  Full (Time, Place, and Person)  Thought Content:  WDL  Suicidal Thoughts:  No  Homicidal Thoughts:  No  Memory:  Negative  Judgement:  Fair  Insight:  Fair  Psychomotor Activity:  Normal  Concentration:    Recall:  Poor  Fund of Knowledge:Poor   Language: Fair  Akathisia:  No  Handed:  Right  AIMS (if indicated):  not done  Assets:  Desire for Improvement  ADL's:    Cognition: Impaired,  Mild  Sleep:  Fair   Screenings: Flowsheet Row Admission (Discharged) from 07/23/2023 in [place] [facility] from [facility]/18/2022 in [facility] at [facility]  C-SSRS RISK CATEGORY No Risk No Risk    Assessment and Plan: 08/20/2024  This patient's diagnosis is generalized anxiety disorder in remission.  She continues taking Lexapro  10 mg a day.  To clarify an error on her last visit she actually was on the 25 mg pill of Vistaril  and we creased it from 3 times daily to twice daily.  The patient is doing well on this dose of Vistaril  essentially 100 mg a day.  Patient will return to see me in 5 months.  She is very stable at this time.  She lives independently. "

## 2024-11-04 ENCOUNTER — Ambulatory Visit (HOSPITAL_COMMUNITY): Admitting: Licensed Clinical Social Worker

## 2025-01-20 ENCOUNTER — Ambulatory Visit (HOSPITAL_COMMUNITY): Admitting: Psychiatry
